# Patient Record
Sex: Female | Born: 1975
Health system: Southern US, Community
[De-identification: ages and names within clinical notes are randomized; demographics above are authoritative.]

## PROBLEM LIST (undated history)

## (undated) DIAGNOSIS — E7800 Pure hypercholesterolemia, unspecified: Secondary | ICD-10-CM

## (undated) DIAGNOSIS — E119 Type 2 diabetes mellitus without complications: Secondary | ICD-10-CM

## (undated) DIAGNOSIS — I10 Essential (primary) hypertension: Secondary | ICD-10-CM

---

## 2011-05-20 ENCOUNTER — Emergency Department (HOSPITAL_COMMUNITY)
Admission: EM | Admit: 2011-05-20 | Discharge: 2011-05-20 | Disposition: A | Discharge status: Home / Self Care | Payer: Medicaid Other | Attending: Emergency Medicine | Admitting: Emergency Medicine

## 2011-05-20 DIAGNOSIS — L03211 Cellulitis of face: Secondary | ICD-10-CM | POA: Insufficient documentation

## 2011-05-20 DIAGNOSIS — E119 Type 2 diabetes mellitus without complications: Secondary | ICD-10-CM | POA: Insufficient documentation

## 2011-05-20 DIAGNOSIS — H60399 Other infective otitis externa, unspecified ear: Secondary | ICD-10-CM | POA: Insufficient documentation

## 2011-05-20 DIAGNOSIS — L0201 Cutaneous abscess of face: Secondary | ICD-10-CM | POA: Insufficient documentation

## 2011-05-20 LAB — URINALYSIS, ROUTINE W REFLEX MICROSCOPIC
Bilirubin Urine: NEGATIVE
Hgb urine dipstick: NEGATIVE
Specific Gravity, Urine: 1.043 — ABNORMAL HIGH (ref 1.005–1.030)
pH: 6 (ref 5.0–8.0)

## 2011-05-20 LAB — URINE MICROSCOPIC-ADD ON

## 2011-05-20 LAB — CBC
HCT: 44.9 % (ref 36.0–46.0)
MCHC: 34.5 g/dL (ref 30.0–36.0)
Platelets: 276 10*3/uL (ref 150–400)
RDW: 12.1 % (ref 11.5–15.5)
WBC: 8.3 10*3/uL (ref 4.0–10.5)

## 2011-05-20 LAB — DIFFERENTIAL
Basophils Absolute: 0 10*3/uL (ref 0.0–0.1)
Basophils Relative: 0 % (ref 0–1)
Eosinophils Absolute: 0.2 10*3/uL (ref 0.0–0.7)
Eosinophils Relative: 2 % (ref 0–5)
Lymphocytes Relative: 27 % (ref 12–46)
Monocytes Absolute: 0.7 10*3/uL (ref 0.1–1.0)

## 2011-05-20 LAB — BASIC METABOLIC PANEL
BUN: 8 mg/dL (ref 6–23)
Calcium: 10.2 mg/dL (ref 8.4–10.5)
GFR calc Af Amer: >90 mL/min (ref >90)
GFR calc non Af Amer: >90 mL/min (ref >90)
Glucose, Bld: 348 mg/dL — ABNORMAL HIGH (ref 70–99)
Potassium: 4 mEq/L (ref 3.5–5.1)
Sodium: 133 mEq/L — ABNORMAL LOW (ref 135–145)

## 2011-05-20 LAB — GLUCOSE, CAPILLARY
Glucose-Capillary: 387 mg/dL — ABNORMAL HIGH (ref 70–99)
Glucose-Capillary: 374 mg/dL — ABNORMAL HIGH (ref 70–99)

## 2011-05-20 LAB — POCT PREGNANCY, URINE: Preg Test, Ur: NEGATIVE

## 2013-01-27 DIAGNOSIS — E1159 Type 2 diabetes mellitus with other circulatory complications: Secondary | ICD-10-CM | POA: Insufficient documentation

## 2013-01-27 DIAGNOSIS — F17200 Nicotine dependence, unspecified, uncomplicated: Secondary | ICD-10-CM | POA: Insufficient documentation

## 2013-01-27 DIAGNOSIS — K047 Periapical abscess without sinus: Secondary | ICD-10-CM | POA: Insufficient documentation

## 2013-01-27 DIAGNOSIS — K120 Recurrent oral aphthae: Secondary | ICD-10-CM | POA: Insufficient documentation

## 2013-01-27 NOTE — ED Notes (Signed)
The pt has had tooth and mouth pain for 3 days and she has been unable to eat until she arrived here now eating pretzels

## 2013-01-28 ENCOUNTER — Emergency Department (HOSPITAL_COMMUNITY)
Admission: EM | Admit: 2013-01-28 | Discharge: 2013-01-28 | Disposition: A | Discharge status: Home / Self Care | Payer: Medicaid Other | Attending: Emergency Medicine | Admitting: Emergency Medicine

## 2013-01-28 ENCOUNTER — Encounter (HOSPITAL_COMMUNITY): Payer: Self-pay | Admitting: *Deleted

## 2013-01-28 DIAGNOSIS — K047 Periapical abscess without sinus: Secondary | ICD-10-CM

## 2013-01-28 HISTORY — DX: Type 2 diabetes mellitus without complications: E11.9

## 2013-01-28 MED ORDER — HYDROCODONE-ACETAMINOPHEN 5-325 MG PO TABS: 1.0000 | ORAL_TABLET | Freq: Four times a day (QID) | ORAL | Status: DC | PRN

## 2013-01-28 MED ORDER — PENICILLIN V POTASSIUM 500 MG PO TABS: 500.0000 mg | ORAL_TABLET | Freq: Four times a day (QID) | ORAL | Status: DC

## 2013-01-28 MED ORDER — HYDROCODONE-ACETAMINOPHEN 5-325 MG PO TABS
1.0000 | ORAL_TABLET | Freq: Once | ORAL | Status: AC
  Administered 2013-01-28: 1 via ORAL
  Filled 2013-01-28: qty 1

## 2013-01-28 MED ORDER — PENICILLIN V POTASSIUM 250 MG PO TABS
500.0000 mg | ORAL_TABLET | Freq: Once | ORAL | Status: AC
  Administered 2013-01-28: 500 mg via ORAL
  Filled 2013-01-28: qty 2

## 2013-01-28 NOTE — ED Provider Notes (Signed)
Medical screening examination/treatment/procedure(s) were performed by non-physician practitioner and as supervising physician I was immediately available for consultation/collaboration.  Lyanne Co, MD 01/28/13 [phone]

## 2013-01-28 NOTE — ED Provider Notes (Signed)
   History    CSN: [id number] Arrival date & time 01/27/13  2341  First MD Initiated Contact with Patient 01/28/13 0005     Chief Complaint  Patient presents with  . Dental Problem   (Consider location/radiation/quality/duration/timing/severity/associated sxs/prior Treatment) HPI Comments: Patient with swollen gum and painful for the past 3 days   The history is provided by the patient.   Past Medical History  Diagnosis Date  . Diabetes mellitus without complication    History reviewed. No pertinent past surgical history. No family history on file. History  Substance Use Topics  . Smoking status: Current Every Day Smoker  . Smokeless tobacco: Not on file  . Alcohol Use: Yes   OB History   Grav Para Term Preterm Abortions TAB SAB Ect Mult Living                 Review of Systems  HENT: Positive for mouth sores and dental problem. Negative for neck pain.   All other systems reviewed and are negative.    Allergies  Review of patient's allergies indicates not on file.  Home Medications   Current Outpatient Rx  Name  Route  Sig  Dispense  Refill  . HYDROcodone-acetaminophen (NORCO/VICODIN) 5-325 MG per tablet   Oral   Take 1 tablet by mouth every 6 (six) hours as needed for pain.   17 tablet   0   . penicillin v potassium (VEETID) 500 MG tablet   Oral   Take 1 tablet (500 mg total) by mouth 4 (four) times daily.   39 tablet   0    BP 99/56  Pulse 100  Temp(Src) 99.4 F (37.4 C)  Resp 20  SpO2 99% Physical Exam  Nursing note and vitals reviewed. Constitutional: She is oriented to person, place, and time. She appears well-developed and well-nourished.  HENT:  Head: Normocephalic.  Mouth/Throat: Oropharynx is clear and moist.    Eyes: Pupils are equal, round, and reactive to light.  Neck: Normal range of motion.  Cardiovascular: Normal rate.   Pulmonary/Chest: Effort normal.  Abdominal: Soft.  Musculoskeletal: Normal range of motion.    Neurological: She is alert and oriented to person, place, and time.  Skin: Skin is warm and dry.    ED Course  Procedures (including critical care time) Labs Reviewed - No data to display No results found. 1. Dental abscess     MDM  Dental abscess   Arman Filter, NP 01/28/13 0100

## 2014-11-10 ENCOUNTER — Encounter (HOSPITAL_COMMUNITY): Payer: Self-pay

## 2014-11-10 ENCOUNTER — Emergency Department (HOSPITAL_COMMUNITY)
Admission: EM | Admit: 2014-11-10 | Discharge: 2014-11-10 | Disposition: A | Discharge status: Home / Self Care | Payer: Medicaid Other | Attending: Emergency Medicine | Admitting: Emergency Medicine

## 2014-11-10 DIAGNOSIS — K047 Periapical abscess without sinus: Secondary | ICD-10-CM | POA: Insufficient documentation

## 2014-11-10 DIAGNOSIS — Z720 Tobacco use: Secondary | ICD-10-CM | POA: Insufficient documentation

## 2014-11-10 DIAGNOSIS — E119 Type 2 diabetes mellitus without complications: Secondary | ICD-10-CM | POA: Insufficient documentation

## 2014-11-10 MED ORDER — IBUPROFEN 800 MG PO TABS
800.0000 mg | ORAL_TABLET | Freq: Three times a day (TID) | ORAL | Status: DC | PRN
Start: 2014-11-10 — End: 2015-04-10

## 2014-11-10 MED ORDER — IBUPROFEN 400 MG PO TABS
600.0000 mg | ORAL_TABLET | Freq: Once | ORAL | Status: AC
  Administered 2014-11-10: 600 mg via ORAL
  Filled 2014-11-10: qty 2

## 2014-11-10 MED ORDER — PENICILLIN V POTASSIUM 500 MG PO TABS: 500.0000 mg | ORAL_TABLET | Freq: Four times a day (QID) | ORAL | Status: AC

## 2014-11-10 MED ORDER — HYDROCODONE-ACETAMINOPHEN 5-325 MG PO TABS: 1.0000 | ORAL_TABLET | ORAL | Status: DC | PRN

## 2014-11-10 NOTE — Discharge Instructions (Signed)
Read the information below.  Use the prescribed medication as directed.  Please discuss all new medications with your pharmacist.  Do not take additional tylenol while taking the prescribed pain medication to avoid overdose.  You may return to the Emergency Department at any time for worsening condition or any new symptoms that concern you.    If there is any possibility that you might be pregnant, please let your health care provider know and discuss this with the pharmacist to ensure medication safety.  Please call the dentist listed above within 48 hours to schedule a close follow up appointment.  If you develop fevers, swelling in your face, difficulty swallowing or breathing, return to the ER immediately for a recheck.   ° ° °Dental Abscess °A dental abscess is a collection of infected fluid (pus) from a bacterial infection in the inner part of the tooth (pulp). It usually occurs at the end of the tooth's root.  °CAUSES  °· Severe tooth decay. °· Trauma to the tooth that allows bacteria to enter into the pulp, such as a broken or chipped tooth. °SYMPTOMS  °· Severe pain in and around the infected tooth. °· Swelling and redness around the abscessed tooth or in the mouth or face. °· Tenderness. °· Pus drainage. °· Bad breath. °· Bitter taste in the mouth. °· Difficulty swallowing. °· Difficulty opening the mouth. °· Nausea. °· Vomiting. °· Chills. °· Swollen neck glands. °DIAGNOSIS  °· A medical and dental history will be taken. °· An examination will be performed by tapping on the abscessed tooth. °· X-rays may be taken of the tooth to identify the abscess. °TREATMENT °The goal of treatment is to eliminate the infection. You may be prescribed antibiotic medicine to stop the infection from spreading. A root canal may be performed to save the tooth. If the tooth cannot be saved, it may be pulled (extracted) and the abscess may be drained.  °HOME CARE INSTRUCTIONS °· Only take over-the-counter or prescription  medicines for pain, fever, or discomfort as directed by your caregiver. °· Rinse your mouth (gargle) often with salt water (¼ tsp salt in 8 oz [250 ml] of warm water) to relieve pain or swelling. °· Do not drive after taking pain medicine (narcotics). °· Do not apply heat to the outside of your face. °· Return to your dentist for further treatment as directed. °SEEK MEDICAL CARE IF: °· Your pain is not helped by medicine. °· Your pain is getting worse instead of better. °SEEK IMMEDIATE MEDICAL CARE IF: °· You have a fever or persistent symptoms for more than 2-3 days. °· You have a fever and your symptoms suddenly get worse. °· You have chills or a very bad headache. °· You have problems breathing or swallowing. °· You have trouble opening your mouth. °· You have swelling in the neck or around the eye. °Document Released: 07/18/2005 Document Revised: 04/11/2012 Document Reviewed: 10/26/2010 °ExitCare® Patient Information ©2015 [employer]. This information is not intended to replace advice given to you by your health care provider. Make sure you discuss any questions you have with your health care provider. ° ° ° °Emergency Department Resource Guide °1) Find a Doctor and Pay Out of Pocket °Although you won't have to find out who is covered by your insurance plan, it is a good idea to ask around and get recommendations. You will then need to call the office and see if the doctor you have chosen will accept you as a new patient and   what types of options they offer for patients who are self-pay. Some doctors offer discounts or will set up payment plans for their patients who do not have insurance, but you will need to ask so you aren't surprised when you get to your appointment. ° °2) Contact Your Local Health Department °Not all health departments have doctors that can see patients for sick visits, but many do, so it is worth a call to see if yours does. If you don't know where your local health department is, you  can check in your phone book. The CDC also has a tool to help you locate your state's health department, and many state websites also have listings of all of their local health departments. ° °3) Find a Walk-in Clinic °If your illness is not likely to be very severe or complicated, you may want to try a walk in clinic. These are popping up all over the country in pharmacies, drugstores, and shopping centers. They're usually staffed by nurse practitioners or physician assistants that have been trained to treat common illnesses and complaints. They're usually fairly quick and inexpensive. However, if you have serious medical issues or chronic medical problems, these are probably not your best option. ° °No Primary Care Doctor: °- Call [facility] at  832-8000 - they can help you locate a primary care doctor that  accepts your insurance, provides certain services, etc. °- Physician Referral Service- 1-800-533-3463 ° °Chronic Pain Problems: °Organization         Address  Phone   Notes  °[facility]  (336) 297-2271 Patients need to be referred by their primary care doctor.  ° °Medication Assistance: °Organization         Address  Phone   Notes  °[place] [facility] [address] °[place], [place] [address] (336) 641-8030 --Must be a resident of [place] °-- Must have NO insurance coverage whatsoever (no Medicaid/ Medicare, etc.) °-- The pt. MUST have a primary care doctor that directs their care regularly and follows them in the community °  °[facility]  (866) 331-1348   °[employer]  (888) 892-1162   ° °Agencies that provide inexpensive medical care: °Organization         Address  Phone   Notes  °[facility]  (336) 832-8035   °[facility]    (336) 832-7272   °[facility] [address] °[place],  [address] (336) 832-4777   °[facility] [place] [address], °[place] (336)  271-4999   °[facility]    (336) 373-0678   °[facility]    (336) 272-1050   °[facility] ° [address], [place] Phone:  (336) 832-4444, Fax:  (336) 832-4440 Hours of Operation:  9 am - 6 pm, M-F.  Also accepts Medicaid/Medicare and self-pay.  °[facility] ° [address],  Phone: (336) 832-3150, Fax: (336) 832-3151. Hours of Operation:  8:30 am - 5:30 pm, M-F.  Also accepts Medicaid and self-pay.  °[facility] [place] [address], [place] Phone: (336) 878-6027   °[facility] [address], [place],  (336)723-1848, Ext. 123 Mondays & Thursdays: 7-9 AM.  First 15 patients are seen on a first come, first serve basis. °  ° °Medicaid-accepting [place] Providers: ° °Organization         Address  Phone     Notes  °[facility] [address], [place] (336) 641-2100 Also accepts self-pay patients.  °[facility] [address], [place] ° (336) 856-9996   °[facility] [address], [place] (336) 288-8857   °[facility] [address] [place] [address], [place] (336) 299-7000   °Veita Bland [address], [place]  ° (336) 373-1557 Only accepts [place] Access Medicaid patients after they have their name applied to their card.  ° °Self-Pay (no insurance) in [place]: ° °Organization         Address  Phone   Notes  °Sickle Cell Patients, [facility] [address], [place] (336) 832-1970   °[facility] [address], [place] (336) 832-4400   °[facility] ° [address], [place] (336) 992-4800   °[facility]/Dr. Osei-Bonsu ° [address] [place] [address], [place] or [address], [place] (336) 841-8500 Phone number for both [place] and [place] locations  is the same.  °[facility] [address], [place] (336) 299-0000   °[facility] [address] [place] [address], [place] or [address] (336) 852-7530 °(336) 878-2260   °[facility] [address], [place] (336) 350-1642, phone; (336) 294-5005, fax Sees patients 1st and 3rd Saturday of every month.  Must not qualify for public or private insurance (i.e. Medicaid, Medicare, [place] Health Choice, Veterans' Benefits) • Household income should be no more than 200% of the poverty level •The clinic cannot treat you if you are pregnant or think you are pregnant • Sexually transmitted diseases are not treated at the clinic.  ° ° °Dental Care: °Organization         Address  Phone  Notes  °[facility] [address], [place] (336) 641-6152 Accepts children up to age 21 who are enrolled in Medicaid or [place] Health Choice; pregnant women with a Medicaid card; and children who have applied for Medicaid or [place] Health Choice, but were declined, whose parents can pay a reduced fee at time of service.  °[facility] [place]  [address], [place] (336) 641-7733 Accepts children up to age 21 who are enrolled in Medicaid or [place] Health Choice; pregnant women with a Medicaid card; and children who have applied for Medicaid or [place] Health Choice, but were declined, whose parents can pay a reduced fee at time of service.  °[facility] ° [address], [place] (336) 641-4533 Patients are seen by appointment only. Walk-ins are not accepted. [facility] will see patients 18 years of age and older. °Monday - Tuesday (8am-5pm) °Most Wednesdays (8:30-5pm) °$30 per visit, cash only  °[facility] ° [address], [place] (336) 641-4533 Patients are seen by appointment only. Walk-ins are not accepted. [facility] will see  patients 18 years of age and older. °One Wednesday Evening (Monthly: Volunteer Based).  $30 per visit, cash only  °[facility]  (919) 537-3737 for adults; Children under age 4, call Graduate Pediatric Dentistry at (919) [phone]. Children aged 4-14, please call (919) 537-3737 to request a pediatric application. ° Dental services are provided in all areas of dental care including fillings, crowns and bridges, complete   and partial dentures, implants, gum treatment, root canals, and extractions. Preventive care is also provided. Treatment is provided to both adults and children. °Patients are selected via a lottery and there is often a waiting list. °  °[facility] [address], °[place] ° (336) 763-8833 [email or web address] °  °[facility] [address], [place], [place] (336)723-1848, Ext. 123 Second and Fourth Thursday of each month, opens at 6:30 AM; Clinic ends at 9 AM.  Patients are seen on a first-come first-served basis, and a limited number are seen during each clinic.  ° °[facility] ° [address], [place], [place] (336) 723-7904   Eligibility Requirements °You must have lived in [place], [place], or [place] counties for at least the last three months. °  You cannot be eligible for state or federal sponsored healthcare insurance, including Veterans Administration, Medicaid, or Medicare. °  You generally cannot be eligible for healthcare insurance through your employer.  °  How to apply: °Eligibility screenings are held every Tuesday and Wednesday afternoon from 1:00 pm until 4:00 pm. You do not need an appointment for the interview!  °[facility] [address], [place], [place] 336-631-2330   °[facility]  336-342-8273   °[facility]  336-703-3100   °[facility]  336-570-6415   ° °Behavioral Health Resources in the Community: °Intensive Outpatient  Programs °Organization         Address  Phone  Notes  °[facility] [address], [place], [place] 336-878-6098   °[facility] [address], [place], [place] 336-832-9800   °[facility] [address], [place], [place] ° 336-882-2125   °[facility] [address],  °[place], [place] 1-800-853-5163 or 336-641-4981   °Substance Abuse Resources °Organization         Address  Phone  Notes  °[facility]  336-882-2125   °[facility]  336-784-9470   °[facility]  336-285-9073   °[facility]  336-845-3988   °Residential & Outpatient Substance Abuse Program  1-800-659-3381   °Psychological Services °Organization         Address  Phone  Notes  °[facility]  336- 832-9600   °[facility]  336- 378-7881   °[facility] [address], [place] 1-800-853-5163 or 336-641-4981   ° °Mobile Crisis Teams °Organization         Address  Phone  Notes  °[facility], Mobile Crisis Care Unit  1-877-626-1772   °Assertive °[facility] ° [address] [place], [place] 336-834-9664   °Sharon DeEsch [address] °[place] 336-554-5454   ° °Self-Help/Support Groups °Organization         Address  Phone             Notes  °[facility] - variety of support groups  336- 373-1402 Call for more information  °Narcotics Anonymous (NA), [facility] [address], °[place]  2 meetings at this location  ° °Residential Treatment Programs °Organization         Address  Phone  Notes  °[facility] [address],    ° [place]  1-866-801-8205   °[facility] ° [address], [place], [place] 704-293-8524   °[facility] [address], [place] 336-845-3988 Admissions: 8am-3pm M-F  °  [facility] [address]    °[place], [place]  336-841-1104   °[facility] [address], [place], [place] 336-379-7146   °[facility] [address]  °[place], [place] 336-285-9073   °[facility] - Intensive Outpatient [address], [place], [place] 336-852-3033   °[facility] [address]  °[place], [place] 1-877-615-2722 or 336-784-9470   °[facility] [address] [place], [place] 336-227-7417 Accepts Medicaid  °[facility] [address]  °[place] 1-800-659-3381 Substance Abuse/Addiction Treatment  ° °[place] Behavioral Health Resources °Organization         Address  Phone  Notes  °[facility]  (888) 581-9988   °Julie Brannon, PhD [address] [place], [place]   (336) 349-5553 or (336) 951-0000   ° Behavioral   [address] °[place], [place] (336) 349-4454   °[facility] [address], [place], [place] (336) 342-8316 Insurance/Medicaid/sponsorship through [facility]  °[facility] [address]                                    [place], [place] (336) 342-8316 Therapy/tele-psych/case  °[facility] [address]  ° [place],  (336) 349-2233    °Dr. Arfeen  (336) 349-4544   °[facility] [place]  [employer] [place] [facility] 1) [address], [place] °2) [address], [place] °3)  [address], [place] (336) 349-3220 °(336) 342-7768 ° °(336) 342-8140   °[place] Child Abuse [facility] (336) 342-1394 or (336) 342-3537 (After Hours)    ° ° ° °

## 2014-11-10 NOTE — ED Provider Notes (Signed)
CSN: [id number]     Arrival date & time 11/10/14  1608 History   First MD Initiated Contact with Patient 11/10/14 1624     Chief Complaint  Patient presents with  . Dental Pain     (Consider location/radiation/quality/duration/timing/severity/associated sxs/prior Treatment) HPI   Pt with hx DM p/w left lower dental pain that began yesterday.  States she has severe pain involving her wisdom tooth and associated facial swelling.  Describes the pain as being unable to eat on that side.  Denies fevers,sore throat, difficulty swallowing or breathing.   Otherwise is feeling well.  Has taken nothing for her symptoms.  Has had an infection of this tooth before and is working on getting a Education officer, community, does not have Secretary/administrator.   Past Medical History  Diagnosis Date  . Diabetes mellitus without complication    History reviewed. No pertinent past surgical history. No family history on file. History  Substance Use Topics  . Smoking status: Current Every Day Smoker  . Smokeless tobacco: Not on file  . Alcohol Use: Yes     Comment: occasionally   OB History    No data available     Review of Systems  Constitutional: Negative for fever and chills.  HENT: Negative for sore throat and trouble swallowing.   Respiratory: Negative for shortness of breath.   Gastrointestinal: Negative for nausea, vomiting and abdominal pain.  Musculoskeletal: Negative for neck pain and neck stiffness.  Allergic/Immunologic: Negative for immunocompromised state.  Hematological: Does not bruise/bleed easily.  Psychiatric/Behavioral: Negative for self-injury.      Allergies  Review of patient's allergies indicates no known allergies.  Home Medications   Prior to Admission medications   Medication Sig Start Date End Date Taking? Authorizing Provider  HYDROcodone-acetaminophen (NORCO/VICODIN) 5-325 MG per tablet Take 1 tablet by mouth every 4 (four) hours as needed for severe pain. 11/10/14   Trixie Dredge,  PA-C  ibuprofen (ADVIL,MOTRIN) 800 MG tablet Take 1 tablet (800 mg total) by mouth every 8 (eight) hours as needed for mild pain or moderate pain. 11/10/14   Trixie Dredge, PA-C  penicillin v potassium (VEETID) 500 MG tablet Take 1 tablet (500 mg total) by mouth 4 (four) times daily. 11/10/14 11/17/14  Trixie Dredge, PA-C   BP 125/80 mmHg  Pulse 102  Temp(Src) 98.4 F (36.9 C) (Oral)  Resp 15  SpO2 100%  LMP 11/07/2014 Physical Exam  Constitutional: She appears well-developed and well-nourished. No distress.  HENT:  Head: Normocephalic and atraumatic.  Mouth/Throat: Uvula is midline and oropharynx is clear and moist. Mucous membranes are not dry. No uvula swelling. No oropharyngeal exudate, posterior oropharyngeal edema, posterior oropharyngeal erythema or tonsillar abscesses.    Neck: Trachea normal, normal range of motion and phonation normal. Neck supple. No tracheal tenderness present. No rigidity. No tracheal deviation, no edema, no erythema and normal range of motion present.  Cardiovascular: Normal rate.   Pulmonary/Chest: Effort normal and breath sounds normal. No stridor.  Lymphadenopathy:    She has no cervical adenopathy.  Neurological: She is alert.  Skin: She is not diaphoretic.  Nursing note and vitals reviewed.   ED Course  Procedures (including critical care time) Labs Review Labs Reviewed - No data to display  Imaging Review No results found.   EKG Interpretation None      MDM   Final diagnoses:  Dental abscess    Afebrile, nontoxic patient with new dental pain with obvious abscess. No airway concerns. Doubt Ludwig's angina.  D/C  home with antibiotic, pain medication and dental follow up.  Multiple resources given.  Discussed findings, treatment, and follow up  with patient.  Pt given return precautions.  Pt verbalizes understanding and agrees with plan.         Trixie Dredgemily Chiron Campione, PA-C 11/10/14 1714  Mancel BaleElliott [place], MD 11/11/14 [phone]

## 2014-11-10 NOTE — ED Notes (Signed)
Pt c/o left lower molar pain starting today; has had intermittent pain in same tooth in the past. Tooth intact

## 2014-11-10 NOTE — ED Notes (Signed)
Pt. Reports left lower dental pain starting last night.

## 2015-04-10 ENCOUNTER — Ambulatory Visit (INDEPENDENT_AMBULATORY_CARE_PROVIDER_SITE_OTHER): Payer: Worker's Compensation | Admitting: Physician Assistant

## 2015-04-10 VITALS — BP 100/68 | HR 95 | Temp 98.4°F | Resp 16 | Ht 63.0 in | Wt 141.0 lb

## 2015-04-10 DIAGNOSIS — S50812A Abrasion of left forearm, initial encounter: Secondary | ICD-10-CM | POA: Diagnosis not present

## 2015-04-10 DIAGNOSIS — Z23 Encounter for immunization: Secondary | ICD-10-CM

## 2015-04-10 NOTE — Patient Instructions (Signed)
Keep wound clean and dry. Return if you develop fever, chills, increased redness, increased pain or drainage.

## 2015-04-10 NOTE — Progress Notes (Signed)
[facility] [address] [place] [address] [id number]  Date:  04/10/2015   Name:  Anne Wright   DOB:  1975-08-17   MRN:  [id number]  PCP:  No PCP Per Patient    Chief Complaint: Abrasion   History of Present Illness:  This is a 39 y.o. female who is presenting with a scratch on her left forearm that occurred today at work. 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 The hand that scratched her was non-bloody. She has thoroughly washed the wound. No pain at the site. She denies fever or chills. She is not up to date on tetanus.  Review of Systems:  Review of Systems See HPI  There are no active problems to display for this patient.   Prior to Admission medications   Not on File    No Known Allergies  Medication list has been reviewed and updated   Physical Examination:  Physical Exam  Constitutional: She is oriented to person, place, and time. She appears well-developed and well-nourished. No distress.  HENT:  Head: Normocephalic and atraumatic.  Right Ear: Hearing normal.  Left Ear: Hearing normal.  Nose: Nose normal.  Eyes: Conjunctivae and lids are normal. Right eye exhibits no discharge. Left eye exhibits no discharge. No scleral icterus.  Pulmonary/Chest: Effort normal. No respiratory distress.  Musculoskeletal: Normal range of motion.  Neurological: She is alert and oriented to person, place, and time.  Skin: Skin is warm and dry.  Left forearm with thin scratch about 5 cm length. Already with scab formation. Local erythema present. No warmth or drainage.  Psychiatric: She has a normal mood and affect. Her speech is normal and behavior is normal. Thought content normal.   BP 100/68 mmHg  Pulse 95  Temp(Src) 98.4 F (36.9 C) (Oral)  Resp 16  Ht  (1.6 m)  Wt 141 lb (63.957 kg)  BMI 24.98 kg/m2  SpO2 99%  LMP 04/06/2015  Assessment and Plan:  1. Scratch of forearm, left, initial encounter 2.  Need for Tdap vaccination Simple scratch on forearm. Discussed disease transmission -- no risk with a scratch from a non-bloody hand. Tdap given. Instructed to keep wound clean and dry. Discussed return precautions. - Tdap vaccine greater than or equal to 7yo IM   [place] Miners. Dyke Brackett, MHS [facility]  04/10/2015

## 2017-04-21 ENCOUNTER — Encounter (HOSPITAL_COMMUNITY): Payer: Self-pay | Admitting: Obstetrics and Gynecology

## 2018-09-06 ENCOUNTER — Encounter: Payer: Self-pay | Admitting: Internal Medicine

## 2018-10-08 ENCOUNTER — Ambulatory Visit: Payer: Self-pay | Admitting: Internal Medicine

## 2018-10-08 ENCOUNTER — Encounter: Payer: Self-pay | Admitting: Internal Medicine

## 2018-10-08 ENCOUNTER — Ambulatory Visit (INDEPENDENT_AMBULATORY_CARE_PROVIDER_SITE_OTHER): Payer: PRIVATE HEALTH INSURANCE | Admitting: Internal Medicine

## 2018-10-08 ENCOUNTER — Other Ambulatory Visit: Payer: Self-pay

## 2018-10-08 VITALS — BP 124/84 | HR 101 | Ht 62.0 in | Wt 134.2 lb

## 2018-10-08 DIAGNOSIS — E1165 Type 2 diabetes mellitus with hyperglycemia: Secondary | ICD-10-CM | POA: Insufficient documentation

## 2018-10-08 DIAGNOSIS — E0865 Diabetes mellitus due to underlying condition with hyperglycemia: Secondary | ICD-10-CM | POA: Diagnosis not present

## 2018-10-08 LAB — GLUCOSE, POCT (MANUAL RESULT ENTRY): POC Glucose: 311 mg/dl — AB (ref 70–99)

## 2018-10-08 MED ORDER — INSULIN GLARGINE 100 UNIT/ML SOLOSTAR PEN: 10.0000 [IU] | PEN_INJECTOR | Freq: Every day | SUBCUTANEOUS | 3 refills | Status: DC

## 2018-10-08 MED ORDER — DULAGLUTIDE 1.5 MG/0.5ML ~~LOC~~ SOAJ: 1.5000 mg | SUBCUTANEOUS | 6 refills | Status: DC

## 2018-10-08 MED ORDER — INSULIN PEN NEEDLE 32G X 4 MM MISC: 3 refills | Status: DC

## 2018-10-08 NOTE — Progress Notes (Signed)
Name: Anne Wright  MRN/ DOB: [id number], Oct 24, 1975   Age/ Sex: 43 y.o., female    PCP: Levonne Lapping, NP   Reason for Endocrinology Evaluation: Type 2 Diabetes Mellitus     Date of Initial Endocrinology Visit: 10/08/2018     PATIENT IDENTIFIER: Ms. Anne Wright is a 43 y.o. female with a past medical history of T2DM. The patient presented for initial endocrinology clinic visit on 10/08/2018 for consultative assistance with Anne Wright diabetes management.    HPI: Anne Wright was    Diagnosed with T2DM for  yrs Prior Medications tried/Intolerance: Has been on Metformin since diagnosis, was on insulin at some times. Has been on trulicity for ~  Yrs.  Currently checking blood sugars 0 x / day. Hypoglycemia episodes : no            Hemoglobin A1c has ranged from 7.8 %in  2018, peaking at 10.8% in 08/2018. Patient required assistance for hypoglycemia: no Patient has required hospitalization within the last 1 year from hyper or hypoglycemia: no  In terms of diet, the patient drinks diet sodas and juice, eats 2 meals, snacks 3x a day.   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 Diabetes mellitus without complication [facility])     Past Surgical History: No past surgical history on file.   Social History:  reports that she quit smoking about 3 months ago. She has never used smokeless tobacco. She reports current alcohol use. She reports that she does not use drugs. Family History:  Family History  Problem Relation Age of Onset  .  Diabetes Mother   . Hypertension Mother   . Asthma Son      HOME MEDICATIONS: Allergies as of 10/08/2018   No Known Allergies     Medication List       Accurate as of October 08, 2018 11:40 AM. Always use your most recent med list.        aspirin EC 81 MG tablet Take 81 mg by mouth daily.   atorvastatin 10 MG tablet Commonly known as:  LIPITOR TK 1 T PO QD   metFORMIN 500 MG tablet Commonly known as:  GLUCOPHAGE TK 1 T PO BID WAC   OneTouch Delica Plus Lancet33G Misc 2 (two) times daily. for testing   OneTouch Verio test strip Generic drug:  glucose blood USE TO TEST BLOOD SUGAR LEVELS 2 TIMES DAILY   Trulicity 0.75 MG/0.5ML Sopn Generic drug:  Dulaglutide Trulicity 0.75 mg/0.5 mL subcutaneous pen injector        ALLERGIES: No Known Allergies   REVIEW OF SYSTEMS: A comprehensive ROS was conducted with the patient and is negative except as per HPI and below:  Review of Systems  Constitutional: Negative for fever.  HENT: Negative for congestion and sore throat.   Eyes: Positive for blurred vision. Negative for pain.  Respiratory: Negative for cough and shortness of breath.   Cardiovascular: Negative for chest pain and palpitations.  Gastrointestinal: Negative for diarrhea and nausea.  Genitourinary: Positive for frequency.  Skin: Negative.   Neurological: Negative for tingling and tremors.  Endo/Heme/Allergies: Positive for polydipsia.  Psychiatric/Behavioral: Negative for depression. The patient is nervous/anxious.       OBJECTIVE:   VITAL SIGNS: BP 124/84 (BP Location: Left Arm, Patient Position: Sitting, Cuff Size: Normal)   Pulse (!) 101   Ht 5\' 2"  (1.575 m)   Wt 134 lb 3.2 oz (60.9 kg)   SpO2 97%   BMI 24.55 kg/m    PHYSICAL EXAM:  General: Pt appears well and is in NAD  Hydration: Well-hydrated with moist mucous membranes and good skin turgor  HEENT: Head: Unremarkable with good dentition. Oropharynx clear without exudate.  Eyes: External  eye exam normal without stare, lid lag or exophthalmos.  EOM intact.  PERRL.  Neck: General: Supple without adenopathy or carotid bruits. Thyroid: Thyroid size normal.  No goiter or nodules appreciated. No thyroid bruit.  Lungs: Clear with good BS bilat with no rales, rhonchi, or wheezes  Heart: RRR with normal S1 and S2 and no gallops; no murmurs; no rub  Abdomen: Normoactive bowel sounds, soft, nontender, without masses or organomegaly palpable  Extremities:  Lower extremities - No pretibial edema. No lesions.  Skin: Normal texture and temperature to palpation. No rash noted. No Acanthosis nigricans/skin tags. No lipohypertrophy.  Neuro: MS is good with appropriate affect, pt is alert and Ox3    DM foot exam: 10/08/2018 The skin of the feet is without sores or ulcerations, patient with plantar callous formation bilaterally.  The pedal pulses are 2+ on right and 2+ on left. The sensation is intact to a screening 5.07, 10 gram monofilament bilaterally   DATA REVIEWED: 08/27/2018  A1c 10.8 %      In-Office 311 mg/dl    ASSESSMENT / PLAN / RECOMMENDATIONS:   1) Type 2 Diabetes Mellitus, Poorly controlled, Without complications - Most recent A1c of 10.8 %. Goal A1c < 7.0 %.    Plan: GENERAL: I have discussed with the patient the pathophysiology of diabetes. We went over the natural progression of the disease. We talked about both insulin resistance and insulin deficiency. We stressed the importance of lifestyle changes including diet and exercise. I explained the complications associated with diabetes including retinopathy, nephropathy, neuropathy as well as increased risk of cardiovascular disease. We went over the benefit seen with glycemic control.    I explained to the patient that diabetic patients are at higher than normal risk for amputations. The patient was informed that diabetes is the number one cause of non-traumatic amputations in [place].   We discussed  The effects of  constant snacking and eating on glucose control, we discussed low-carb options for snack.  We discussed the importance of glucose data availability  I have advised Anne Wright that insulin would be the preferred method at this point to lower Anne Wright glucose, due to severe hypoglycemia.  We have discussed that in the future I would like to change Anne Wright metformin from the regular release to the extended release for better tolerability.  Will check GAD- 65 as she does not exhibit the typical T2DM features.    MEDICATIONS:  Start lantus at 10 units QHS  Increase Trulicity to 1.5 mg weekly   Continue Metformin 500 mg with Supper   EDUCATION / INSTRUCTIONS:  BG monitoring instructions: Patient is instructed to check Anne Wright blood sugars 2 times a day, fasting and bedtime.  Call [place] [facility] if: BG persistently < 70 or > 300. . I reviewed the Rule of 15  for the treatment of hypoglycemia in detail with the patient. Literature supplied.   2) Diabetic complications:   Eye: Does not have known diabetic retinopathy.   Neuro/ Feet: Does not have known diabetic peripheral neuropathy.  Renal: Patient does not have known baseline CKD. She is not on an ACEI/ARB at present.   3) Lipids: Patient is on a statin.    Follow-up in 6 weeks      Signed electronically by: Lyndle Herrlich, MD  [facility] Endocrinology  [facility] [address] [place], [place] [address] Phone: [phone] FAX: [phone]   CC: Levonne Lapping, NP [address] [place] [address] Phone: [phone]  Fax: [phone]    Return to Endocrinology clinic as below: No future appointments.

## 2018-10-08 NOTE — Progress Notes (Deleted)
Name: Anne Wright  MRN/ DOB: [id number], 1976-04-06   Age/ Sex: 43 y.o., female    PCP: Patient, No Pcp Per   Reason for Endocrinology Evaluation: Type 2 Diabetes Mellitus     Date of Initial Endocrinology Visit: 10/08/2018     PATIENT IDENTIFIER: Anne Wright is a 43 y.o. female with a past medical history of T2DM. The patient presented for initial endocrinology clinic visit on 10/08/2018 for consultative assistance with her diabetes management.    HPI: Anne Wright was    Diagnosed with DM *** Prior Medications tried/Intolerance: *** Currently checking blood sugars *** x / day,  before breakfast and ***.  Hypoglycemia episodes : ***               Symptoms: ***                 Frequency: ***/  Hemoglobin A1c has ranged from *** in ***, peaking at *** in***. Patient required assistance for hypoglycemia:  Patient has required hospitalization within the last 1 year from hyper or hypoglycemia:   In terms of diet, the patient ***   HOME DIABETES REGIMEN: Metformin  Trulicity    Statin: {Yes/No:11203} ACE-I/ARB: {YES/NO:17245} Prior Diabetic Education: {Yes/No:11203}   METER DOWNLOAD SUMMARY: Date range evaluated: *** Fingerstick Blood Glucose Tests = *** Average Number Tests/Day = *** Overall Mean FS Glucose = *** Standard Deviation = ***  BG Ranges: Low = *** High = ***   Hypoglycemic Events/30 Days: BG < 50 = *** Episodes of symptomatic severe hypoglycemia = ***   DIABETIC COMPLICATIONS: Microvascular complications:   ***  Denies: ***  Last eye exam: Completed   Macrovascular complications:   ***  Denies: CAD, PVD, CVA   PAST HISTORY: Past Medical History:  Past Medical History:  Diagnosis Date  . Diabetes mellitus without complication [facility])      Past Surgical History: No past surgical history on file.   Social History:  reports that she has been smoking. She does not have any smokeless tobacco history on file. She reports  current alcohol use. She reports that she does not use drugs. Family History:  Family History  Problem Relation Age of Onset  . Diabetes Mother   . Hypertension Mother   . Asthma Son       HOME MEDICATIONS: Allergies as of 10/08/2018   No Known Allergies     Medication List    as of October 08, 2018  7:45 AM   You have not been prescribed any medications.      ALLERGIES: No Known Allergies   REVIEW OF SYSTEMS: A comprehensive ROS was conducted with the patient and is negative except as per HPI and below:  ROS    OBJECTIVE:   VITAL SIGNS: There were no vitals taken for this visit.   PHYSICAL EXAM:  General: Pt appears well and is in NAD  Hydration: Well-hydrated with moist mucous membranes and good skin turgor  HEENT: Head: Unremarkable with good dentition. Oropharynx clear without exudate.  Eyes: External eye exam normal without stare, lid lag or exophthalmos.  EOM intact.  PERRL.  Neck: General: Supple without adenopathy or carotid bruits. Thyroid: Thyroid size normal.  No goiter or nodules appreciated. No thyroid bruit.  Lungs: Clear with good BS bilat with no rales, rhonchi, or wheezes  Heart: RRR with normal S1 and S2 and no gallops; no murmurs; no rub  Abdomen: Normoactive bowel sounds, soft, nontender, without masses or organomegaly palpable  Extremities:  Lower extremities - No pretibial edema. No lesions.  Skin: Normal texture and temperature to palpation. No rash noted. No Acanthosis nigricans/skin tags. No lipohypertrophy.  Neuro: MS is good with appropriate affect, pt is alert and Ox3    DM foot exam:    DATA REVIEWED:  No results found for: HGBA1C Lab Results  Component Value Date   CREATININE 0.54 05/20/2011   No results found for: MICRALBCREAT  No results found for: CHOL, HDL, LDLCALC, LDLDIRECT, TRIG, CHOLHDL      ASSESSMENT / PLAN / RECOMMENDATIONS:   1) Type *** Diabetes Mellitus, ***controlled, With*** complications - Most recent A1c  of *** %. Goal A1c < *** %.  ***  Plan: GENERAL:  ***  MEDICATIONS:  ***  EDUCATION / INSTRUCTIONS:  BG monitoring instructions: Patient is instructed to check her blood sugars *** times a day, ***.  Call [place] [facility] clinic if: BG persistently < 70 or > 300. . I reviewed the Rule of 15 for the treatment of hypoglycemia in detail with the patient. Literature supplied.   2) Diabetic complications:   Eye: Does *** have known diabetic retinopathy.   Neuro/ Feet: Does *** have known diabetic peripheral neuropathy.  Renal: Patient does *** have known baseline CKD. She is *** on an ACEI/ARB at present.   3) Lipids: Patient is *** on a statin.    4) Hypertension: ***  at goal of < 140/90 mmHg.       Signed electronically by: Lyndle Herrlich, MD  [facility] Endocrinology  [facility] [address] [place], [place] [address] Phone: [phone] FAX: [phone]   CC: Patient, No Pcp Per No address on file Phone: None  Fax: None    Return to Endocrinology clinic as below: Future Appointments  Date Time Provider Department Center  10/08/2018  9:50 AM Beuna Bolding, Konrad Dolores, MD [id number] None

## 2018-10-08 NOTE — Patient Instructions (Addendum)
-   Start Lantus at 10 units daily  - Increase Trulicity to 1.5 mg weekly  - Continue Metformin 500 mg with supper  - Check sugar fasting and bedtime - Bring meter on next visit     - Choose healthy, lower carb lower calorie snacks: toss salad, cooked vegetables, cottage cheese, peanut butter, low fat cheese / string cheese, lower sodium deli meat, tuna salad or chicken salad     HOW TO TREAT LOW BLOOD SUGARS (Blood sugar LESS THAN 70 MG/DL)  Please follow the RULE OF 15 for the treatment of hypoglycemia treatment (when your (blood sugars are less than 70 mg/dL)    STEP 1: Take 15 grams of carbohydrates when your blood sugar is low, which includes:   3-4 GLUCOSE TABS  OR  3-4 OZ OF JUICE OR REGULAR SODA OR  ONE TUBE OF GLUCOSE GEL     STEP 2: RECHECK blood sugar in 15 MINUTES STEP 3: If your blood sugar is still low at the 15 minute recheck --> then, go back to STEP 1 and treat AGAIN with another 15 grams of carbohydrates.

## 2018-10-11 LAB — GLUTAMIC ACID DECARBOXYLASE AUTO ABS: Glutamic Acid Decarb Ab: <5 IU/mL (ref <5)

## 2018-11-19 ENCOUNTER — Ambulatory Visit: Payer: PRIVATE HEALTH INSURANCE | Admitting: Dietician

## 2018-11-19 ENCOUNTER — Ambulatory Visit: Payer: PRIVATE HEALTH INSURANCE | Admitting: Internal Medicine

## 2018-12-19 ENCOUNTER — Ambulatory Visit: Payer: PRIVATE HEALTH INSURANCE | Admitting: Internal Medicine

## 2019-10-24 DIAGNOSIS — Z0001 Encounter for general adult medical examination with abnormal findings: Secondary | ICD-10-CM | POA: Diagnosis not present

## 2019-10-24 DIAGNOSIS — E119 Type 2 diabetes mellitus without complications: Secondary | ICD-10-CM | POA: Diagnosis not present

## 2019-10-24 DIAGNOSIS — Z7984 Long term (current) use of oral hypoglycemic drugs: Secondary | ICD-10-CM | POA: Diagnosis not present

## 2019-10-24 DIAGNOSIS — E782 Mixed hyperlipidemia: Secondary | ICD-10-CM | POA: Diagnosis not present

## 2019-10-24 DIAGNOSIS — F332 Major depressive disorder, recurrent severe without psychotic features: Secondary | ICD-10-CM | POA: Diagnosis not present

## 2019-10-24 DIAGNOSIS — F411 Generalized anxiety disorder: Secondary | ICD-10-CM | POA: Diagnosis not present

## 2019-10-24 LAB — LIPID PANEL
Cholesterol: 243 — AB (ref 0–200)
HDL: 58 (ref 35–70)
LDL Cholesterol: 151
Triglycerides: 191 — AB (ref 40–160)

## 2019-10-24 LAB — BASIC METABOLIC PANEL
BUN: 18 (ref 4–21)
Creatinine: 0.6 (ref 0.5–1.1)

## 2019-10-24 LAB — HEMOGLOBIN A1C: Hemoglobin A1C: 11.4

## 2019-11-14 DIAGNOSIS — Z1231 Encounter for screening mammogram for malignant neoplasm of breast: Secondary | ICD-10-CM | POA: Diagnosis not present

## 2019-11-14 DIAGNOSIS — E1165 Type 2 diabetes mellitus with hyperglycemia: Secondary | ICD-10-CM | POA: Diagnosis not present

## 2019-11-14 DIAGNOSIS — Z124 Encounter for screening for malignant neoplasm of cervix: Secondary | ICD-10-CM | POA: Diagnosis not present

## 2019-11-14 DIAGNOSIS — F419 Anxiety disorder, unspecified: Secondary | ICD-10-CM | POA: Insufficient documentation

## 2019-11-14 DIAGNOSIS — F32A Depression, unspecified: Secondary | ICD-10-CM | POA: Insufficient documentation

## 2019-11-14 DIAGNOSIS — Z6824 Body mass index (BMI) 24.0-24.9, adult: Secondary | ICD-10-CM | POA: Diagnosis not present

## 2019-11-14 DIAGNOSIS — N898 Other specified noninflammatory disorders of vagina: Secondary | ICD-10-CM | POA: Diagnosis not present

## 2019-11-14 DIAGNOSIS — Z01419 Encounter for gynecological examination (general) (routine) without abnormal findings: Secondary | ICD-10-CM | POA: Diagnosis not present

## 2019-11-20 ENCOUNTER — Other Ambulatory Visit: Payer: Self-pay | Admitting: Obstetrics and Gynecology

## 2019-11-20 DIAGNOSIS — N6489 Other specified disorders of breast: Secondary | ICD-10-CM

## 2019-11-26 DIAGNOSIS — Z794 Long term (current) use of insulin: Secondary | ICD-10-CM | POA: Diagnosis not present

## 2019-11-26 DIAGNOSIS — E119 Type 2 diabetes mellitus without complications: Secondary | ICD-10-CM | POA: Diagnosis not present

## 2019-11-26 DIAGNOSIS — F332 Major depressive disorder, recurrent severe without psychotic features: Secondary | ICD-10-CM | POA: Diagnosis not present

## 2019-11-26 DIAGNOSIS — E782 Mixed hyperlipidemia: Secondary | ICD-10-CM | POA: Diagnosis not present

## 2019-11-26 DIAGNOSIS — I10 Essential (primary) hypertension: Secondary | ICD-10-CM | POA: Diagnosis not present

## 2019-12-05 ENCOUNTER — Ambulatory Visit
Admission: RE | Admit: 2019-12-05 | Discharge: 2019-12-05 | Disposition: A | Discharge status: Home / Self Care | Payer: PRIVATE HEALTH INSURANCE | Source: Ambulatory Visit | Attending: Obstetrics and Gynecology | Admitting: Obstetrics and Gynecology

## 2019-12-05 ENCOUNTER — Ambulatory Visit: Payer: Self-pay | Admitting: "Endocrinology

## 2019-12-05 ENCOUNTER — Ambulatory Visit: Payer: PRIVATE HEALTH INSURANCE

## 2019-12-05 ENCOUNTER — Other Ambulatory Visit: Payer: Self-pay

## 2019-12-05 DIAGNOSIS — N6489 Other specified disorders of breast: Secondary | ICD-10-CM

## 2019-12-11 DIAGNOSIS — Z794 Long term (current) use of insulin: Secondary | ICD-10-CM | POA: Diagnosis not present

## 2019-12-11 DIAGNOSIS — E119 Type 2 diabetes mellitus without complications: Secondary | ICD-10-CM | POA: Diagnosis not present

## 2019-12-11 DIAGNOSIS — F332 Major depressive disorder, recurrent severe without psychotic features: Secondary | ICD-10-CM | POA: Diagnosis not present

## 2019-12-11 DIAGNOSIS — I10 Essential (primary) hypertension: Secondary | ICD-10-CM | POA: Diagnosis not present

## 2019-12-23 MED FILL — metFORMIN HCL 500 MG TABS: 500 | 30 days supply | Qty: 60 | Fill #0

## 2019-12-23 MED FILL — ATORVASTATIN 20 MG TABLET: 20 | 90 days supply | Qty: 90 | Fill #0

## 2019-12-23 MED FILL — TRULICITY 0.75 MG/0.5 ML PE: 0.75 | 28 days supply | Qty: 2 | Fill #0

## 2020-01-06 ENCOUNTER — Ambulatory Visit (INDEPENDENT_AMBULATORY_CARE_PROVIDER_SITE_OTHER): Payer: 59 | Admitting: "Endocrinology

## 2020-01-06 ENCOUNTER — Other Ambulatory Visit: Payer: Self-pay

## 2020-01-06 ENCOUNTER — Encounter: Payer: Self-pay | Admitting: "Endocrinology

## 2020-01-06 VITALS — BP 117/79 | HR 105 | Ht 62.0 in | Wt 132.6 lb

## 2020-01-06 DIAGNOSIS — E782 Mixed hyperlipidemia: Secondary | ICD-10-CM | POA: Insufficient documentation

## 2020-01-06 DIAGNOSIS — E1165 Type 2 diabetes mellitus with hyperglycemia: Secondary | ICD-10-CM | POA: Diagnosis not present

## 2020-01-06 MED ORDER — ACCU-CHEK GUIDE W/DEVICE KIT: 1.0000 | PACK | 0 refills | Status: DC

## 2020-01-06 MED ORDER — TRULICITY 0.75 MG/0.5ML ~~LOC~~ SOAJ: 0.7500 mg | SUBCUTANEOUS | 2 refills | Status: DC

## 2020-01-06 MED ORDER — ACCU-CHEK GUIDE VI STRP: ORAL_STRIP | 2 refills | Status: DC

## 2020-01-06 NOTE — Patient Instructions (Signed)

## 2020-01-06 NOTE — Progress Notes (Signed)
Endocrinology Consult Note       01/06/2020, 12:34 PM   Subjective:    Patient ID: Anne Wright, female    DOB: 06-Jul-1976.  Anne Wright is being seen in consultation for management of currently uncontrolled symptomatic diabetes requested by  Dianna Rossetti, NP (Inactive).   Past Medical History:  Diagnosis Date  . Diabetes mellitus without complication ([place])     History reviewed. No pertinent surgical history.  Social History   Socioeconomic History  . Marital status: Single    Spouse name: Not on file  . Number of children: Not on file  . Years of education: Not on file  . Highest education level: Not on file  Occupational History  . Not on file  Tobacco Use  . Smoking status: Former Smoker    Quit date: 06/29/2018    Years since quitting: 1.5  . Smokeless tobacco: Never Used  Substance and Sexual Activity  . Alcohol use: Yes    Comment: occasionally  . Drug use: No  . Sexual activity: Not on file  Other Topics Concern  . Not on file  Social History Narrative  . Not on file   Social Determinants of Health   Financial Resource Strain:   . Difficulty of Paying Living Expenses:   Food Insecurity:   . Worried About Charity fundraiser in the Last Year:   . Arboriculturist in the Last Year:   Transportation Needs:   . Film/video editor (Medical):   Marland Kitchen Lack of Transportation (Non-Medical):   Physical Activity:   . Days of Exercise per Week:   . Minutes of Exercise per Session:   Stress:   . Feeling of Stress :   Social Connections:   . Frequency of Communication with Friends and Family:   . Frequency of Social Gatherings with Friends and Family:   . Attends Religious Services:   . Active Member of Clubs or Organizations:   . Attends Archivist Meetings:   Marland Kitchen Marital Status:     Family History  Problem Relation Age of Onset  . Diabetes Mother   .  Hypertension Mother   . Asthma Son   . Breast cancer Maternal Grandmother     Outpatient Encounter Medications as of 01/06/2020  Medication Sig  . aspirin EC 81 MG tablet Take 81 mg by mouth daily.  Marland Kitchen atorvastatin (LIPITOR) 10 MG tablet TK 1 T PO QD  . Blood Glucose Monitoring Suppl (ACCU-CHEK GUIDE) w/Device KIT 1 Piece by Does not apply route as directed.  . Dulaglutide (TRULICITY) 0.14 DC/3.0DT SOPN Inject 0.5 mLs (0.75 mg total) into the skin once a week.  Marland Kitchen glucose blood (ACCU-CHEK GUIDE) test strip Use as instructed  . Insulin Pen Needle (BD PEN NEEDLE NANO U/F) 32G X 4 MM MISC Once daily  . Lancets (ONETOUCH DELICA PLUS [id number]) MISC 2 (two) times daily. for testing  . ONETOUCH VERIO test strip USE TO TEST BLOOD SUGAR LEVELS 2 TIMES DAILY  . [DISCONTINUED] Dulaglutide (TRULICITY) 1.5 VJ/2.8AS SOPN Inject 1.5 mg into the skin once a week.  . [DISCONTINUED] Insulin Glargine (LANTUS  SOLOSTAR) 100 UNIT/ML Solostar Pen Inject 10 Units into the skin daily.  . [DISCONTINUED] metFORMIN (GLUCOPHAGE) 500 MG tablet Take 500 mg by mouth daily with supper.   No facility-administered encounter medications on file as of 01/06/2020.    ALLERGIES: Allergies  Allergen Reactions  . Shellfish Allergy     VACCINATION STATUS: Immunization History  Administered Date(s) Administered  . Tdap 04/10/2015    Diabetes She presents for her initial diabetic visit. She has type 2 diabetes mellitus. Onset time: She was diagnosed at approximate age of 34 years.  She did have a gestational diabetes during her first pregnancy 19 years ago. Her disease Wright has been worsening. There are no hypoglycemic associated symptoms. Pertinent negatives for hypoglycemia include no confusion, headaches, pallor or seizures. Associated symptoms include blurred vision, fatigue, polydipsia and polyuria. Pertinent negatives for diabetes include no chest pain and no polyphagia. There are no hypoglycemic complications. Symptoms  are worsening. There are no diabetic complications. Risk factors for coronary artery disease include diabetes mellitus and dyslipidemia. Current diabetic treatments: She is not tolerating her Metformin.  Currently taking Trulicity 6.16 mg subcutaneously weekly. Her weight is fluctuating minimally. She is following a generally unhealthy diet. When asked about meal planning, she reported none. She has not had a previous visit with a dietitian. She rarely participates in exercise. (She does not have a functioning meter.  Her recent A1c was 11.5% on October 24, 2019.) An ACE inhibitor/angiotensin II receptor blocker is not being taken. Eye exam is not current.  Hyperlipidemia This is a chronic problem. The current episode started more than 1 year ago. The problem is uncontrolled. Exacerbating diseases include diabetes. Pertinent negatives include no chest pain, myalgias or shortness of breath. Current antihyperlipidemic treatment includes statins. Risk factors for coronary artery disease include diabetes mellitus, dyslipidemia and family history.     Review of Systems  Constitutional: Positive for fatigue. Negative for chills, fever and unexpected weight change.  HENT: Negative for trouble swallowing and voice change.   Eyes: Positive for blurred vision. Negative for visual disturbance.  Respiratory: Negative for cough, shortness of breath and wheezing.   Cardiovascular: Negative for chest pain, palpitations and leg swelling.  Gastrointestinal: Negative for diarrhea, nausea and vomiting.  Endocrine: Positive for polydipsia and polyuria. Negative for cold intolerance, heat intolerance and polyphagia.  Musculoskeletal: Negative for arthralgias and myalgias.  Skin: Negative for color change, pallor, rash and wound.  Neurological: Negative for seizures and headaches.  Psychiatric/Behavioral: Negative for confusion and suicidal ideas.    Objective:    Vitals with BMI 01/06/2020 10/08/2018 04/10/2015  Height 5'  2" '5\' 2"'$  '5\' 3"'$   Weight 132 lbs 10 oz 134 lbs 3 oz 141 lbs  BMI 07.37 10.62 25  Systolic 694 854 627  Diastolic 79 84 68  Pulse 035 101 95    BP 117/79   Pulse (!) 105   Ht '5\' 2"'$  (1.575 m)   Wt 132 lb 9.6 oz (60.1 kg)   BMI 24.25 kg/m   Wt Readings from Last 3 Encounters:  01/06/20 132 lb 9.6 oz (60.1 kg)  10/08/18 134 lb 3.2 oz (60.9 kg)  04/10/15 141 lb (64 kg)     Physical Exam Constitutional:      Appearance: She is well-developed.  HENT:     Head: Normocephalic and atraumatic.  Neck:     Thyroid: No thyromegaly.     Trachea: No tracheal deviation.  Cardiovascular:     Rate and Rhythm: Normal rate and  regular rhythm.  Pulmonary:     Effort: Pulmonary effort is normal.  Abdominal:     Tenderness: There is no abdominal tenderness. There is no guarding.  Musculoskeletal:        General: Normal range of motion.     Cervical back: Normal range of motion and neck supple.     Comments: Healing pressure ulcer on lateral aspect of R Foot  Skin:    General: Skin is warm and dry.     Coloration: Skin is not pale.     Findings: No erythema or rash.  Neurological:     Mental Status: She is alert and oriented to person, place, and time.     Cranial Nerves: No cranial nerve deficit.     Coordination: Coordination normal.     Deep Tendon Reflexes: Reflexes are normal and symmetric.  Psychiatric:        Judgment: Judgment normal.       CMP ( most recent) CMP     Component Value Date/Time   NA 133 (L) 05/20/2011 1613   K 4.0 05/20/2011 1613   CL 97 05/20/2011 1613   CO2 27 05/20/2011 1613   GLUCOSE 348 (H) 05/20/2011 1613   BUN 18 10/24/2019 0000   CREATININE 0.6 10/24/2019 0000   CREATININE 0.54 05/20/2011 1613   CALCIUM 10.2 05/20/2011 1613   GFRNONAA >90 05/20/2011 1613   GFRAA >90 05/20/2011 1613     Diabetic Labs (most recent): Lab Results  Component Value Date   HGBA1C 11.4 10/24/2019   Recent Results (from the past 2160 hour(s))  Hemoglobin A1c      Status: None   Collection Time: 10/24/19 12:00 AM  Result Value Ref Range   Hemoglobin A1C 50.3   Basic metabolic panel     Status: None   Collection Time: 10/24/19 12:00 AM  Result Value Ref Range   BUN 18 4 - 21   Creatinine 0.6 0.5 - 1.1  Lipid panel     Status: Abnormal   Collection Time: 10/24/19 12:00 AM  Result Value Ref Range   Triglycerides 191 (A) 40 - 160   Cholesterol 243 (A) 0 - 200   HDL 58 35 - 70   LDL Cholesterol 151       Assessment & Plan:   1. Uncontrolled type 2 diabetes mellitus with hyperglycemia ([place])  - Anne Wright has currently uncontrolled symptomatic type 2 DM since 44 years of age,  with most recent A1c of 11.4 %. Recent labs reviewed. - I had a long discussion with her about the progressive nature of diabetes and the pathology behind its complications. -She did not report gross complications from her diabetes, however, she remains at a high risk for more acute and chronic complications which include CAD, CVA, CKD, retinopathy, and neuropathy. These are all discussed in detail with her.  - I have counseled her on diet  and weight management  by adopting a carbohydrate restricted/protein rich diet. Patient is encouraged to switch to  unprocessed or minimally processed     complex starch and increased protein intake (animal or plant source), fruits, and vegetables. -  she is advised to stick to a routine mealtimes to eat 3 meals  a day and avoid unnecessary snacks ( to snack only to correct hypoglycemia).   - she admits that there is a room for improvement in her food and drink choices. - Suggestion is made for her to avoid simple carbohydrates  from her diet including  Cakes, Sweet Desserts, Ice Cream, Soda (diet and regular), Sweet Tea, Candies, Chips, Cookies, Store Bought Juices, Alcohol in Excess of  1-2 drinks a day, Artificial Sweeteners,  Coffee Creamer, and "Sugar-free" Products. This will help patient to have more stable blood glucose profile  and potentially avoid unintended weight gain.  - she will be scheduled with Jearld Fenton, RDN, CDE for diabetes education.  - I have approached her with the following individualized plan to manage  her diabetes and patient agrees:   - she will likely need insulin treatment in order for her to achieve control of diabetes to target. -In preparation, she is approached to start strict monitoring of blood glucose 4 times a day-before meals and at bedtime and return in 10 days for reevaluation. - a new Meter and testing supplies were prescribed for her. - she is encouraged to call clinic for blood glucose levels less than 70 or above 300 mg /dl. - she is advised to continue Trulicity 8.33 mg subcutaneously weekly, therapeutically suitable for patient . - her Metformin will be discontinued due to intolerance.  - she will be considered for incretin therapy as appropriate next visit.  - Specific targets for  A1c;  LDL, HDL,  and Triglycerides were discussed with the patient.  2) Blood Pressure /Hypertension:  her blood pressure is marginally controlled to target.   she is not on antihypertensive medications for now.   3) Lipids/Hyperlipidemia:   Review of her recent lipid panel showed not controlled  LDL at 151 .  she  is advised to continue    atorvastatin 10 mg daily at bedtime.  Side effects and precautions discussed with her.  This medication will be increased during her subsequent visit.  4)  Weight/Diet:  Body mass index is 24.25 kg/m.   she is not a candidate for weight loss. Exercise, and detailed carbohydrates information provided  -  detailed on discharge instructions.  5) Chronic Care/Health Maintenance:  -she  is on Statin medications and  is encouraged to initiate and continue to follow up with Ophthalmology, Dentist,  Podiatrist at least yearly or according to recommendations, and advised to   stay away from smoking. I have recommended yearly flu vaccine and pneumonia vaccine at least  every 5 years; moderate intensity exercise for up to 150 minutes weekly; and  sleep for at least 7 hours a day.  - she is  advised to maintain close follow up with Dianna Rossetti, NP (Inactive) for primary care needs, as well as her other providers for optimal and coordinated care.   - Time spent in this patient care: 60 min, of which > 50% was spent in  counseling  her about her currently uncontrolled diabetes, hyperlipidemia and the rest reviewing her blood glucose logs , discussing her hypoglycemia and hyperglycemia episodes, reviewing her current and  previous labs / studies  ( including abstraction from other facilities) and medications  doses and developing a  long term treatment plan based on the latest standards of care/ guidelines; and documenting her care.    Please refer to Patient Instructions for Blood Glucose Monitoring and Insulin/Medications Dosing Guide"  in media tab for additional information. Please  also refer to " Patient Self Inventory" in the Media  tab for reviewed elements of pertinent patient history.  Anne Wright participated in the discussions, expressed understanding, and voiced agreement with the above plans.  All questions were answered to her satisfaction. she is encouraged to contact clinic should she have any  questions or concerns prior to her return visit.   Follow up plan: - Return in about 10 days (around 01/16/2020) for Bring Meter and Logs- A1c in Office.  Glade Lloyd, MD [facility] [address] [place], [place] [address] Phone: [phone]  Fax: [phone]    01/06/2020, 12:34 PM  This note was partially dictated with voice recognition software. Similar sounding words can be transcribed inadequately or may not  be corrected upon review.

## 2020-01-08 ENCOUNTER — Telehealth: Payer: Self-pay | Admitting: "Endocrinology

## 2020-01-08 ENCOUNTER — Other Ambulatory Visit: Payer: Self-pay

## 2020-01-08 DIAGNOSIS — E1165 Type 2 diabetes mellitus with hyperglycemia: Secondary | ICD-10-CM

## 2020-01-08 MED ORDER — TRULICITY 0.75 MG/0.5ML ~~LOC~~ SOAJ: 0.7500 mg | SUBCUTANEOUS | 2 refills | Status: DC

## 2020-01-08 MED ORDER — ACCU-CHEK GUIDE W/DEVICE KIT: 1.0000 | PACK | 0 refills | Status: DC

## 2020-01-08 MED ORDER — ACCU-CHEK GUIDE VI STRP: ORAL_STRIP | 2 refills | Status: DC

## 2020-01-08 NOTE — Telephone Encounter (Signed)
Sent in

## 2020-01-08 NOTE — Telephone Encounter (Signed)
Patient is calling and states her 3 prescriptions that were called in by Dr. Fransico Him was sent to the wrong pharmacy. Please send them below.   [facility] - [place], [place] - [address] [phone] (Phone) [phone] (Fax)

## 2020-01-13 ENCOUNTER — Other Ambulatory Visit: Payer: Self-pay

## 2020-01-13 MED ORDER — FREESTYLE LANCETS MISC
12 refills | Status: DC
Start: 2020-01-13 — End: 2022-05-10

## 2020-01-13 MED ORDER — FREESTYLE LITE DEVI: 0 refills | Status: DC

## 2020-01-13 MED ORDER — FREESTYLE LITE TEST VI STRP
ORAL_STRIP | 12 refills | Status: DC
Start: 2020-01-13 — End: 2020-06-05

## 2020-01-13 MED FILL — FREESTYLE LITE TEST STRIP: 25 days supply | Qty: 100 | Fill #0

## 2020-01-13 MED FILL — FREESTYLE LITE METER: 25 days supply | Qty: 1 | Fill #0

## 2020-01-13 MED FILL — FREESTYLE LANCETS: 25 days supply | Qty: 100 | Fill #0

## 2020-01-20 ENCOUNTER — Encounter: Payer: 59 | Attending: Plastic Surgery | Admitting: Nutrition

## 2020-01-20 ENCOUNTER — Encounter: Payer: Self-pay | Admitting: Nutrition

## 2020-01-20 ENCOUNTER — Encounter: Payer: Self-pay | Admitting: "Endocrinology

## 2020-01-20 ENCOUNTER — Ambulatory Visit (INDEPENDENT_AMBULATORY_CARE_PROVIDER_SITE_OTHER): Payer: 59 | Admitting: "Endocrinology

## 2020-01-20 ENCOUNTER — Other Ambulatory Visit: Payer: Self-pay

## 2020-01-20 VITALS — Ht 62.0 in | Wt 137.6 lb

## 2020-01-20 VITALS — BP 166/106 | HR 92 | Ht 62.0 in | Wt 137.8 lb

## 2020-01-20 DIAGNOSIS — I10 Essential (primary) hypertension: Secondary | ICD-10-CM | POA: Insufficient documentation

## 2020-01-20 DIAGNOSIS — E1165 Type 2 diabetes mellitus with hyperglycemia: Secondary | ICD-10-CM

## 2020-01-20 DIAGNOSIS — E782 Mixed hyperlipidemia: Secondary | ICD-10-CM

## 2020-01-20 LAB — POCT GLYCOSYLATED HEMOGLOBIN (HGB A1C): Hemoglobin A1C: 9.7 % — AB (ref 4.0–5.6)

## 2020-01-20 MED ORDER — LISINOPRIL 10 MG PO TABS: 10.0000 mg | ORAL_TABLET | Freq: Every day | ORAL | 1 refills | Status: DC

## 2020-01-20 MED ORDER — BD PEN NEEDLE NANO U/F 32G X 4 MM MISC
1.0000 | Freq: Four times a day (QID) | 2 refills | Status: DC
Start: 2020-01-20 — End: 2021-04-27

## 2020-01-20 MED ORDER — TRESIBA FLEXTOUCH 100 UNIT/ML ~~LOC~~ SOPN: 20.0000 [IU] | PEN_INJECTOR | Freq: Every day | SUBCUTANEOUS | 2 refills | Status: DC

## 2020-01-20 MED FILL — BASAGLAR 100 UNIT/ML KWIKPE: 100 | 75 days supply | Qty: 15 | Fill #0

## 2020-01-20 MED FILL — UNIFINE PENTIPS 32GX5/32: 32G X 4 MM | 25 days supply | Qty: 100 | Fill #0

## 2020-01-20 MED FILL — LISINOPRIL 10 MG TABS: 10 | 90 days supply | Qty: 90 | Fill #0

## 2020-01-20 NOTE — Progress Notes (Signed)
Endocrinology Consult Note       01/20/2020, 10:51 AM   Subjective:    Patient ID: Anne Wright, female    DOB: 01-14-1976.  Anne Gikas is being seen in consultation for management of currently uncontrolled symptomatic diabetes requested by  Levonne Lapping, NP (Inactive).   Past Medical History:  Diagnosis Date  . Diabetes mellitus without complication (HCC)     History reviewed. No pertinent surgical history.  Social History   Socioeconomic History  . Marital status: Single    Spouse name: Not on file  . Number of children: Not on file  . Years of education: Not on file  . Highest education level: Not on file  Occupational History  . Not on file  Tobacco Use  . Smoking status: Former Smoker    Quit date: 06/29/2018    Years since quitting: 1.5  . Smokeless tobacco: Never Used  Substance and Sexual Activity  . Alcohol use: Yes    Comment: occasionally  . Drug use: No  . Sexual activity: Not on file  Other Topics Concern  . Not on file  Social History Narrative  . Not on file   Social Determinants of Health   Financial Resource Strain:   . Difficulty of Paying Living Expenses:   Food Insecurity:   . Worried About Programme researcher, broadcasting/film/video in the Last Year:   . Barista in the Last Year:   Transportation Needs:   . Freight forwarder (Medical):   Marland Kitchen Lack of Transportation (Non-Medical):   Physical Activity:   . Days of Exercise per Week:   . Minutes of Exercise per Session:   Stress:   . Feeling of Stress :   Social Connections:   . Frequency of Communication with Friends and Family:   . Frequency of Social Gatherings with Friends and Family:   . Attends Religious Services:   . Active Member of Clubs or Organizations:   . Attends Banker Meetings:   Marland Kitchen Marital Status:     Family History  Problem Relation Age of Onset  . Diabetes Mother   .  Hypertension Mother   . Asthma Son   . Breast cancer Maternal Grandmother     Outpatient Encounter Medications as of 01/20/2020  Medication Sig  . aspirin EC 81 MG tablet Take 81 mg by mouth daily.  Marland Kitchen atorvastatin (LIPITOR) 10 MG tablet TK 1 T PO QD  . Blood Glucose Monitoring Suppl (FREESTYLE LITE) DEVI Freestyle lite meter to test blood glucose four times daily  . busPIRone (BUSPAR) 5 MG tablet buspirone 5 mg tablet  . Dulaglutide (TRULICITY) 0.75 MG/0.5ML SOPN Inject 0.5 mLs (0.75 mg total) into the skin once a week.  Marland Kitchen glucose blood (FREESTYLE LITE) test strip Test blood glucose four times daily  . Insulin Pen Needle (BD PEN NEEDLE NANO U/F) 32G X 4 MM MISC Once daily  . Lancets (FREESTYLE) lancets Test blod glucose four times daily. Use as instructed  . insulin degludec (TRESIBA FLEXTOUCH) 100 UNIT/ML FlexTouch Pen Inject 0.2 mLs (20 Units total) into the skin daily.  . Insulin Pen  Needle (BD PEN NEEDLE NANO U/F) 32G X 4 MM MISC 1 each by Does not apply route 4 (four) times daily.  Marland Kitchen lisinopril (ZESTRIL) 10 MG tablet Take 1 tablet (10 mg total) by mouth daily.   No facility-administered encounter medications on file as of 01/20/2020.    ALLERGIES: Allergies  Allergen Reactions  . Shellfish Allergy     VACCINATION STATUS: Immunization History  Administered Date(s) Administered  . Tdap 04/10/2015    Diabetes She presents for her follow-up diabetic visit. She has type 2 diabetes mellitus. Onset time: She was diagnosed at approximate age of 32 years.  She did have a gestational diabetes during her first pregnancy 19 years ago. Her disease course has been worsening. There are no hypoglycemic associated symptoms. Pertinent negatives for hypoglycemia include no confusion, headaches, pallor or seizures. Associated symptoms include blurred vision, fatigue, polydipsia and polyuria. Pertinent negatives for diabetes include no chest pain and no polyphagia. There are no hypoglycemic  complications. There are no diabetic complications. Risk factors for coronary artery disease include diabetes mellitus and dyslipidemia. Current diabetic treatments: She is not tolerating her Metformin.  Currently taking Trulicity 0.75 mg subcutaneously weekly. Her weight is stable. She is following a generally unhealthy diet. When asked about meal planning, she reported none. She has not had a previous visit with a dietitian. She rarely participates in exercise. Her home blood glucose trend is increasing steadily. Her breakfast blood glucose range is generally >200 mg/dl. Her lunch blood glucose range is generally >200 mg/dl. Her dinner blood glucose range is generally >200 mg/dl. Her bedtime blood glucose range is generally >200 mg/dl. Her overall blood glucose range is >200 mg/dl. (She presents with significantly above target glycemic profile, A1c point-of-care A1c 9.7% slightly better than the  last A1c of 11.5%.  She did not document or report hypoglycemia.) An ACE inhibitor/angiotensin II receptor blocker is not being taken. Eye exam is not current.  Hyperlipidemia This is a chronic problem. The current episode started more than 1 year ago. The problem is uncontrolled. Exacerbating diseases include diabetes. Pertinent negatives include no chest pain, myalgias or shortness of breath. Current antihyperlipidemic treatment includes statins. Risk factors for coronary artery disease include diabetes mellitus, dyslipidemia and family history.    Review of Systems  Constitutional: Positive for fatigue. Negative for chills, fever and unexpected weight change.  HENT: Negative for trouble swallowing and voice change.   Eyes: Positive for blurred vision. Negative for visual disturbance.  Respiratory: Negative for cough, shortness of breath and wheezing.   Cardiovascular: Negative for chest pain, palpitations and leg swelling.  Gastrointestinal: Negative for diarrhea, nausea and vomiting.  Endocrine: Positive  for polydipsia and polyuria. Negative for cold intolerance, heat intolerance and polyphagia.  Musculoskeletal: Negative for arthralgias and myalgias.  Skin: Negative for color change, pallor, rash and wound.  Neurological: Negative for seizures and headaches.  Psychiatric/Behavioral: Negative for confusion and suicidal ideas.    Objective:    Vitals with BMI 01/20/2020 01/20/2020 01/06/2020  Height 5\' 2"  5\' 2"  5\' 2"   Weight 137 lbs 10 oz 137 lbs 13 oz 132 lbs 10 oz  BMI 25.16 25.2 24.25  Systolic - 166 117  Diastolic - 106 79  Pulse - 92    BP (!) 166/106 (BP Location: Right Arm, Patient Position: Sitting)   Pulse 92   Ht 5\' 2"  (1.575 m)   Wt 137 lb 12.8 oz (62.5 kg)   BMI 25.20 kg/m   Wt Readings from Last 3  Encounters:  01/20/20 137 lb 9.6 oz (62.4 kg)  01/20/20 137 lb 12.8 oz (62.5 kg)  01/06/20 132 lb 9.6 oz (60.1 kg)     Physical Exam Constitutional:      Appearance: She is well-developed.  HENT:     Head: Normocephalic and atraumatic.  Neck:     Thyroid: No thyromegaly.     Trachea: No tracheal deviation.  Cardiovascular:     Rate and Rhythm: Normal rate and regular rhythm.  Pulmonary:     Effort: Pulmonary effort is normal.  Abdominal:     Tenderness: There is no abdominal tenderness. There is no guarding.  Musculoskeletal:        General: Normal range of motion.     Cervical back: Normal range of motion and neck supple.     Comments: Healing pressure ulcer on lateral aspect of R Foot  Skin:    General: Skin is warm and dry.     Coloration: Skin is not pale.     Findings: No erythema or rash.  Neurological:     Mental Status: She is alert and oriented to person, place, and time.     Cranial Nerves: No cranial nerve deficit.     Coordination: Coordination normal.     Deep Tendon Reflexes: Reflexes are normal and symmetric.  Psychiatric:        Judgment: Judgment normal.     CMP ( most recent) CMP     Component Value Date/Time   NA 133 (L)  05/20/2011 1613   K 4.0 05/20/2011 1613   CL 97 05/20/2011 1613   CO2 27 05/20/2011 1613   GLUCOSE 348 (H) 05/20/2011 1613   BUN 18 10/24/2019 0000   CREATININE 0.6 10/24/2019 0000   CREATININE 0.54 05/20/2011 1613   CALCIUM 10.2 05/20/2011 1613   GFRNONAA >90 05/20/2011 1613   GFRAA >90 05/20/2011 1613     Diabetic Labs (most recent): Lab Results  Component Value Date   HGBA1C 9.7 (A) 01/20/2020   HGBA1C 11.4 10/24/2019   Recent Results (from the past 2160 hour(s))  Hemoglobin A1c     Status: None   Collection Time: 10/24/19 12:00 AM  Result Value Ref Range   Hemoglobin A1C 11.4   Basic metabolic panel     Status: None   Collection Time: 10/24/19 12:00 AM  Result Value Ref Range   BUN 18 4 - 21   Creatinine 0.6 0.5 - 1.1  Lipid panel     Status: Abnormal   Collection Time: 10/24/19 12:00 AM  Result Value Ref Range   Triglycerides 191 (A) 40 - 160   Cholesterol 243 (A) 0 - 200   HDL 58 35 - 70   LDL Cholesterol 151   HgB T6A     Status: Abnormal   Collection Time: 01/20/20  8:47 AM  Result Value Ref Range   Hemoglobin A1C 9.7 (A) 4.0 - 5.6 %   HbA1c POC (<> result, manual entry)     HbA1c, POC (prediabetic range)     HbA1c, POC (controlled diabetic range)        Assessment & Plan:   1. Uncontrolled type 2 diabetes mellitus with hyperglycemia (HCC)  - Anne Wright has currently uncontrolled symptomatic type 2 DM since 44 years of age.  She presents with significantly above target glycemic profile, A1c point-of-care A1c 9.7% slightly better than the  last A1c of 11.5%.  She did not document or report hypoglycemia. - Recent labs reviewed. - I  had a long discussion with her about the progressive nature of diabetes and the pathology behind its complications. -She did not report gross complications from her diabetes, however, she remains at a high risk for more acute and chronic complications which include CAD, CVA, CKD, retinopathy, and neuropathy. These are all  discussed in detail with her.  - I have counseled her on diet  and weight management  by adopting a carbohydrate restricted/protein rich diet. Patient is encouraged to switch to  unprocessed or minimally processed     complex starch and increased protein intake (animal or plant source), fruits, and vegetables. -  she is advised to stick to a routine mealtimes to eat 3 meals  a day and avoid unnecessary snacks ( to snack only to correct hypoglycemia).   - she  admits there is a room for improvement in her diet and drink choices. -  Suggestion is made for her to avoid simple carbohydrates  from her diet including Cakes, Sweet Desserts / Pastries, Ice Cream, Soda (diet and regular), Sweet Tea, Candies, Chips, Cookies, Sweet Pastries,  Store Bought Juices, Alcohol in Excess of  1-2 drinks a day, Artificial Sweeteners, Coffee Creamer, and "Sugar-free" Products. This will help patient to have stable blood glucose profile and potentially avoid unintended weight gain.   - she will be scheduled with Norm SaltPenny Crumpton, RDN, CDE for diabetes education.  - I have approached her with the following individualized plan to manage  her diabetes and patient agrees:   -Based on her presentation with significantly above target glycemic profile both fasting and postprandial, she will need insulin treatment in order for her to achieve control of diabetes to target.    -I discussed and initiated Tresiba 20 units nightly, associated with monitoring of blood glucose at least twice a day-daily before breakfast and at bedtime. -Insulin use technique was demonstrated for her and a sample pen was given to her from clinic.  - she is encouraged to call clinic for blood glucose levels less than 70 or above 300 mg /dl. - she is advised to continue Trulicity 0.75 mg subcutaneously weekly, therapeutically suitable for patient . - her Metformin will be discontinued due to intolerance.   - Specific targets for  A1c;  LDL, HDL,  and  Triglycerides were discussed with the patient.  2) Blood Pressure /Hypertension: Her blood pressure is significantly above target this morning on 2 separate measurements.  She states that she was told she has high blood pressure in the past, currently not on treatment.  I discussed and added lisinopril 10 mg p.o. daily at breakfast.  3) Lipids/Hyperlipidemia:   Review of her recent lipid panel showed not controlled  LDL at 151 .  she  is advised to continue atorvastatin 10 mg p.o. daily at bedtime.  Side effects and precautions discussed with her.  This medication will be increased during her subsequent visit.  4)  Weight/Diet:  Body mass index is 25.2 kg/m.   she is not a candidate for weight loss. Exercise, and detailed carbohydrates information provided  -  detailed on discharge instructions.  5) Chronic Care/Health Maintenance:  -she  is on Statin medications and  is encouraged to initiate and continue to follow up with Ophthalmology, Dentist,  Podiatrist at least yearly or according to recommendations, and advised to   stay away from smoking. I have recommended yearly flu vaccine and pneumonia vaccine at least every 5 years; moderate intensity exercise for up to 150 minutes weekly; and  sleep for at least 7 hours a day.  - she is  advised to maintain close follow up with Dianna Rossetti, NP (Inactive) for primary care needs, as well as her other providers for optimal and coordinated care.  - Time spent on this patient care encounter:  35 min, of which > 50% was spent in  counseling and the rest reviewing her blood glucose logs , discussing her hypoglycemia and hyperglycemia episodes, reviewing her current and  previous labs / studies  ( including abstraction from other facilities) and medications  doses and developing a  long term treatment plan and documenting her care.   Please refer to Patient Instructions for Blood Glucose Monitoring and Insulin/Medications Dosing Guide"  in media tab for  additional information. Please  also refer to " Patient Self Inventory" in the Media  tab for reviewed elements of pertinent patient history.  Anne Estabrook participated in the discussions, expressed understanding, and voiced agreement with the above plans.  All questions were answered to her satisfaction. she is encouraged to contact clinic should she have any questions or concerns prior to her return visit.   Follow up plan: - Return in about 3 months (around 04/21/2020) for F/U with Pre-visit Labs, Meter, Logs, A1c here.Glade Lloyd, MD [facility] [address] [place],  [address] Phone: [phone]  Fax: [phone]    01/20/2020, 10:51 AM  This note was partially dictated with voice recognition software. Similar sounding words can be transcribed inadequately or may not  be corrected upon review.

## 2020-01-20 NOTE — Progress Notes (Signed)
  Medical Nutrition Therapy:  Appt start time: 0900 end time:  0930. First Visit [employer] Employee [phone]  Assessment:  Primary concerns today: Dm 2 x 12 yrs.  LIves with her 2 kids  [employer] employee [phone]. Walk in From Dr. Fransico Him, Endocrinology. Starting Tresibas 20 units today per Dr. Fransico Him. Taking  Trulicity 1 week. BP was elevated today. To start Lisinopril today. Appetite better.  DIdn't tolerate metformin due to poor appetite in the past. Works 3 shift 7 p to 7 a, at [employer].. Has had symptoms of hyper/hypoglycemia on and off. Has been trying to work 3rd shift but eat on fist shift and that has her BS elevated and dropping at times. Drinks water. Testing blood sugars. Hasn't seen a CDE before but saw an RD at [facility] 12 yrs ago.. Motivated to make changes to improve her DM. Will address safety questions at next visit. Current diet is inconsistent to meet her needs.   Lab Results  Component Value Date   HGBA1C 9.7 (A) 01/20/2020   CMP Latest Ref Rng & Units 10/24/2019 05/20/2011  Glucose 70 - 99 mg/dL - 505(L)  BUN 4 - 21 18 8   Creatinine 0.5 - 1.1 0.6 0.54  Sodium 135 - 145 mEq/L - 133(L)  Potassium 3.5 - 5.1 mEq/L - 4.0  Chloride 96 - 112 mEq/L - 97  CO2 19 - 32 mEq/L - 27  Calcium 8.4 - 10.5 mg/dL -     Preferred Learning Style:  Auditory  Visual  Hands on  No preference indicated   Learning Readiness:   Ready  Change in progress   MEDICATIONS: See list.   DIETARY INTAKE:  24-hr recall:  B) bacon, eggs water Lunch) 2 tacos forn tortilla,water Dinner: Beef, hot/sour soup, noodles, vegetables, water Snack 2 small bags of potato chips,  MISC: chips or pb crackers.  Usual physical activity: Walks a lot on her job.  Estimated energy needs: 1500-1600  calories 170 g carbohydrates 112 g protein 42 g fat  Progress Towards Goal(s):  In progress.   Nutritional Diagnosis:  NB-1.1 Food and nutrition-related knowledge deficit As related to Diabetes  Type 2.  As evidenced by A1C .    Intervention:  Nutrition and Diabetes education provided on My Plate, CHO counting, meal planning, portion sizes, timing of meals, avoiding snacks between meals unless having a low blood sugar, target ranges for A1C and blood sugars, signs/symptoms and treatment of hyper/hypoglycemia, monitoring blood sugars, taking medications as prescribed, benefits of exercising 30 minutes per day and prevention of complications of DM. 97.6  Goals Follow My Plate Eat meals on time as discussed. Eat 2-3 carb choices per meal Increase fresh fruits and water Cut out snacks Get A1C down to 7%  Teaching Method Utilized:  Visual Auditory Hands on  Handouts given during visit include:  The Plate Method   Meal Plan Card  Diabetes Instructions.    Barriers to learning/adherence to lifestyle change: none  Demonstrated degree of understanding via:  Teach Back   Monitoring/Evaluation:  Dietary intake, exercise, , and body weight in 1 month(s).

## 2020-01-20 NOTE — Patient Instructions (Signed)
Goals Follow My Plate Eat meals on time as discussed. Eat 2-3 carb choices per meal Increase fresh fruits and water Cut out snacks Get A1C down to 7%

## 2020-01-20 NOTE — Patient Instructions (Signed)

## 2020-01-24 MED FILL — TRULICITY 0.75 MG/0.5 ML PE: 0.75 | 28 days supply | Qty: 2 | Fill #1

## 2020-01-27 ENCOUNTER — Other Ambulatory Visit: Payer: Self-pay

## 2020-02-10 MED FILL — FREESTYLE LITE TEST STRIP: 25 days supply | Qty: 100 | Fill #1

## 2020-02-17 MED FILL — TRULICITY 0.75 MG/0.5 ML PE: 0.75 | 28 days supply | Qty: 2 | Fill #0

## 2020-04-22 ENCOUNTER — Ambulatory Visit: Payer: PRIVATE HEALTH INSURANCE | Admitting: Nutrition

## 2020-04-22 ENCOUNTER — Ambulatory Visit: Payer: 59 | Admitting: "Endocrinology

## 2020-05-20 ENCOUNTER — Other Ambulatory Visit: Payer: Self-pay | Admitting: "Endocrinology

## 2020-05-20 LAB — COMPLETE METABOLIC PANEL WITH GFR
AG Ratio: 1.7 (calc) (ref 1.0–2.5)
ALT: 40 U/L — ABNORMAL HIGH (ref 6–29)
AST: 29 U/L (ref 10–30)
Albumin: 3.8 g/dL (ref 3.6–5.1)
Alkaline phosphatase (APISO): 65 U/L (ref 31–125)
BUN: 13 mg/dL (ref 7–25)
CO2: 28 mmol/L (ref 20–32)
Calcium: 9.1 mg/dL (ref 8.6–10.2)
Chloride: 107 mmol/L (ref 98–110)
Creat: 0.66 mg/dL (ref 0.50–1.10)
GFR, Est African American: 126 mL/min/{1.73_m2} (ref >=60)
GFR, Est Non African American: 109 mL/min/{1.73_m2} (ref >=60)
Globulin: 2.3 g/dL (calc) (ref 1.9–3.7)
Glucose, Bld: 126 mg/dL — ABNORMAL HIGH (ref 65–99)
Potassium: 4.1 mmol/L (ref 3.5–5.3)
Sodium: 140 mmol/L (ref 135–146)
Total Bilirubin: 0.3 mg/dL (ref 0.2–1.2)
Total Protein: 6.1 g/dL (ref 6.1–8.1)

## 2020-05-20 LAB — LIPID PANEL
Cholesterol: 154 mg/dL (ref <200)
HDL: 73 mg/dL (ref >=50)
LDL Cholesterol (Calc): 60 mg/dL (calc)
Non-HDL Cholesterol (Calc): 81 mg/dL (calc) (ref <130)
Total CHOL/HDL Ratio: 2.1 (calc) (ref <5.0)
Triglycerides: 128 mg/dL (ref <150)

## 2020-05-20 LAB — TSH: TSH: 3.08 mIU/L

## 2020-05-20 LAB — VITAMIN D 25 HYDROXY (VIT D DEFICIENCY, FRACTURES): Vit D, 25-Hydroxy: 9 ng/mL — ABNORMAL LOW (ref 30–100)

## 2020-05-20 LAB — T4, FREE: Free T4: 1.1 ng/dL (ref 0.8–1.8)

## 2020-05-20 MED ORDER — VITAMIN D (ERGOCALCIFEROL) 1.25 MG (50000 UNIT) PO CAPS: 50000.0000 [IU] | ORAL_CAPSULE | ORAL | 0 refills | Status: DC

## 2020-05-20 MED FILL — VIT D2 1.25 MG (50,000 UNIT: 1.25 MG | 84 days supply | Qty: 12 | Fill #0

## 2020-06-03 ENCOUNTER — Telehealth: Payer: Self-pay | Admitting: "Endocrinology

## 2020-06-03 ENCOUNTER — Other Ambulatory Visit: Payer: Self-pay | Admitting: "Endocrinology

## 2020-06-03 NOTE — Telephone Encounter (Signed)
PT IS [facility] FOR 11/5 BUT CALLED IN TODAY REQUESTING LAB RESULTS

## 2020-06-03 NOTE — Telephone Encounter (Signed)
Please advise 

## 2020-06-03 NOTE — Telephone Encounter (Signed)
Very low vit D, will do a1c here. Continue on same plan for diabetes, will discuss details at visit.

## 2020-06-04 NOTE — Telephone Encounter (Signed)
Patient returned call and advised, will come in for appt as scheduled to discuss.

## 2020-06-04 NOTE — Telephone Encounter (Signed)
Returned call to patient, no answer, left message to return call to advise.

## 2020-06-05 ENCOUNTER — Encounter: Payer: Self-pay | Admitting: "Endocrinology

## 2020-06-05 ENCOUNTER — Other Ambulatory Visit: Payer: Self-pay

## 2020-06-05 ENCOUNTER — Ambulatory Visit (INDEPENDENT_AMBULATORY_CARE_PROVIDER_SITE_OTHER): Payer: Commercial Managed Care - PPO | Admitting: "Endocrinology

## 2020-06-05 VITALS — BP 144/88 | HR 100 | Ht 62.0 in | Wt 147.0 lb

## 2020-06-05 DIAGNOSIS — I10 Essential (primary) hypertension: Secondary | ICD-10-CM | POA: Diagnosis not present

## 2020-06-05 DIAGNOSIS — E559 Vitamin D deficiency, unspecified: Secondary | ICD-10-CM | POA: Diagnosis not present

## 2020-06-05 DIAGNOSIS — E782 Mixed hyperlipidemia: Secondary | ICD-10-CM

## 2020-06-05 DIAGNOSIS — E1165 Type 2 diabetes mellitus with hyperglycemia: Secondary | ICD-10-CM | POA: Diagnosis not present

## 2020-06-05 LAB — POCT GLYCOSYLATED HEMOGLOBIN (HGB A1C): Hemoglobin A1C: 6.5 % — AB (ref 4.0–5.6)

## 2020-06-05 MED ORDER — LISINOPRIL 10 MG PO TABS
10.0000 mg | ORAL_TABLET | Freq: Every day | ORAL | 1 refills | Status: DC
Start: 2020-06-05 — End: 2020-10-20

## 2020-06-05 MED ORDER — TRULICITY 0.75 MG/0.5ML ~~LOC~~ SOAJ: 0.7500 mg | SUBCUTANEOUS | 1 refills | Status: DC

## 2020-06-05 MED ORDER — FREESTYLE LITE TEST VI STRP: ORAL_STRIP | 2 refills | Status: DC

## 2020-06-05 MED ORDER — TRESIBA FLEXTOUCH 100 UNIT/ML ~~LOC~~ SOPN: 20.0000 [IU] | PEN_INJECTOR | Freq: Every day | SUBCUTANEOUS | 1 refills | Status: DC

## 2020-06-05 MED ORDER — VITAMIN D (ERGOCALCIFEROL) 1.25 MG (50000 UNIT) PO CAPS: 50000.0000 [IU] | ORAL_CAPSULE | ORAL | 0 refills | Status: DC

## 2020-06-05 NOTE — Progress Notes (Signed)
06/05/2020, 9:28 AM   Endocrinology follow-up note  Subjective:    Patient ID: Anne Wright, female    DOB: [birth year].  Anne Wright is being seen in follow-up after she was seen in consultation for management of currently uncontrolled symptomatic diabetes requested by  Levonne Lapping, NP (Inactive).   Past Medical History:  Diagnosis Date  . Diabetes mellitus without complication (HCC)     History reviewed. No pertinent surgical history.  Social History   Socioeconomic History  . Marital status: Single    Spouse name: Not on file  . Number of children: Not on file  . Years of education: Not on file  . Highest education level: Not on file  Occupational History  . Not on file  Tobacco Use  . Smoking status: Former Smoker    Quit date: 06/29/2018    Years since quitting: 1.9  . Smokeless tobacco: Never Used  Substance and Sexual Activity  . Alcohol use: Yes    Comment: occasionally  . Drug use: No  . Sexual activity: Not on file  Other Topics Concern  . Not on file  Social History Narrative  . Not on file   Social Determinants of Health   Financial Resource Strain:   . Difficulty of Paying Living Expenses: Not on file  Food Insecurity:   . Worried About Programme researcher, broadcasting/film/video in the Last Year: Not on file  . Ran Out of Food in the Last Year: Not on file  Transportation Needs:   . Lack of Transportation (Medical): Not on file  . Lack of Transportation (Non-Medical): Not on file  Physical Activity:   . Days of Exercise per Week: Not on file  . Minutes of Exercise per Session: Not on file  Stress:   . Feeling of Stress : Not on file  Social Connections:   . Frequency of Communication with Friends and Family: Not on file  . Frequency of Social Gatherings with Friends and Family: Not on file  . Attends Religious Services: Not on file  . Active Member of Clubs or  Organizations: Not on file  . Attends Banker Meetings: Not on file  . Marital Status: Not on file    Family History  Problem Relation Age of Onset  . Diabetes Mother   . Hypertension Mother   . Asthma Son   . Breast cancer Maternal Grandmother     Outpatient Encounter Medications as of 06/05/2020  Medication Sig  . aspirin EC 81 MG tablet Take 81 mg by mouth daily.  Marland Kitchen atorvastatin (LIPITOR) 10 MG tablet TK 1 T PO QD  . Blood Glucose Monitoring Suppl (FREESTYLE LITE) DEVI Freestyle lite meter to test blood glucose four times daily  . busPIRone (BUSPAR) 5 MG tablet buspirone 5 mg tablet  . Dulaglutide (TRULICITY) 0.75 MG/0.5ML SOPN Inject 0.75 mg into the skin once a week.  Marland Kitchen glucose blood (FREESTYLE LITE) test strip Test blood glucose 2  times daily  . insulin degludec (TRESIBA FLEXTOUCH) 100 UNIT/ML FlexTouch Pen Inject 20 Units into the skin at bedtime.  . Insulin Pen  Needle (BD PEN NEEDLE NANO U/F) 32G X 4 MM MISC Once daily  . Insulin Pen Needle (BD PEN NEEDLE NANO U/F) 32G X 4 MM MISC 1 each by Does not apply route 4 (four) times daily.  . Lancets (FREESTYLE) lancets Test blod glucose four times daily. Use as instructed  . lisinopril (ZESTRIL) 10 MG tablet Take 1 tablet (10 mg total) by mouth daily.  . Vitamin D, Ergocalciferol, (DRISDOL) 1.25 MG (50000 UNIT) CAPS capsule Take 1 capsule (50,000 Units total) by mouth every 7 (seven) days.  . [DISCONTINUED] Dulaglutide (TRULICITY) 0.75 MG/0.5ML SOPN Inject 0.5 mLs (0.75 mg total) into the skin once a week.  . [DISCONTINUED] glucose blood (FREESTYLE LITE) test strip Test blood glucose four times daily  . [DISCONTINUED] insulin degludec (TRESIBA FLEXTOUCH) 100 UNIT/ML FlexTouch Pen Inject 0.2 mLs (20 Units total) into the skin daily.  . [DISCONTINUED] Vitamin D, Ergocalciferol, (DRISDOL) 1.25 MG (50000 UNIT) CAPS capsule Take 1 capsule (50,000 Units total) by mouth every 7 (seven) days.   No facility-administered  encounter medications on file as of 06/05/2020.    ALLERGIES: Allergies  Allergen Reactions  . Shellfish Allergy     VACCINATION STATUS: Immunization History  Administered Date(s) Administered  . Tdap 04/10/2015    Diabetes She presents for her follow-up diabetic visit. She has type 2 diabetes mellitus. Onset time: She was diagnosed at approximate age of 44 years.  She did have a gestational diabetes during her first pregnancy 19 years ago. Her disease course has been improving. There are no hypoglycemic associated symptoms. Pertinent negatives for hypoglycemia include no confusion, headaches, pallor or seizures. Pertinent negatives for diabetes include no blurred vision, no chest pain, no fatigue, no polydipsia, no polyphagia and no polyuria. There are no hypoglycemic complications. Symptoms are improving. There are no diabetic complications. Risk factors for coronary artery disease include diabetes mellitus and dyslipidemia. Current diabetic treatments: She is not tolerating her Metformin.  Currently taking Trulicity 0.75 mg subcutaneously weekly. Her weight is stable. She is following a generally unhealthy diet. When asked about meal planning, she reported none. She has not had a previous visit with a dietitian. She rarely participates in exercise. Her home blood glucose trend is increasing steadily. Her breakfast blood glucose range is generally >200 mg/dl. Her lunch blood glucose range is generally >200 mg/dl. Her dinner blood glucose range is generally >200 mg/dl. Her bedtime blood glucose range is generally >200 mg/dl. Her overall blood glucose range is >200 mg/dl. (She presents with significantly improved glycemic profile, point-of-care A1c of 6.5% improving from 11.5%.  She does not report any hypoglycemia.  ) An ACE inhibitor/angiotensin II receptor blocker is not being taken. Eye exam is not current.  Hyperlipidemia This is a chronic problem. The current episode started more than 1 year  ago. The problem is uncontrolled. Exacerbating diseases include diabetes. Pertinent negatives include no chest pain, myalgias or shortness of breath. Current antihyperlipidemic treatment includes statins. Risk factors for coronary artery disease include diabetes mellitus, dyslipidemia and family history.    Review of Systems  Constitutional: Negative for chills, fatigue, fever and unexpected weight change.  HENT: Negative for trouble swallowing and voice change.   Eyes: Negative for blurred vision and visual disturbance.  Respiratory: Negative for cough, shortness of breath and wheezing.   Cardiovascular: Negative for chest pain, palpitations and leg swelling.  Gastrointestinal: Negative for diarrhea, nausea and vomiting.  Endocrine: Negative for cold intolerance, heat intolerance, polydipsia, polyphagia and polyuria.  Musculoskeletal: Negative for arthralgias  and myalgias.  Skin: Negative for color change, pallor, rash and wound.  Neurological: Negative for seizures and headaches.  Psychiatric/Behavioral: Negative for confusion and suicidal ideas.    Objective:    Vitals with BMI 06/05/2020 01/20/2020 01/20/2020  Height     Weight 147 lbs 137 lbs 10 oz 137 lbs 13 oz  BMI 26.88 25.16 25.2  Systolic 144 - 166  Diastolic 88 - 106  Pulse 100 - 92    BP (!) 144/88   Pulse 100   Ht  (1.575 m)   Wt 147 lb (66.7 kg)   BMI 26.89 kg/m   Wt Readings from Last 3 Encounters:  06/05/20 147 lb (66.7 kg)  01/20/20 137 lb 9.6 oz (62.4 kg)  01/20/20 137 lb 12.8 oz (62.5 kg)     Physical Exam Constitutional:      Appearance: She is well-developed.  HENT:     Head: Normocephalic and atraumatic.  Neck:     Thyroid: No thyromegaly.     Trachea: No tracheal deviation.  Cardiovascular:     Rate and Rhythm: Normal rate and regular rhythm.  Pulmonary:     Effort: Pulmonary effort is normal.  Abdominal:     Tenderness: There is no abdominal tenderness. There is no  guarding.  Musculoskeletal:        General: Normal range of motion.     Cervical back: Normal range of motion and neck supple.  Skin:    General: Skin is warm and dry.     Coloration: Skin is not pale.     Findings: No erythema or rash.  Neurological:     Mental Status: She is alert and oriented to person, place, and time.     Cranial Nerves: No cranial nerve deficit.     Coordination: Coordination normal.     Deep Tendon Reflexes: Reflexes are normal and symmetric.  Psychiatric:        Judgment: Judgment normal.      CMP ( most recent) CMP     Component Value Date/Time   NA 140 05/19/2020 0750   K 4.1 05/19/2020 0750   CL 107 05/19/2020 0750   CO2 28 05/19/2020 0750   GLUCOSE 126 (H) 05/19/2020 0750   BUN 13 05/19/2020 0750   BUN 18 10/24/2019 0000   CREATININE 0.66 05/19/2020 0750   CALCIUM 9.1 05/19/2020 0750   PROT 6.1 05/19/2020 0750   AST 29 05/19/2020 0750   ALT 40 (H) 05/19/2020 0750   BILITOT 0.3 05/19/2020 0750   GFRNONAA 109 05/19/2020 0750   GFRAA 126 05/19/2020 0750     Diabetic Labs (most recent): Lab Results  Component Value Date   HGBA1C 6.5 (A) 06/05/2020   HGBA1C 9.7 (A) 01/20/2020   HGBA1C 11.4 10/24/2019   Recent Results (from the past 2160 hour(s))  COMPLETE METABOLIC PANEL WITH GFR     Status: Abnormal   Collection Time: 05/19/20  7:50 AM  Result Value Ref Range   Glucose, Bld 126 (H) 65 - 99 mg/dL    Comment: .            Fasting reference interval . For someone without known diabetes, a glucose value >125 mg/dL indicates that they may have diabetes and this should be confirmed with a follow-up test. .    BUN 13 7 - 25 mg/dL   Creat 1.61 0.96 - 0.45 mg/dL   GFR, Est Non African American 109 > OR = 60 mL/min/1.25m2  GFR, Est African American 126 > OR = 60 mL/min/1.33m2   BUN/Creatinine Ratio NOT APPLICABLE 6 - 22 (calc)   Sodium 140 135 - 146 mmol/L   Potassium 4.1 3.5 - 5.3 mmol/L   Chloride 107 98 - 110 mmol/L   CO2 28 20 -  32 mmol/L   Calcium 9.1 8.6 - 10.2 mg/dL   Total Protein 6.1 6.1 - 8.1 g/dL   Albumin 3.8 3.6 - 5.1 g/dL   Globulin 2.3 1.9 - 3.7 g/dL (calc)   AG Ratio 1.7 1.0 - 2.5 (calc)   Total Bilirubin 0.3 0.2 - 1.2 mg/dL   Alkaline phosphatase (APISO) 65 31 - 125 U/L   AST 29 10 - 30 U/L   ALT 40 (H) 6 - 29 U/L  Lipid panel     Status: None   Collection Time: 05/19/20  7:50 AM  Result Value Ref Range   Cholesterol 154 <200 mg/dL   HDL 73 > OR = 50 mg/dL   Triglycerides 161 <096 mg/dL   LDL Cholesterol (Calc) 60 mg/dL (calc)    Comment: Reference range: <100 . Desirable range <100 mg/dL for primary prevention;   <70 mg/dL for patients with CHD or diabetic patients  with > or = 2 CHD risk factors. Marland Kitchen LDL-C is now calculated using the Martin-Hopkins  calculation, which is a validated novel method providing  better accuracy than the Friedewald equation in the  estimation of LDL-C.  Horald Pollen et al. Lenox Ahr. 0454;098(11): 2061-2068  (http://education.[facility].com/faq/FAQ164)    Total CHOL/HDL Ratio 2.1 <5.0 (calc)   Non-HDL Cholesterol (Calc) 81 <914 mg/dL (calc)    Comment: For patients with diabetes plus 1 major ASCVD risk  factor, treating to a non-HDL-C goal of <100 mg/dL  (LDL-C of <78 mg/dL) is considered a therapeutic  option.   TSH     Status: None   Collection Time: 05/19/20  7:50 AM  Result Value Ref Range   TSH 3.08 mIU/L    Comment:           Reference Range .           > or = 20 Years  0.40-4.50 .                Pregnancy Ranges           First trimester    0.26-2.66           Second trimester   0.55-2.73           Third trimester    0.43-2.91   T4, free     Status: None   Collection Time: 05/19/20  7:50 AM  Result Value Ref Range   Free T4 1.1 0.8 - 1.8 ng/dL  VITAMIN D 25 Hydroxy (Vit-D Deficiency, Fractures)     Status: Abnormal   Collection Time: 05/19/20  7:50 AM  Result Value Ref Range   Vit D, 25-Hydroxy 9 (L) 30 - 100 ng/mL    Comment: Vitamin D  Status         25-OH Vitamin D: . Deficiency:                    <20 ng/mL Insufficiency:             20 - 29 ng/mL Optimal:                 > or = 30 ng/mL . For 25-OH Vitamin D testing on patients on  D2-supplementation and patients  for whom quantitation  of D2 and D3 fractions is required, the [facility]ureD(TM) 25-OH VIT D, (D2,D3), LC/MS/MS is recommended: order  code [id number] (patients >5936yrs). See Note 1 . Note 1 . For additional information, please refer to  http://education.[facility].com/faq/FAQ199  (This link is being provided for informational/ educational purposes only.)   HgB A1c     Status: Abnormal   Collection Time: 06/05/20  8:54 AM  Result Value Ref Range   Hemoglobin A1C 6.5 (A) 4.0 - 5.6 %   HbA1c POC (<> result, manual entry)     HbA1c, POC (prediabetic range)     HbA1c, POC (controlled diabetic range)        Assessment & Plan:   1. Uncontrolled type 2 diabetes mellitus with hyperglycemia (HCC)  - Anne Wright has currently uncontrolled symptomatic type 2 DM since 44 years of age.  She presents with significantly improved glycemic profile, point-of-care A1c of 6.5% improving from 11.5%.  She does not report any hypoglycemia.   - Recent labs reviewed. - I had a long discussion with her about the progressive nature of diabetes and the pathology behind its complications. -She did not report gross complications from her diabetes, however, she remains at a high risk for more acute and chronic complications which include CAD, CVA, CKD, retinopathy, and neuropathy. These are all discussed in detail with her.  - I have counseled her on diet  and weight management  by adopting a carbohydrate restricted/protein rich diet. Patient is encouraged to switch to  unprocessed or minimally processed     complex starch and increased protein intake (animal or plant source), fruits, and vegetables. -  she is advised to stick to a routine mealtimes to eat 3 meals  a day  and avoid unnecessary snacks ( to snack only to correct hypoglycemia).   - she  admits there is a room for improvement in her diet and drink choices. -  Suggestion is made for her to avoid simple carbohydrates  from her diet including Cakes, Sweet Desserts / Pastries, Ice Cream, Soda (diet and regular), Sweet Tea, Candies, Chips, Cookies, Sweet Pastries,  Store Bought Juices, Alcohol in Excess of  1-2 drinks a day, Artificial Sweeteners, Coffee Creamer, and "Sugar-free" Products. This will help patient to have stable blood glucose profile and potentially avoid unintended weight gain.   - she will be scheduled with Norm SaltPenny Crumpton, RDN, CDE for diabetes education.  - I have approached her with the following individualized plan to manage  her diabetes and patient agrees:   -Based on her presentation with significantly improved glycemic profile, she will not need prandial insulin for now.    -She will however continue to benefit from basal insulin.  She is advised to continue Tresiba 20 units nightly, associated with monitoring of blood glucose at least twice a day-daily before breakfast and at bedtime.  - she is encouraged to call clinic for blood glucose levels less than 70 or above 300 mg /dl. - she is advised to continue Trulicity 0.75 mg subcutaneously weekly,  therapeutically suitable for patient . -She does not tolerate Metformin.  She will be considered for low-dose glipizide if cost of Trulicity becomes an issue.     - Specific targets for  A1c;  LDL, HDL,  and Triglycerides were discussed with the patient.  2) Blood Pressure /Hypertension: Her blood pressure is still not controlled to target, however significantly better than last visit.  She is advised to continue lisinopril 10 mg p.o. daily at  breakfast.     3) Lipids/Hyperlipidemia:   Review of her recent lipid panel showed not controlled  LDL at 151 .  she  is advised to continue atorvastatin 10 mg p.o. daily at bedtime.   Side  effects and precautions discussed with her.  This medication will be increased during her subsequent visit.  4)  Weight/Diet:  Body mass index is 26.89 kg/m.   she is not a candidate for weight loss. Exercise, and detailed carbohydrates information provided  -  detailed on discharge instructions.  5) Chronic Care/Health Maintenance:  -she  is on Statin medications and  is encouraged to initiate and continue to follow up with Ophthalmology, Dentist,  Podiatrist at least yearly or according to recommendations, and advised to   stay away from smoking. I have recommended yearly flu vaccine and pneumonia vaccine at least every 5 years; moderate intensity exercise for up to 150 minutes weekly; and  sleep for at least 7 hours a day.  - she is  advised to maintain close follow up with Levonne Lapping, NP (Inactive) for primary care needs, as well as her other providers for optimal and coordinated care.  - Time spent on this patient care encounter:  35 min, of which > 50% was spent in  counseling and the rest reviewing her blood glucose logs , discussing her hypoglycemia and hyperglycemia episodes, reviewing her current and  previous labs / studies  ( including abstraction from other facilities) and medications  doses and developing a  long term treatment plan and documenting her care.   Please refer to Patient Instructions for Blood Glucose Monitoring and Insulin/Medications Dosing Guide"  in media tab for additional information. Please  also refer to " Patient Self Inventory" in the Media  tab for reviewed elements of pertinent patient history.  Nasrin Sandell participated in the discussions, expressed understanding, and voiced agreement with the above plans.  All questions were answered to her satisfaction. she is encouraged to contact clinic should she have any questions or concerns prior to her return visit.   Follow up plan: - Return in about 4 months (around 10/03/2020) for ABI in Office NV, Urine MA -  NV, Bring Meter and Logs- A1c in Office.  Marquis Lunch, MD [facility] [address] [place], [place] [address] Phone: [phone]  Fax: [phone]    06/05/2020, 9:28 AM  This note was partially dictated with voice recognition software. Similar sounding words can be transcribed inadequately or may not  be corrected upon review.

## 2020-06-05 NOTE — Patient Instructions (Signed)

## 2020-08-27 ENCOUNTER — Telehealth: Payer: Self-pay

## 2020-08-27 ENCOUNTER — Other Ambulatory Visit: Payer: Self-pay

## 2020-08-27 DIAGNOSIS — E1165 Type 2 diabetes mellitus with hyperglycemia: Secondary | ICD-10-CM

## 2020-08-27 MED ORDER — TRULICITY 0.75 MG/0.5ML ~~LOC~~ SOAJ: 0.7500 mg | SUBCUTANEOUS | 1 refills | Status: DC

## 2020-08-27 MED ORDER — TRESIBA FLEXTOUCH 100 UNIT/ML ~~LOC~~ SOPN: 20.0000 [IU] | PEN_INJECTOR | Freq: Every day | SUBCUTANEOUS | 1 refills | Status: DC

## 2020-08-27 NOTE — Telephone Encounter (Signed)
Sent in

## 2020-08-27 NOTE — Telephone Encounter (Signed)
Pt requesting refills on her trulicity and her tresbia. [facility] [address]

## 2020-08-31 ENCOUNTER — Other Ambulatory Visit: Payer: Self-pay

## 2020-08-31 DIAGNOSIS — E1165 Type 2 diabetes mellitus with hyperglycemia: Secondary | ICD-10-CM

## 2020-08-31 MED ORDER — TRULICITY 0.75 MG/0.5ML ~~LOC~~ SOAJ: 0.7500 mg | SUBCUTANEOUS | 1 refills | Status: DC

## 2020-08-31 MED ORDER — TRESIBA FLEXTOUCH 100 UNIT/ML ~~LOC~~ SOPN: 20.0000 [IU] | PEN_INJECTOR | Freq: Every day | SUBCUTANEOUS | 1 refills | Status: DC

## 2020-08-31 NOTE — Telephone Encounter (Signed)
Could you re send? She said [facility] is stating they did not receive these.

## 2020-08-31 NOTE — Telephone Encounter (Signed)
resent

## 2020-09-07 NOTE — Telephone Encounter (Signed)
Called [facility] on [address] and spoke with Fleet Contras which stated they do have pt's Rx for trulicity and tresiba but they need updated insurance information. Called pt and made her aware.

## 2020-09-07 NOTE — Telephone Encounter (Signed)
Pt is calling and states that she has went to this pharmacy to try and pick up the medications and the pharmacy is telling her they are at [facility] pharmacy and she said she can not get her medicine filled there. Pt requesting the nurse call the pharmacy to see what is going on, she has been without medicine 2 weeks now.

## 2020-09-09 ENCOUNTER — Other Ambulatory Visit: Payer: Self-pay

## 2020-09-09 MED ORDER — INSULIN GLARGINE 100 UNIT/ML SOLOSTAR PEN
20.0000 [IU] | PEN_INJECTOR | Freq: Every day | SUBCUTANEOUS | 1 refills | Status: DC
Start: 2020-09-09 — End: 2020-10-20

## 2020-09-16 ENCOUNTER — Emergency Department (HOSPITAL_COMMUNITY)
Admission: EM | Admit: 2020-09-16 | Discharge: 2020-09-17 | Disposition: A | Discharge status: Home / Self Care | Payer: Commercial Managed Care - PPO | Attending: Emergency Medicine | Admitting: Emergency Medicine

## 2020-09-16 ENCOUNTER — Emergency Department (HOSPITAL_COMMUNITY): Payer: Commercial Managed Care - PPO

## 2020-09-16 ENCOUNTER — Encounter (HOSPITAL_COMMUNITY): Payer: Self-pay

## 2020-09-16 ENCOUNTER — Other Ambulatory Visit: Payer: Self-pay

## 2020-09-16 DIAGNOSIS — Z87891 Personal history of nicotine dependence: Secondary | ICD-10-CM | POA: Insufficient documentation

## 2020-09-16 DIAGNOSIS — R059 Cough, unspecified: Secondary | ICD-10-CM | POA: Diagnosis not present

## 2020-09-16 DIAGNOSIS — Z79899 Other long term (current) drug therapy: Secondary | ICD-10-CM | POA: Insufficient documentation

## 2020-09-16 DIAGNOSIS — R509 Fever, unspecified: Secondary | ICD-10-CM | POA: Diagnosis not present

## 2020-09-16 DIAGNOSIS — Z20822 Contact with and (suspected) exposure to covid-19: Secondary | ICD-10-CM | POA: Insufficient documentation

## 2020-09-16 DIAGNOSIS — Z7982 Long term (current) use of aspirin: Secondary | ICD-10-CM | POA: Insufficient documentation

## 2020-09-16 DIAGNOSIS — E1165 Type 2 diabetes mellitus with hyperglycemia: Secondary | ICD-10-CM | POA: Insufficient documentation

## 2020-09-16 DIAGNOSIS — Z794 Long term (current) use of insulin: Secondary | ICD-10-CM | POA: Diagnosis not present

## 2020-09-16 DIAGNOSIS — I10 Essential (primary) hypertension: Secondary | ICD-10-CM | POA: Diagnosis not present

## 2020-09-16 DIAGNOSIS — R072 Precordial pain: Secondary | ICD-10-CM | POA: Diagnosis present

## 2020-09-16 DIAGNOSIS — R0789 Other chest pain: Secondary | ICD-10-CM

## 2020-09-16 DIAGNOSIS — R079 Chest pain, unspecified: Secondary | ICD-10-CM

## 2020-09-16 HISTORY — DX: Essential (primary) hypertension: I10

## 2020-09-16 HISTORY — DX: Pure hypercholesterolemia, unspecified: E78.00

## 2020-09-16 LAB — BASIC METABOLIC PANEL
Anion gap: 7 (ref 5–15)
BUN: 17 mg/dL (ref 6–20)
CO2: 27 mmol/L (ref 22–32)
Calcium: 8.8 mg/dL — ABNORMAL LOW (ref 8.9–10.3)
Chloride: 102 mmol/L (ref 98–111)
Creatinine, Ser: 0.86 mg/dL (ref 0.44–1.00)
GFR, Estimated: >60 mL/min (ref >60)
Glucose, Bld: 152 mg/dL — ABNORMAL HIGH (ref 70–99)
Potassium: 4.4 mmol/L (ref 3.5–5.1)
Sodium: 136 mmol/L (ref 135–145)

## 2020-09-16 LAB — CBC
HCT: 36.1 % (ref 36.0–46.0)
Hemoglobin: 12 g/dL (ref 12.0–15.0)
MCH: 31.6 pg (ref 26.0–34.0)
MCHC: 33.2 g/dL (ref 30.0–36.0)
MCV: 95 fL (ref 80.0–100.0)
Platelets: 286 10*3/uL (ref 150–400)
RBC: 3.8 MIL/uL — ABNORMAL LOW (ref 3.87–5.11)
RDW: 12.5 % (ref 11.5–15.5)
WBC: 6 10*3/uL (ref 4.0–10.5)
nRBC: 0 % (ref 0.0–0.2)

## 2020-09-16 LAB — POC URINE PREG, ED: Preg Test, Ur: NEGATIVE

## 2020-09-16 LAB — TROPONIN I (HIGH SENSITIVITY)
Troponin I (High Sensitivity): 8 ng/L (ref <18)
Troponin I (High Sensitivity): 7 ng/L (ref <18)

## 2020-09-16 MED ORDER — SODIUM CHLORIDE 0.9 % IV BOLUS
1000.0000 mL | Freq: Once | INTRAVENOUS | Status: AC
  Administered 2020-09-16: 1000 mL via INTRAVENOUS

## 2020-09-16 MED ORDER — ACETAMINOPHEN 500 MG PO TABS
1000.0000 mg | ORAL_TABLET | Freq: Once | ORAL | Status: AC
  Administered 2020-09-16: 1000 mg via ORAL
  Filled 2020-09-16: qty 2

## 2020-09-16 MED ORDER — ALUM & MAG HYDROXIDE-SIMETH 200-200-20 MG/5ML PO SUSP
30.0000 mL | Freq: Once | ORAL | Status: AC
  Administered 2020-09-16: 30 mL via ORAL
  Filled 2020-09-16: qty 30

## 2020-09-16 MED ORDER — FAMOTIDINE 20 MG PO TABS
20.0000 mg | ORAL_TABLET | Freq: Once | ORAL | Status: AC
  Administered 2020-09-16: 20 mg via ORAL
  Filled 2020-09-16: qty 1

## 2020-09-16 NOTE — ED Triage Notes (Signed)
Pt to er, pt states that she is here for some L side pain and chest pain.  Pt states that her pain started off and on yesterday, states that her pain has been more steady today.  States that nothing seems to make her pain better or worse.  Pt states that she also felt like she had fever and chills today.  Pt reports vomiting.  Reports that she is vaccinated for covid.  Denies exposure

## 2020-09-16 NOTE — ED Provider Notes (Signed)
[facility] EMERGENCY DEPARTMENT Provider Note   CSN: [id number] Arrival date & time: 09/16/20  1915     History Chief Complaint  Patient presents with  . Chest Pain    Anne Wright is a 45 y.o. female.  Patient c/o midline, lower sternal pain for past couple days. Symptoms acute onset, at rest, moderate, constant, dull, non radiating, without specific exacerbating or alleviating factors. No pleuritic pain. No associated sob, nv or diaphoresis. Denies heartburn. No chest wall injury or strain. No change whether upright or supine. Denies abd, neck or back pain. +occasional non prod cough. +fevers. No sore throat or runny nose. No known covid exposure. Denies dysuria or gu c/o. No abd or pelvic pain. No vaginal discharge or bleeding.  Pt denies skin changes or rash. No leg pain or swelling. No fam hx premature cad.   The history is provided by the patient.  Chest Pain Associated symptoms: cough and fever   Associated symptoms: no abdominal pain, no back pain, no headache, no shortness of breath and no vomiting        Past Medical History:  Diagnosis Date  . Diabetes mellitus without complication (HCC)   . High cholesterol   . Hypertension     Patient Active Problem List   Diagnosis Date Noted  . Vitamin D deficiency 06/05/2020  . Essential hypertension, benign 01/20/2020  . Mixed hyperlipidemia 01/06/2020  . Uncontrolled type 2 diabetes mellitus with hyperglycemia (HCC) 10/08/2018    History reviewed. No pertinent surgical history.   OB History   No obstetric history on file.     Family History  Problem Relation Age of Onset  . Diabetes Mother   . Hypertension Mother   . Asthma Son   . Breast cancer Maternal Grandmother     Social History   Tobacco Use  . Smoking status: Former Smoker    Quit date: 06/29/2018    Years since quitting: 2.2  . Smokeless tobacco: Never Used  Substance Use Topics  . Alcohol use: Yes  . Drug use: No    Home  Medications Prior to Admission medications   Medication Sig Start Date End Date Taking? Authorizing Provider  aspirin EC 81 MG tablet Take 81 mg by mouth daily.    [provider]  atorvastatin (LIPITOR) 10 MG tablet TK 1 T PO QD 09/28/18   [provider]  Blood Glucose Monitoring Suppl (FREESTYLE LITE) DEVI Freestyle lite meter to test blood glucose four times daily 01/13/20   Roma Kayser, MD  busPIRone (BUSPAR) 5 MG tablet buspirone 5 mg tablet    [provider]  Dulaglutide (TRULICITY) 0.75 MG/0.5ML SOPN Inject 0.75 mg into the skin once a week. 08/31/20   Roma Kayser, MD  glucose blood (FREESTYLE LITE) test strip Test blood glucose 2  times daily 06/05/20   Roma Kayser, MD  insulin glargine (LANTUS) 100 UNIT/ML Solostar Pen Inject 20 Units into the skin at bedtime. 09/09/20   Roma Kayser, MD  Insulin Pen Needle (BD PEN NEEDLE NANO U/F) 32G X 4 MM MISC Once daily 10/08/18   Shamleffer, Konrad Dolores, MD  Insulin Pen Needle (BD PEN NEEDLE NANO U/F) 32G X 4 MM MISC 1 each by Does not apply route 4 (four) times daily. 01/20/20   Roma Kayser, MD  Lancets (FREESTYLE) lancets Test blod glucose four times daily. Use as instructed 01/13/20   Roma Kayser, MD  lisinopril (ZESTRIL) 10 MG tablet Take 1 tablet (  10 mg total) by mouth daily. 06/05/20   Roma Kayser, MD  Vitamin D, Ergocalciferol, (DRISDOL) 1.25 MG (50000 UNIT) CAPS capsule Take 1 capsule (50,000 Units total) by mouth every 7 (seven) days. 06/05/20   Roma Kayser, MD    Allergies    Shellfish allergy  Review of Systems   Review of Systems  Constitutional: Positive for fever.  HENT: Negative for sore throat.   Eyes: Negative for redness.  Respiratory: Positive for cough. Negative for shortness of breath.   Cardiovascular: Positive for chest pain. Negative for leg swelling.  Gastrointestinal: Negative for abdominal pain, diarrhea and  vomiting.  Genitourinary: Negative for dysuria and flank pain.  Musculoskeletal: Negative for back pain and neck pain.  Skin: Negative for rash.  Neurological: Negative for headaches.  Hematological: Does not bruise/bleed easily.  Psychiatric/Behavioral: Negative for confusion.    Physical Exam Updated Vital Signs BP (!) 152/110   Pulse (!) 110   Temp (!) 101 F (38.3 C) (Oral)   Resp 15   Ht 1.575 m (5\' 2" )   Wt 66.7 kg   SpO2 100%   BMI 26.89 kg/m   Physical Exam Vitals and nursing note reviewed.  Constitutional:      Appearance: Normal appearance. She is well-developed.  HENT:     Head: Atraumatic.     Nose: Nose normal.     Mouth/Throat:     Mouth: Mucous membranes are moist.     Pharynx: Oropharynx is clear. No oropharyngeal exudate or posterior oropharyngeal erythema.  Eyes:     General: No scleral icterus.    Conjunctiva/sclera: Conjunctivae normal.  Neck:     Trachea: No tracheal deviation.     Comments: No stiffness or rigidity.  Cardiovascular:     Rate and Rhythm: Normal rate and regular rhythm.     Pulses: Normal pulses.     Heart sounds: Normal heart sounds. No murmur heard. No friction rub. No gallop.   Pulmonary:     Effort: Pulmonary effort is normal. No respiratory distress.     Breath sounds: Normal breath sounds.  Chest:     Chest wall: Tenderness present.  Abdominal:     General: Bowel sounds are normal. There is no distension.     Palpations: Abdomen is soft. There is no mass.     Tenderness: There is no abdominal tenderness. There is no guarding or rebound.     Hernia: No hernia is present.  Genitourinary:    Comments: No cva tenderness.  Musculoskeletal:        General: No swelling or tenderness.     Cervical back: Normal range of motion and neck supple. No rigidity. No muscular tenderness.     Right lower leg: No edema.     Left lower leg: No edema.  Skin:    General: Skin is warm and dry.     Findings: No rash.     Comments: No  skin lesions, rash, or petechia.   Neurological:     Mental Status: She is alert.     Comments: Alert, speech normal.   Psychiatric:        Mood and Affect: Mood normal.     ED Results / Procedures / Treatments   Labs (all labs ordered are listed, but only abnormal results are displayed) Results for orders placed or performed during the hospital encounter of 09/16/20  Basic metabolic panel  Result Value Ref Range   Sodium 136 135 - 145 mmol/L  Potassium 4.4 3.5 - 5.1 mmol/L   Chloride 102 98 - 111 mmol/L   CO2 27 22 - 32 mmol/L   Glucose, Bld 152 (H) 70 - 99 mg/dL   BUN 17 6 - 20 mg/dL   Creatinine, Ser 6.33 0.44 - 1.00 mg/dL   Calcium 8.8 (L) 8.9 - 10.3 mg/dL   GFR, Estimated >35 >45 mL/min   Anion gap 7 5 - 15  CBC  Result Value Ref Range   WBC 6.0 4.0 - 10.5 K/uL   RBC 3.80 (L) 3.87 - 5.11 MIL/uL   Hemoglobin 12.0 12.0 - 15.0 g/dL   HCT 62.5 63.8 - 93.7 %   MCV 95.0 80.0 - 100.0 fL   MCH 31.6 26.0 - 34.0 pg   MCHC 33.2 30.0 - 36.0 g/dL   RDW 34.2 87.6 - 81.1 %   Platelets 286 150 - 400 K/uL   nRBC 0.0 0.0 - 0.2 %  POC urine preg, ED  Result Value Ref Range   Preg Test, Ur NEGATIVE NEGATIVE  Troponin I (High Sensitivity)  Result Value Ref Range   Troponin I (High Sensitivity) 8 <18 ng/L   DG Chest Port 1 View  Result Date: 09/16/2020 CLINICAL DATA:  Shortness of breath EXAM: PORTABLE CHEST 1 VIEW COMPARISON:  None. FINDINGS: The heart size and mediastinal contours are within normal limits. Both lungs are clear. The visualized skeletal structures are unremarkable. IMPRESSION: No active disease. Electronically Signed   By: Deatra Robinson M.D.   On: 09/16/2020 20:38    EKG EKG Interpretation  Date/Time:  Wednesday September 16 2020 19:57:49 EST Ventricular Rate:  111 PR Interval:  120 QRS Duration: 68 QT Interval:  332 QTC Calculation: 451 R Axis:   61 Text Interpretation: Sinus tachycardia No previous tracing Confirmed by Cathren Laine ([id number]) on 09/16/2020  8:11:49 PM   Radiology DG Chest Port 1 View  Result Date: 09/16/2020 CLINICAL DATA:  Shortness of breath EXAM: PORTABLE CHEST 1 VIEW COMPARISON:  None. FINDINGS: The heart size and mediastinal contours are within normal limits. Both lungs are clear. The visualized skeletal structures are unremarkable. IMPRESSION: No active disease. Electronically Signed   By: Deatra Robinson M.D.   On: 09/16/2020 20:38    Procedures Procedures   Medications Ordered in ED Medications  acetaminophen (TYLENOL) tablet 1,000 mg (1,000 mg Oral Given 09/16/20 2300)  sodium chloride 0.9 % bolus 1,000 mL (1,000 mLs Intravenous New Bag/Given 09/16/20 2301)  alum & mag hydroxide-simeth (MAALOX/MYLANTA) 200-200-20 MG/5ML suspension 30 mL (30 mLs Oral Given 09/16/20 2300)  famotidine (PEPCID) tablet 20 mg (20 mg Oral Given 09/16/20 2300)    ED Course  I have reviewed the triage vital signs and the nursing notes.  Pertinent labs & imaging results that were available during my care of the patient were reviewed by me and considered in my medical decision making (see chart for details).    MDM Rules/Calculators/A&P                         Iv ns. Continuous pulse ox and cardiac monitoring. Stat labs and imaging.   Reviewed nursing notes and prior charts for additional history.   Iv ns bolus. Acetaminophen po.   Covid swab sent.   Po fluids.   Initial labs reviewed/interpreted by me - first trop normal. Wbc normal.   CXR reviewed/interpreted by me - no pna.   2330, UA pending, delta trop pending, covid pending - signed out to  Dr Judd Lienelo to check labs when resulted, recheck pt, and dispo appropriately.    Final Clinical Impression(s) / ED Diagnoses Final diagnoses:  Chest pain    Rx / DC Orders ED Discharge Orders    None       Cathren LaineSteinl, Zidane Renner, MD 09/16/20 2332

## 2020-09-17 LAB — URINALYSIS, ROUTINE W REFLEX MICROSCOPIC
Bacteria, UA: NONE SEEN
Bilirubin Urine: NEGATIVE
Glucose, UA: NEGATIVE mg/dL
Ketones, ur: NEGATIVE mg/dL
Leukocytes,Ua: NEGATIVE
Nitrite: NEGATIVE
Protein, ur: 100 mg/dL — AB
Specific Gravity, Urine: 1.024 (ref 1.005–1.030)
pH: 5 (ref 5.0–8.0)

## 2020-09-17 LAB — RESP PANEL BY RT-PCR (FLU A&B, COVID) ARPGX2
Influenza A by PCR: NEGATIVE
Influenza B by PCR: NEGATIVE
SARS Coronavirus 2 by RT PCR: NEGATIVE

## 2020-09-17 MED ORDER — ONDANSETRON 8 MG PO TBDP: ORAL_TABLET | ORAL | 0 refills | Status: DC

## 2020-09-17 NOTE — ED Provider Notes (Signed)
Care assumed from Dr. Denton Lank at shift change.  Patient presents with complaints of chest pain, fever, and occasional cough.  This has worsened over the past 2 days.  She also reports feeling nauseated, decreased appetite, and generalized malaise.  Care signed out to me awaiting results of a Covid test, urinalysis, both of which have returned as unremarkable.  Patient has been reevaluated, reexamined, and reassessed and seems to be feeling better.  As all of her tests have returned unremarkable, I suspect some sort of viral etiology.  Patient to be discharged with fluids, Zofran, and return as needed if her symptoms worsen or change.   Geoffery Lyons, MD 09/17/20 [phone]

## 2020-09-17 NOTE — Discharge Instructions (Addendum)
Begin taking Zofran as prescribed as needed for nausea.  Take Tylenol 1000 mg rotated with ibuprofen 600 mg every 4 hours as needed for fever.  Return to the emergency department if you develop severe chest pain, difficulty breathing, or other new and concerning symptoms.

## 2020-09-23 NOTE — Telephone Encounter (Signed)
Called [facility] on [address] and spoke with Pentress. She stated they do have the Rxs for Trulicity and Lantus which was sent in to replace Tresiba on pt's profile. Advised her pt needs Rxs to be filled, understanding voiced. Tried to call pt but did not receive an answer, was unable to leave a message.

## 2020-09-23 NOTE — Telephone Encounter (Signed)
Pt just called in and said she has been without medicine for 3 weeks because she called [facility] again and they are telling her that they do not have this it is at [facility] still .please advise.

## 2020-09-24 NOTE — Telephone Encounter (Signed)
Pt made aware that I had spoken with Morrie Sheldon at [facility] on [address]  they had her Rxs for Trulicity and lantus on file, I made them aware that pt needs Rxs filled and understanding was voiced. Pt voiced understanding and stated she would call [facility] again.

## 2020-10-09 ENCOUNTER — Ambulatory Visit: Payer: Commercial Managed Care - PPO | Admitting: "Endocrinology

## 2020-10-20 ENCOUNTER — Other Ambulatory Visit: Payer: Self-pay

## 2020-10-20 ENCOUNTER — Ambulatory Visit (INDEPENDENT_AMBULATORY_CARE_PROVIDER_SITE_OTHER): Payer: Commercial Managed Care - PPO | Admitting: "Endocrinology

## 2020-10-20 ENCOUNTER — Encounter: Payer: Self-pay | Admitting: "Endocrinology

## 2020-10-20 VITALS — BP 150/97 | HR 97 | Ht 62.0 in | Wt 159.8 lb

## 2020-10-20 DIAGNOSIS — I10 Essential (primary) hypertension: Secondary | ICD-10-CM | POA: Diagnosis not present

## 2020-10-20 DIAGNOSIS — E782 Mixed hyperlipidemia: Secondary | ICD-10-CM

## 2020-10-20 DIAGNOSIS — E1165 Type 2 diabetes mellitus with hyperglycemia: Secondary | ICD-10-CM | POA: Diagnosis not present

## 2020-10-20 LAB — POCT GLYCOSYLATED HEMOGLOBIN (HGB A1C): HbA1c, POC (controlled diabetic range): 8.2 % — AB (ref 0.0–7.0)

## 2020-10-20 LAB — POCT UA - MICROALBUMIN
Creatinine, POC: 300 mg/dL
Microalbumin Ur, POC: 150 mg/L

## 2020-10-20 MED ORDER — ACCU-CHEK GUIDE VI STRP: ORAL_STRIP | 2 refills | Status: DC

## 2020-10-20 MED ORDER — INSULIN GLARGINE 100 UNIT/ML SOLOSTAR PEN: 30.0000 [IU] | PEN_INJECTOR | Freq: Every day | SUBCUTANEOUS | 1 refills | Status: DC

## 2020-10-20 MED ORDER — ATORVASTATIN CALCIUM 20 MG PO TABS: 20.0000 mg | ORAL_TABLET | Freq: Every day | ORAL | 1 refills | Status: DC

## 2020-10-20 MED ORDER — LISINOPRIL 20 MG PO TABS: 20.0000 mg | ORAL_TABLET | Freq: Every day | ORAL | 1 refills | Status: DC

## 2020-10-20 MED ORDER — GLIPIZIDE ER 5 MG PO TB24: 5.0000 mg | ORAL_TABLET | Freq: Every day | ORAL | 3 refills | Status: DC

## 2020-10-20 MED ORDER — ACCU-CHEK GUIDE ME W/DEVICE KIT: 1.0000 | PACK | 0 refills | Status: DC

## 2020-10-20 NOTE — Patient Instructions (Signed)
                                     Advice for Weight Management  -For most of us the best way to lose weight is by diet management. Generally speaking, diet management means consuming less calories intentionally which over time brings about progressive weight loss.  This can be achieved more effectively by restricting carbohydrate consumption to the minimum possible.  So, it is critically important to know your numbers: how much calorie you are consuming and how much calorie you need. More importantly, our carbohydrates sources should be unprocessed or minimally processed complex starch food items.   Sometimes, it is important to balance nutrition by increasing protein intake (animal or plant source), fruits, and vegetables.  -Sticking to a routine mealtime to eat 3 meals a day and avoiding unnecessary snacks is shown to have a big role in weight control. Under normal circumstances, the only time we lose real weight is when we are hungry, so allow hunger to take place- hunger means no food between meal times, only water.  It is not advisable to starve.   -It is better to avoid simple carbohydrates including: Cakes, Sweet Desserts, Ice Cream, Soda (diet and regular), Sweet Tea, Candies, Chips, Cookies, Store Bought Juices, Alcohol in Excess of  1-2 drinks a day, Lemonade,  Artificial Sweeteners, Doughnuts, Coffee Creamers, "Sugar-free" Products, etc, etc.  This is not a complete list.....    -Consulting with certified diabetes educators is proven to provide you with the most accurate and current information on diet.  Also, you may be  interested in discussing diet options/exchanges , we can schedule a visit with Anne Wright, RDN, CDE for individualized nutrition education.  -Exercise: If you are able: 30 -60 minutes a day ,4 days a week, or 150 minutes a week.  The longer the better.  Combine stretch, strength, and aerobic activities.  If you were told in the  past that you have high risk for cardiovascular diseases, you may seek evaluation by your heart doctor prior to initiating moderate to intense exercise programs.                                  Additional Care Considerations for Diabetes   -Diabetes  is a chronic disease.  The most important care consideration is regular follow-up with your diabetes care provider with the goal being avoiding or delaying its complications and to take advantage of advances in medications and technology.    -Type 2 diabetes is known to coexist with other important comorbidities such as high blood pressure and high cholesterol.  It is critical to control not only the diabetes but also the high blood pressure and high cholesterol to minimize and delay the risk of complications including coronary artery disease, stroke, amputations, blindness, etc.    - Studies showed that people with diabetes will benefit from a class of medications known as ACE inhibitors and statins.  Unless there are specific reasons not to be on these medications, the standard of care is to consider getting one from these groups of medications at an optimal doses.  These medications are generally considered safe and proven to help protect the heart and the kidneys.    - People with diabetes are encouraged to initiate and maintain regular follow-up with eye doctors, foot   doctors, dentists , and if necessary heart and kidney doctors.     - It is highly recommended that people with diabetes quit smoking or stay away from smoking, and get yearly  flu vaccine and pneumonia vaccine at least every 5 years.  One other important lifestyle recommendation is to ensure adequate sleep - at least 6-7 hours of uninterrupted sleep at night.  -Exercise: If you are able: 30 -60 minutes a day, 4 days a week, or 150 minutes a week.  The longer the better.  Combine stretch, strength, and aerobic activities.  If you were told in the past that you have high risk for  cardiovascular diseases, you may seek evaluation by your heart doctor prior to initiating moderate to intense exercise programs.          

## 2020-10-20 NOTE — Progress Notes (Signed)
10/20/2020, 9:26 AM  Endocrinology follow-up note  Subjective:    Patient ID: Anne Wright, female    DOB: 28-Sep-1975.  Anne Wright is being seen in follow-up after she was seen in consultation for management of currently uncontrolled symptomatic diabetes requested by  Dianna Rossetti, NP (Inactive).   Past Medical History:  Diagnosis Date  . Diabetes mellitus without complication ([place])   . High cholesterol   . Hypertension     History reviewed. No pertinent surgical history.  Social History   Socioeconomic History  . Marital status: Single    Spouse name: Not on file  . Number of children: Not on file  . Years of education: Not on file  . Highest education level: Not on file  Occupational History  . Not on file  Tobacco Use  . Smoking status: Former Smoker    Quit date: 06/29/2018    Years since quitting: 2.3  . Smokeless tobacco: Never Used  Vaping Use  . Vaping Use: Never used  Substance and Sexual Activity  . Alcohol use: Yes  . Drug use: No  . Sexual activity: Not on file  Other Topics Concern  . Not on file  Social History Narrative  . Not on file   Social Determinants of Health   Financial Resource Strain: Not on file  Food Insecurity: Not on file  Transportation Needs: Not on file  Physical Activity: Not on file  Stress: Not on file  Social Connections: Not on file    Family History  Problem Relation Age of Onset  . Diabetes Mother   . Hypertension Mother   . Asthma Son   . Breast cancer Maternal Grandmother     Outpatient Encounter Medications as of 10/20/2020  Medication Sig  . Blood Glucose Monitoring Suppl (ACCU-CHEK GUIDE ME) w/Device KIT 1 Piece by Does not apply route as directed.  Marland Kitchen glucose blood (ACCU-CHEK GUIDE) test strip Use as instructed  . aspirin EC 81 MG tablet Take 81 mg by mouth daily.  Marland Kitchen atorvastatin (LIPITOR) 20 MG tablet Take 1  tablet (20 mg total) by mouth at bedtime.  . busPIRone (BUSPAR) 5 MG tablet buspirone 5 mg tablet  . insulin glargine (LANTUS) 100 UNIT/ML Solostar Pen Inject 30 Units into the skin at bedtime.  . Insulin Pen Needle (BD PEN NEEDLE NANO U/F) 32G X 4 MM MISC Once daily  . Insulin Pen Needle (BD PEN NEEDLE NANO U/F) 32G X 4 MM MISC 1 each by Does not apply route 4 (four) times daily.  . Lancets (FREESTYLE) lancets Test blod glucose four times daily. Use as instructed  . lisinopril (ZESTRIL) 20 MG tablet Take 1 tablet (20 mg total) by mouth daily.  . ondansetron (ZOFRAN ODT) 8 MG disintegrating tablet 55m ODT q4 hours prn nausea  . Vitamin D, Ergocalciferol, (DRISDOL) 1.25 MG (50000 UNIT) CAPS capsule Take 1 capsule (50,000 Units total) by mouth every 7 (seven) days.  . [DISCONTINUED] atorvastatin (LIPITOR) 10 MG tablet TK 1 T PO QD  . [DISCONTINUED] Blood Glucose Monitoring Suppl (FREESTYLE LITE) DEVI Freestyle lite meter to test blood  glucose four times daily  . [DISCONTINUED] Dulaglutide (TRULICITY) 0.93 OI/7.1IW SOPN Inject 0.75 mg into the skin once a week.  . [DISCONTINUED] glucose blood (FREESTYLE LITE) test strip Test blood glucose 2  times daily  . [DISCONTINUED] insulin glargine (LANTUS) 100 UNIT/ML Solostar Pen Inject 20 Units into the skin at bedtime.  . [DISCONTINUED] lisinopril (ZESTRIL) 10 MG tablet Take 1 tablet (10 mg total) by mouth daily.   No facility-administered encounter medications on file as of 10/20/2020.    ALLERGIES: Allergies  Allergen Reactions  . Shellfish Allergy     VACCINATION STATUS: Immunization History  Administered Date(s) Administered  . Tdap 04/10/2015    Diabetes She presents for her follow-up diabetic visit. She has type 2 diabetes mellitus. Onset time: She was diagnosed at approximate age of 45 years.  She did have a gestational diabetes during her first pregnancy 19 years ago. Her disease course has been worsening. There are no hypoglycemic  associated symptoms. Pertinent negatives for hypoglycemia include no confusion, headaches, pallor or seizures. Pertinent negatives for diabetes include no blurred vision, no chest pain, no fatigue, no polydipsia, no polyphagia and no polyuria. There are no hypoglycemic complications. Symptoms are worsening. There are no diabetic complications. Risk factors for coronary artery disease include diabetes mellitus and dyslipidemia. Current diabetic treatments: She is not tolerating her Metformin.  Currently taking Trulicity 5.80 mg subcutaneously weekly. Her weight is fluctuating dramatically. She is following a generally unhealthy diet. When asked about meal planning, she reported none. She has not had a previous visit with a dietitian. She rarely participates in exercise. Her home blood glucose trend is increasing steadily. (She presents without any logs nor meter.  Her A1c is 8.2% increasing from 6.5%.  She reports that she lost coverage for testing supplies as well as for her Trulicity.  She denies any hypoglycemia.  ) An ACE inhibitor/angiotensin II receptor blocker is not being taken. Eye exam is not current.  Hyperlipidemia This is a chronic problem. The current episode started more than 1 year ago. The problem is uncontrolled. Exacerbating diseases include diabetes. Pertinent negatives include no chest pain, myalgias or shortness of breath. Current antihyperlipidemic treatment includes statins. Risk factors for coronary artery disease include diabetes mellitus, dyslipidemia and family history.    Review of Systems  Constitutional: Negative for chills, fatigue, fever and unexpected weight change.  HENT: Negative for trouble swallowing and voice change.   Eyes: Negative for blurred vision and visual disturbance.  Respiratory: Negative for cough, shortness of breath and wheezing.   Cardiovascular: Negative for chest pain, palpitations and leg swelling.  Gastrointestinal: Negative for diarrhea, nausea  and vomiting.  Endocrine: Negative for cold intolerance, heat intolerance, polydipsia, polyphagia and polyuria.  Musculoskeletal: Negative for arthralgias and myalgias.  Skin: Negative for color change, pallor, rash and wound.  Neurological: Negative for seizures and headaches.  Psychiatric/Behavioral: Negative for confusion and suicidal ideas.    Objective:    Vitals with BMI 10/20/2020 09/17/2020 09/17/2020  Height 5' 2" - -  Weight 159 lbs 13 oz - -  BMI 99.83 - -  Systolic 382 505 397  Diastolic 97 87 88  Pulse 97 82 99    BP (!) 150/97   Pulse 97   Ht 5' 2" (1.575 m)   Wt 159 lb 12.8 oz (72.5 kg)   BMI 29.23 kg/m   Wt Readings from Last 3 Encounters:  10/20/20 159 lb 12.8 oz (72.5 kg)  09/16/20 147 lb (66.7 kg)  06/05/20 147  lb (66.7 kg)     Physical Exam Constitutional:      Appearance: She is well-developed.  HENT:     Head: Normocephalic and atraumatic.  Neck:     Thyroid: No thyromegaly.     Trachea: No tracheal deviation.  Cardiovascular:     Rate and Rhythm: Normal rate and regular rhythm.  Pulmonary:     Effort: Pulmonary effort is normal.  Abdominal:     Tenderness: There is no abdominal tenderness. There is no guarding.  Musculoskeletal:        General: Normal range of motion.     Cervical back: Normal range of motion and neck supple.  Skin:    General: Skin is warm and dry.     Coloration: Skin is not pale.     Findings: No erythema or rash.  Neurological:     Mental Status: She is alert and oriented to person, place, and time.     Cranial Nerves: No cranial nerve deficit.     Coordination: Coordination normal.     Deep Tendon Reflexes: Reflexes are normal and symmetric.  Psychiatric:        Judgment: Judgment normal.      CMP ( most recent) CMP     Component Value Date/Time   NA 136 09/16/2020 2043   K 4.4 09/16/2020 2043   CL 102 09/16/2020 2043   CO2 27 09/16/2020 2043   GLUCOSE 152 (H) 09/16/2020 2043   BUN 17 09/16/2020 2043    BUN 18 10/24/2019 0000   CREATININE 0.86 09/16/2020 2043   CREATININE 0.66 05/19/2020 0750   CALCIUM 8.8 (L) 09/16/2020 2043   PROT 6.1 05/19/2020 0750   AST 29 05/19/2020 0750   ALT 40 (H) 05/19/2020 0750   BILITOT 0.3 05/19/2020 0750   GFRNONAA >60 09/16/2020 2043   GFRNONAA 109 05/19/2020 0750   GFRAA 126 05/19/2020 0750     Diabetic Labs (most recent): Lab Results  Component Value Date   HGBA1C 6.5 (A) 06/05/2020   HGBA1C 9.7 (A) 01/20/2020   HGBA1C 11.4 10/24/2019   Recent Results (from the past 2160 hour(s))  Basic metabolic panel     Status: Abnormal   Collection Time: 09/16/20  8:43 PM  Result Value Ref Range   Sodium 136 135 - 145 mmol/L   Potassium 4.4 3.5 - 5.1 mmol/L   Chloride 102 98 - 111 mmol/L   CO2 27 22 - 32 mmol/L   Glucose, Bld 152 (H) 70 - 99 mg/dL    Comment: Glucose reference range applies only to samples taken after fasting for at least 8 hours.   BUN 17 6 - 20 mg/dL   Creatinine, Ser 0.86 0.44 - 1.00 mg/dL   Calcium 8.8 (L) 8.9 - 10.3 mg/dL   GFR, Estimated >60 >60 mL/min    Comment: (NOTE) Calculated using the CKD-EPI Creatinine Equation (2021)    Anion gap 7 5 - 15    Comment: Performed at [facility], [address] [place], [place] [address]  CBC     Status: Abnormal   Collection Time: 09/16/20  8:43 PM  Result Value Ref Range   WBC 6.0 4.0 - 10.5 K/uL   RBC 3.80 (L) 3.87 - 5.11 MIL/uL   Hemoglobin 12.0 12.0 - 15.0 g/dL   HCT 36.1 36.0 - 46.0 %   MCV 95.0 80.0 - 100.0 fL   MCH 31.6 26.0 - 34.0 pg   MCHC 33.2 30.0 - 36.0 g/dL   RDW 12.5  11.5 - 15.5 %   Platelets 286 150 - 400 K/uL   nRBC 0.0 0.0 - 0.2 %    Comment: Performed at [facility], [address] [place], [place] [address]  Troponin I (High Sensitivity)     Status: None   Collection Time: 09/16/20  8:43 PM  Result Value Ref Range   Troponin I (High Sensitivity) 8 <18 ng/L    Comment: (NOTE) Elevated high sensitivity troponin I (hsTnI) values and significant  changes  across serial measurements may suggest ACS but many other  chronic and acute conditions are known to elevate hsTnI results.  Refer to the "Links" section for chest pain algorithms and additional  guidance. Performed at [facility], [address] [place], [place] [address]   Troponin I (High Sensitivity)     Status: None   Collection Time: 09/16/20 10:42 PM  Result Value Ref Range   Troponin I (High Sensitivity) 7 <18 ng/L    Comment: (NOTE) Elevated high sensitivity troponin I (hsTnI) values and significant  changes across serial measurements may suggest ACS but many other  chronic and acute conditions are known to elevate hsTnI results.  Refer to the "Links" section for chest pain algorithms and additional  guidance. Performed at [facility], [address], [place], [place] [address]   POC urine preg, ED     Status: None   Collection Time: 09/16/20 11:06 PM  Result Value Ref Range   Preg Test, Ur NEGATIVE NEGATIVE    Comment:        THE SENSITIVITY OF THIS METHODOLOGY IS >24 mIU/mL   Resp Panel by RT-PCR (Flu A&B, Covid) Nasopharyngeal Swab     Status: None   Collection Time: 09/16/20 11:09 PM   Specimen: Nasopharyngeal Swab; Nasopharyngeal(NP) swabs in vial transport medium  Result Value Ref Range   SARS Coronavirus 2 by RT PCR NEGATIVE NEGATIVE    Comment: (NOTE) SARS-CoV-2 target nucleic acids are NOT DETECTED.  The SARS-CoV-2 RNA is generally detectable in upper respiratory specimens during the acute phase of infection. The lowest concentration of SARS-CoV-2 viral copies this assay can detect is 138 copies/mL. A negative result does not preclude SARS-Cov-2 infection and should not be used as the sole basis for treatment or other patient management decisions. A negative result may occur with  improper specimen collection/handling, submission of specimen other than nasopharyngeal swab, presence of viral mutation(s) within the areas targeted by this assay, and  inadequate number of viral copies(<138 copies/mL). A negative result must be combined with clinical observations, patient history, and epidemiological information. The expected result is Negative.  Fact Sheet for Patients:  EntrepreneurPulse.com.au  Fact Sheet for Healthcare Providers:  IncredibleEmployment.be  This test is no t yet approved or cleared by the [place] FDA and  has been authorized for detection and/or diagnosis of SARS-CoV-2 by FDA under an Emergency Use Authorization (EUA). This EUA will remain  in effect (meaning this test can be used) for the duration of the COVID-19 declaration under Section 564(b)(1) of the Act, 21 U.S.C.section 360bbb-3(b)(1), unless the authorization is terminated  or revoked sooner.       Influenza A by PCR NEGATIVE NEGATIVE   Influenza B by PCR NEGATIVE NEGATIVE    Comment: (NOTE) The Xpert Xpress SARS-CoV-2/FLU/RSV plus assay is intended as an aid in the diagnosis of influenza from Nasopharyngeal swab specimens and should not be used as a sole basis for treatment. Nasal washings and aspirates are unacceptable for Xpert Xpress SARS-CoV-2/FLU/RSV testing.  Fact Sheet for Patients: EntrepreneurPulse.com.au  Fact Sheet for Healthcare Providers: IncredibleEmployment.be  This test is not yet approved or cleared by the [place] FDA and has been authorized for detection and/or diagnosis of SARS-CoV-2 by FDA under an Emergency Use Authorization (EUA). This EUA will remain in effect (meaning this test can be used) for the duration of the COVID-19 declaration under Section 564(b)(1) of the Act, 21 U.S.C. section 360bbb-3(b)(1), unless the authorization is terminated or revoked.  Performed at [facility], [address] [place], [place] [address]   Urinalysis, Routine w reflex microscopic Urine, Clean Catch     Status: Abnormal   Collection Time: 09/16/20  11:33 PM  Result Value Ref Range   Color, Urine YELLOW YELLOW   APPearance HAZY (A) CLEAR   Specific Gravity, Urine 1.024 1.005 - 1.030   pH 5.0 5.0 - 8.0   Glucose, UA NEGATIVE NEGATIVE mg/dL   Hgb urine dipstick SMALL (A) NEGATIVE   Bilirubin Urine NEGATIVE NEGATIVE   Ketones, ur NEGATIVE NEGATIVE mg/dL   Protein, ur 100 (A) NEGATIVE mg/dL   Nitrite NEGATIVE NEGATIVE   Leukocytes,Ua NEGATIVE NEGATIVE   RBC / HPF 0-5 0 - 5 RBC/hpf   WBC, UA 0-5 0 - 5 WBC/hpf   Bacteria, UA NONE SEEN NONE SEEN   Squamous Epithelial / LPF 6-10 0 - 5   Mucus PRESENT    Hyaline Casts, UA PRESENT     Comment: Performed at [facility], [address] [place], [place] [address]      Assessment & Plan:   1. Uncontrolled type 2 diabetes mellitus with hyperglycemia ([place])  - Anne Wright has currently uncontrolled symptomatic type 2 DM since 45 years of age.  She presents without any logs nor meter.  Her A1c is 8.2% increasing from 6.5%.  She reports that she lost coverage for testing supplies as well as for her Trulicity.  She denies any hypoglycemia.  - Recent labs reviewed. - I had a long discussion with her about the progressive nature of diabetes and the pathology behind its complications. -She did not report gross complications from her diabetes, however, she remains at a high risk for more acute and chronic complications which include CAD, CVA, CKD, retinopathy, and neuropathy. These are all discussed in detail with her.  - I have counseled her on diet  and weight management  by adopting a carbohydrate restricted/protein rich diet. Patient is encouraged to switch to  unprocessed or minimally processed     complex starch and increased protein intake (animal or plant source), fruits, and vegetables. -  she is advised to stick to a routine mealtimes to eat 3 meals  a day and avoid unnecessary snacks ( to snack only to correct hypoglycemia).   - she acknowledges that there is a room for improvement  in her food and drink choices. - Suggestion is made for her to avoid simple carbohydrates  from her diet including Cakes, Sweet Desserts, Ice Cream, Soda (diet and regular), Sweet Tea, Candies, Chips, Cookies, Store Bought Juices, Alcohol in Excess of  1-2 drinks a day, Artificial Sweeteners,  Coffee Creamer, and "Sugar-free" Products, Lemonade. This will help patient to have more stable blood glucose profile and potentially avoid unintended weight gain.   - she has been scheduled with Jearld Fenton, RDN, CDE for diabetes education.  - I have approached her with the following individualized plan to manage  her diabetes and patient agrees:   -Based on her presentation with higher A1c  of 8.2%, she is approached for higher dose of basal insulin.  I discussed and increase her Lantus to 30 units nightly, associated with monitoring of blood glucose at least twice a day-daily before breakfast and at bedtime   -A new set of testing supplies was prescribed for her.   - she is encouraged to call clinic for blood glucose levels less than 70 or above 300 mg /dl. - she is not affording co-pay for Trulicity, will be discontinued.  I discussed and added glipizide 5 mg XL p.o. daily at breakfast.  -She does not tolerate Metformin.  - Specific targets for  A1c;  LDL, HDL,  and Triglycerides were discussed with the patient.  2) Blood Pressure /Hypertension: Her blood pressure is not controlled to target.  I discussed and increased her lisinopril to 20 mg p.o. daily at breakfast.  She also has urine microalbuminuria.     3) Lipids/Hyperlipidemia:   Review of her recent lipid panel showed improved LDL toasts 60 from 151.  She is advised to continue atorvastatin, increase to 20 mg nightly.  Side effects and precautions discussed with her.   4)  Weight/Diet:  Body mass index is 29.23 kg/m.   she is not a candidate for weight loss. Exercise, and detailed carbohydrates information provided  -  detailed on discharge  instructions.  5) Chronic Care/Health Maintenance:  -she  is on Statin medications and  is encouraged to initiate and continue to follow up with Ophthalmology, Dentist,  Podiatrist at least yearly or according to recommendations, and advised to   stay away from smoking. I have recommended yearly flu vaccine and pneumonia vaccine at least every 5 years; moderate intensity exercise for up to 150 minutes weekly; and  sleep for at least 7 hours a day.  - she is  advised to maintain close follow up with Dianna Rossetti, NP (Inactive) for primary care needs, as well as her other providers for optimal and coordinated care.  - Time spent on this patient care encounter:  40 min, of which > 50% was spent in  counseling and the rest reviewing her blood glucose logs , discussing her hypoglycemia and hyperglycemia episodes, reviewing her current and  previous labs / studies  ( including abstraction from other facilities) and medications  doses and developing a  long term treatment plan and documenting her care.   Please refer to Patient Instructions for Blood Glucose Monitoring and Insulin/Medications Dosing Guide"  in media tab for additional information. Please  also refer to " Patient Self Inventory" in the Media  tab for reviewed elements of pertinent patient history.  Anne Wright participated in the discussions, expressed understanding, and voiced agreement with the above plans.  All questions were answered to her satisfaction. she is encouraged to contact clinic should she have any questions or concerns prior to her return visit.    Follow up plan: - Return in about 3 months (around 01/20/2021) for Bring Meter and Logs- A1c in Office.  Glade Lloyd, MD [facility] [address] [place], [place] [address] Phone: [phone]  Fax: [phone]    10/20/2020, 9:26 AM  This note was partially dictated with voice recognition software. Similar  sounding words can be transcribed inadequately or may not  be corrected upon review.

## 2020-11-21 ENCOUNTER — Other Ambulatory Visit: Payer: Self-pay | Admitting: "Endocrinology

## 2021-01-20 ENCOUNTER — Encounter: Payer: Self-pay | Admitting: "Endocrinology

## 2021-01-20 ENCOUNTER — Other Ambulatory Visit: Payer: Self-pay

## 2021-01-20 ENCOUNTER — Ambulatory Visit (INDEPENDENT_AMBULATORY_CARE_PROVIDER_SITE_OTHER): Payer: Commercial Managed Care - PPO | Admitting: "Endocrinology

## 2021-01-20 VITALS — BP 110/76 | HR 92 | Ht 62.0 in | Wt 164.8 lb

## 2021-01-20 DIAGNOSIS — I10 Essential (primary) hypertension: Secondary | ICD-10-CM

## 2021-01-20 DIAGNOSIS — E1165 Type 2 diabetes mellitus with hyperglycemia: Secondary | ICD-10-CM | POA: Diagnosis not present

## 2021-01-20 DIAGNOSIS — E782 Mixed hyperlipidemia: Secondary | ICD-10-CM

## 2021-01-20 LAB — POCT GLYCOSYLATED HEMOGLOBIN (HGB A1C): HbA1c, POC (controlled diabetic range): 8.3 % — AB (ref 0.0–7.0)

## 2021-01-20 MED ORDER — LANTUS SOLOSTAR 100 UNIT/ML ~~LOC~~ SOPN: 24.0000 [IU] | PEN_INJECTOR | Freq: Every day | SUBCUTANEOUS | 0 refills | Status: DC

## 2021-01-20 NOTE — Progress Notes (Signed)
01/20/2021, 12:11 PM  Endocrinology follow-up note  Subjective:    Patient ID: Anne Wright, female    DOB: [birth year].  Anne Wright is being seen in follow-up after she was seen in consultation for management of currently uncontrolled symptomatic diabetes requested by  Dianna Rossetti, NP (Inactive).   Past Medical History:  Diagnosis Date   Diabetes mellitus without complication (HCC)    High cholesterol    Hypertension     History reviewed. No pertinent surgical history.  Social History   Socioeconomic History   Marital status: Single    Spouse name: Not on file   Number of children: Not on file   Years of education: Not on file   Highest education level: Not on file  Occupational History   Not on file  Tobacco Use   Smoking status: Former    Pack years: 0.00    Types: Cigarettes    Quit date: 06/29/2018    Years since quitting: 2.5   Smokeless tobacco: Never  Vaping Use   Vaping Use: Never used  Substance and Sexual Activity   Alcohol use: Yes   Drug use: No   Sexual activity: Not on file  Other Topics Concern   Not on file  Social History Narrative   Not on file   Social Determinants of Health   Financial Resource Strain: Not on file  Food Insecurity: Not on file  Transportation Needs: Not on file  Physical Activity: Not on file  Stress: Not on file  Social Connections: Not on file    Family History  Problem Relation Age of Onset   Diabetes Mother    Hypertension Mother    Asthma Son    Breast cancer Maternal Grandmother     Outpatient Encounter Medications as of 01/20/2021  Medication Sig   aspirin EC 81 MG tablet Take 81 mg by mouth daily.   atorvastatin (LIPITOR) 20 MG tablet Take 1 tablet (20 mg total) by mouth at bedtime.   Blood Glucose Monitoring Suppl (ACCU-CHEK GUIDE ME) w/Device KIT 1 Piece by Does not apply route as directed.   busPIRone  (BUSPAR) 5 MG tablet buspirone 5 mg tablet (Patient not taking: Reported on 01/20/2021)   glipiZIDE (GLUCOTROL XL) 5 MG 24 hr tablet Take 1 tablet (5 mg total) by mouth daily with breakfast.   glucose blood (ACCU-CHEK GUIDE) test strip Use as instructed   insulin glargine (LANTUS SOLOSTAR) 100 UNIT/ML Solostar Pen Inject 24 Units into the skin at bedtime.   Insulin Pen Needle (BD PEN NEEDLE NANO U/F) 32G X 4 MM MISC Once daily   Insulin Pen Needle (BD PEN NEEDLE NANO U/F) 32G X 4 MM MISC 1 each by Does not apply route 4 (four) times daily.   Lancets (FREESTYLE) lancets Test blod glucose four times daily. Use as instructed   lisinopril (ZESTRIL) 20 MG tablet Take 1 tablet (20 mg total) by mouth daily.   ondansetron (ZOFRAN ODT) 8 MG disintegrating tablet 8mg  ODT q4 hours prn nausea   Vitamin D, Ergocalciferol, (DRISDOL) 1.25 MG (50000 UNIT) CAPS capsule Take 1 capsule (50,000  Units total) by mouth every 7 (seven) days. (Patient not taking: Reported on 01/20/2021)   [DISCONTINUED] insulin glargine (LANTUS SOLOSTAR) 100 UNIT/ML Solostar Pen Inject 30 Units into the skin at bedtime. (Patient taking differently: Inject 20 Units into the skin at bedtime.)   No facility-administered encounter medications on file as of 01/20/2021.    ALLERGIES: Allergies  Allergen Reactions   Shellfish Allergy     VACCINATION STATUS: Immunization History  Administered Date(s) Administered   Tdap 04/10/2015    Diabetes She presents for her follow-up diabetic visit. She has type 2 diabetes mellitus. Onset time: She was diagnosed at approximate age of 60 years.  She did have a gestational diabetes during her first pregnancy 19 years ago. Her disease course has been worsening. There are no hypoglycemic associated symptoms. Pertinent negatives for hypoglycemia include no confusion, headaches, pallor or seizures. Pertinent negatives for diabetes include no blurred vision, no chest pain, no fatigue, no polydipsia, no  polyphagia and no polyuria. There are no hypoglycemic complications. Symptoms are worsening. There are no diabetic complications. Risk factors for coronary artery disease include diabetes mellitus and dyslipidemia. Current diabetic treatments: She is not tolerating her Metformin.  Currently taking Trulicity 1.82 mg subcutaneously weekly. Her weight is increasing steadily. She is following a generally unhealthy diet. When asked about meal planning, she reported none. She has not had a previous visit with a dietitian. She rarely participates in exercise. Her home blood glucose trend is decreasing steadily. Her breakfast blood glucose range is generally 90-110 mg/dl. Her overall blood glucose range is 90-110 mg/dl. (She presents with tight glucose control at fasting averaging 77 for the last 7 days, 102 for the last 90 days.  Her point-of-care A1c is 8.3% unchanged from her last visit A1c and overall increasing from 6.5%.  She did have hypoglycemia in the 60s randomly in the morning.    ) An ACE inhibitor/angiotensin II receptor blocker is not being taken. Eye exam is not current.  Hyperlipidemia This is a chronic problem. The current episode started more than 1 year ago. The problem is uncontrolled. Exacerbating diseases include diabetes. Pertinent negatives include no chest pain, myalgias or shortness of breath. Current antihyperlipidemic treatment includes statins. Risk factors for coronary artery disease include diabetes mellitus, dyslipidemia and family history.   Review of Systems  Constitutional:  Negative for chills, fatigue, fever and unexpected weight change.  HENT:  Negative for trouble swallowing and voice change.   Eyes:  Negative for blurred vision and visual disturbance.  Respiratory:  Negative for cough, shortness of breath and wheezing.   Cardiovascular:  Negative for chest pain, palpitations and leg swelling.  Gastrointestinal:  Negative for diarrhea, nausea and vomiting.  Endocrine:  Negative for cold intolerance, heat intolerance, polydipsia, polyphagia and polyuria.  Musculoskeletal:  Negative for arthralgias and myalgias.  Skin:  Negative for color change, pallor, rash and wound.  Neurological:  Negative for seizures and headaches.  Psychiatric/Behavioral:  Negative for confusion and suicidal ideas.    Objective:    Vitals with BMI 01/20/2021 10/20/2020 09/17/2020  Height $Remov'5\' 2"'ZvFpHT$  $Remove'5\' 2"'DizoJqO$  -  Weight 164 lbs 13 oz 159 lbs 13 oz -  BMI 99.37 16.96 -  Systolic 789 381 017  Diastolic 76 97 87  Pulse 92 97 82    BP 110/76   Pulse 92   Ht $R'5\' 2"'CI$  (1.575 m)   Wt 164 lb 12.8 oz (74.8 kg)   BMI 30.14 kg/m   Wt Readings from Last 3 Encounters:  01/20/21 164 lb 12.8 oz (74.8 kg)  10/20/20 159 lb 12.8 oz (72.5 kg)  09/16/20 147 lb (66.7 kg)     Physical Exam Constitutional:      Appearance: She is well-developed.  HENT:     Head: Normocephalic and atraumatic.  Neck:     Thyroid: No thyromegaly.     Trachea: No tracheal deviation.  Cardiovascular:     Rate and Rhythm: Normal rate and regular rhythm.  Pulmonary:     Effort: Pulmonary effort is normal.  Abdominal:     Tenderness: There is no abdominal tenderness. There is no guarding.  Musculoskeletal:        General: Normal range of motion.     Cervical back: Normal range of motion and neck supple.  Skin:    General: Skin is warm and dry.     Coloration: Skin is not pale.     Findings: No erythema or rash.  Neurological:     Mental Status: She is alert and oriented to person, place, and time.     Cranial Nerves: No cranial nerve deficit.     Coordination: Coordination normal.     Deep Tendon Reflexes: Reflexes are normal and symmetric.  Psychiatric:        Judgment: Judgment normal.     CMP ( most recent) CMP     Component Value Date/Time   NA 136 09/16/2020 2043   K 4.4 09/16/2020 2043   CL 102 09/16/2020 2043   CO2 27 09/16/2020 2043   GLUCOSE 152 (H) 09/16/2020 2043   BUN 17 09/16/2020 2043   BUN  18 10/24/2019 0000   CREATININE 0.86 09/16/2020 2043   CREATININE 0.66 05/19/2020 0750   CALCIUM 8.8 (L) 09/16/2020 2043   PROT 6.1 05/19/2020 0750   AST 29 05/19/2020 0750   ALT 40 (H) 05/19/2020 0750   BILITOT 0.3 05/19/2020 0750   GFRNONAA >60 09/16/2020 2043   GFRNONAA 109 05/19/2020 0750   GFRAA 126 05/19/2020 0750     Diabetic Labs (most recent): Lab Results  Component Value Date   HGBA1C 8.3 (A) 01/20/2021   HGBA1C 8.2 (A) 10/20/2020   HGBA1C 6.5 (A) 06/05/2020   Recent Results (from the past 2160 hour(s))  HgB A1c     Status: Abnormal   Collection Time: 01/20/21  9:07 AM  Result Value Ref Range   Hemoglobin A1C     HbA1c POC (<> result, manual entry)     HbA1c, POC (prediabetic range)     HbA1c, POC (controlled diabetic range) 8.3 (A) 0.0 - 7.0 %      Assessment & Plan:   1. Uncontrolled type 2 diabetes mellitus with hyperglycemia ([place])  - Anne Wright has currently uncontrolled symptomatic type 2 DM since 45 years of age.  She presents with tight glucose control at fasting averaging 77 for the last 7 days, 102 for the last 90 days.  Her point-of-care A1c is 8.3% unchanged from her last visit A1c and overall increasing from 6.5%.  She did have hypoglycemia in the 60s randomly in the morning.    - Recent labs reviewed. - I had a long discussion with her about the progressive nature of diabetes and the pathology behind its complications. -She did not report gross complications from her diabetes, however, she remains at a high risk for more acute and chronic complications which include CAD, CVA, CKD, retinopathy, and neuropathy. These are all discussed in detail with her.  - I have counseled her on  diet  and weight management  by adopting a carbohydrate restricted/protein rich diet. Patient is encouraged to switch to  unprocessed or minimally processed     complex starch and increased protein intake (animal or plant source), fruits, and vegetables. -  she is  advised to stick to a routine mealtimes to eat 3 meals  a day and avoid unnecessary snacks ( to snack only to correct hypoglycemia).  - she acknowledges that there is a room for improvement in her food and drink choices. - Suggestion is made for her to avoid simple carbohydrates  from her diet including Cakes, Sweet Desserts, Ice Cream, Soda (diet and regular), Sweet Tea, Candies, Chips, Cookies, Store Bought Juices, Alcohol in Excess of  1-2 drinks a day, Artificial Sweeteners,  Coffee Creamer, and "Sugar-free" Products, Lemonade. This will help patient to have more stable blood glucose profile and potentially avoid unintended weight gain.   - she has been scheduled with Jearld Fenton, RDN, CDE for diabetes education.  - I have approached her with the following individualized plan to manage  her diabetes and patient agrees:   -Based on her presentation with tightly controlled fasting glycemic profile, she would not tolerate a higher dose of basal insulin.  I advised her to lower her Lantus to 24 units nightly, and advised her to start monitoring blood glucose 4 times a day for 10 to 15 days and present her logs.    After, she can monitor blood glucose twice daily before breakfast and at bedtime.     - she is encouraged to call clinic for blood glucose levels less than 70 or above 200 mg /dl. - she is not affording co-pay for Trulicity, will be discontinued.  -She is advised to continue glipizide 5 mg XL p.o. daily at breakfast.   -She does not tolerate Metformin.  - Specific targets for  A1c;  LDL, HDL,  and Triglycerides were discussed with the patient.  2) Blood Pressure /Hypertension: Her blood pressure is controlled to target.  -She is advised to continue lisinopril 20 mg p.o. daily  at breakfast.  She also has urine microalbuminuria.     3) Lipids/Hyperlipidemia:   Review of her recent lipid panel showed improved LDL improving to 60 from 151.  She is advised to continue atorvastatin  20 mg nightly.  Side effects and precautions discussed with her.   4)  Weight/Diet:  Body mass index is 30.14 kg/m.   she is is a candidate for modest weight loss.   Exercise, and detailed carbohydrates information provided  -  detailed on discharge instructions.  5) Chronic Care/Health Maintenance:  -she  is on Statin medications and  is encouraged to initiate and continue to follow up with Ophthalmology, Dentist,  Podiatrist at least yearly or according to recommendations, and advised to   stay away from smoking. I have recommended yearly flu vaccine and pneumonia vaccine at least every 5 years; moderate intensity exercise for up to 150 minutes weekly; and  sleep for at least 7 hours a day.  - she is  advised to maintain close follow up with Dianna Rossetti, NP (Inactive) for primary care needs, as well as her other providers for optimal and coordinated care.    I spent 41 minutes in the care of the patient today including review of labs from [facility], Lipids, Thyroid Function, Hematology (current and previous including abstractions from other facilities); face-to-face time discussing  her blood glucose readings/logs, discussing hypoglycemia and hyperglycemia episodes and symptoms, medications  doses, her options of short and long term treatment based on the latest standards of care / guidelines;  discussion about incorporating lifestyle medicine;  and documenting the encounter.    Please refer to Patient Instructions for Blood Glucose Monitoring and Insulin/Medications Dosing Guide"  in media tab for additional information. Please  also refer to " Patient Self Inventory" in the Media  tab for reviewed elements of pertinent patient history.  Anne Wright participated in the discussions, expressed understanding, and voiced agreement with the above plans.  All questions were answered to her satisfaction. she is encouraged to contact clinic should she have any questions or concerns prior to her return  visit.   Follow up plan: - Return in about 3 months (around 04/22/2021) for F/U with Pre-visit Labs, Meter, Logs, A1c here.Glade Lloyd, MD [facility] [address] [place], [place] [address] Phone: [phone]  Fax: [phone]    01/20/2021, 12:11 PM  This note was partially dictated with voice recognition software. Similar sounding words can be transcribed inadequately or may not  be corrected upon review.

## 2021-01-20 NOTE — Patient Instructions (Signed)

## 2021-02-03 ENCOUNTER — Telehealth: Payer: Self-pay | Admitting: Nurse Practitioner

## 2021-02-03 NOTE — Telephone Encounter (Signed)
This was faxed back to the facility with the new Lantus dosage.

## 2021-02-19 ENCOUNTER — Other Ambulatory Visit: Payer: Self-pay | Admitting: "Endocrinology

## 2021-04-02 ENCOUNTER — Other Ambulatory Visit: Payer: Self-pay | Admitting: "Endocrinology

## 2021-04-22 ENCOUNTER — Ambulatory Visit: Payer: Commercial Managed Care - PPO | Admitting: "Endocrinology

## 2021-04-22 LAB — TSH: TSH: 1.59 u[IU]/mL (ref 0.450–4.500)

## 2021-04-22 LAB — COMPREHENSIVE METABOLIC PANEL
ALT: 73 IU/L — ABNORMAL HIGH (ref 0–32)
AST: 75 IU/L — ABNORMAL HIGH (ref 0–40)
Albumin/Globulin Ratio: 1.7 (ref 1.2–2.2)
Albumin: 3.8 g/dL (ref 3.8–4.8)
Alkaline Phosphatase: 69 IU/L (ref 44–121)
BUN/Creatinine Ratio: 17 (ref 9–23)
BUN: 15 mg/dL (ref 6–24)
Bilirubin Total: 0.2 mg/dL (ref 0.0–1.2)
CO2: 20 mmol/L (ref 20–29)
Calcium: 9.2 mg/dL (ref 8.7–10.2)
Chloride: 107 mmol/L — ABNORMAL HIGH (ref 96–106)
Creatinine, Ser: 0.86 mg/dL (ref 0.57–1.00)
Globulin, Total: 2.2 g/dL (ref 1.5–4.5)
Glucose: 173 mg/dL — ABNORMAL HIGH (ref 65–99)
Potassium: 4.9 mmol/L (ref 3.5–5.2)
Sodium: 141 mmol/L (ref 134–144)
Total Protein: 6 g/dL (ref 6.0–8.5)
eGFR: 86 mL/min/{1.73_m2} (ref >59)

## 2021-04-22 LAB — LIPID PANEL
Chol/HDL Ratio: 2.5 ratio (ref 0.0–4.4)
Cholesterol, Total: 131 mg/dL (ref 100–199)
HDL: 52 mg/dL (ref >39)
LDL Chol Calc (NIH): 62 mg/dL (ref 0–99)
Triglycerides: 91 mg/dL (ref 0–149)
VLDL Cholesterol Cal: 17 mg/dL (ref 5–40)

## 2021-04-22 LAB — T4, FREE: Free T4: 1.13 ng/dL (ref 0.82–1.77)

## 2021-04-27 ENCOUNTER — Ambulatory Visit (INDEPENDENT_AMBULATORY_CARE_PROVIDER_SITE_OTHER): Payer: Commercial Managed Care - PPO | Admitting: "Endocrinology

## 2021-04-27 ENCOUNTER — Other Ambulatory Visit: Payer: Self-pay

## 2021-04-27 ENCOUNTER — Encounter: Payer: Self-pay | Admitting: "Endocrinology

## 2021-04-27 VITALS — BP 118/76 | HR 100 | Ht 62.0 in | Wt 163.4 lb

## 2021-04-27 DIAGNOSIS — E559 Vitamin D deficiency, unspecified: Secondary | ICD-10-CM

## 2021-04-27 DIAGNOSIS — E782 Mixed hyperlipidemia: Secondary | ICD-10-CM | POA: Diagnosis not present

## 2021-04-27 DIAGNOSIS — E1165 Type 2 diabetes mellitus with hyperglycemia: Secondary | ICD-10-CM | POA: Diagnosis not present

## 2021-04-27 DIAGNOSIS — I10 Essential (primary) hypertension: Secondary | ICD-10-CM | POA: Diagnosis not present

## 2021-04-27 LAB — POCT GLYCOSYLATED HEMOGLOBIN (HGB A1C): HbA1c, POC (controlled diabetic range): 7.9 % — AB (ref 0.0–7.0)

## 2021-04-27 MED ORDER — BD PEN NEEDLE NANO U/F 32G X 4 MM MISC: 3 refills | Status: DC

## 2021-04-27 NOTE — Patient Instructions (Signed)

## 2021-04-27 NOTE — Progress Notes (Signed)
04/27/2021, 11:16 AM  Endocrinology follow-up note  Subjective:    Patient ID: Anne Wright, female    DOB: March 08, 1976.  Anne Wright is being seen in follow-up after she was seen in consultation for management of currently uncontrolled symptomatic diabetes requested by  Dianna Rossetti, NP (Inactive).   Past Medical History:  Diagnosis Date   Diabetes mellitus without complication ([place])    High cholesterol    Hypertension     No past surgical history on file.  Social History   Socioeconomic History   Marital status: Single    Spouse name: Not on file   Number of children: Not on file   Years of education: Not on file   Highest education level: Not on file  Occupational History   Not on file  Tobacco Use   Smoking status: Former    Types: Cigarettes    Quit date: 06/29/2018    Years since quitting: 2.8   Smokeless tobacco: Never  Vaping Use   Vaping Use: Never used  Substance and Sexual Activity   Alcohol use: Yes   Drug use: No   Sexual activity: Not on file  Other Topics Concern   Not on file  Social History Narrative   Not on file   Social Determinants of Health   Financial Resource Strain: Not on file  Food Insecurity: Not on file  Transportation Needs: Not on file  Physical Activity: Not on file  Stress: Not on file  Social Connections: Not on file    Family History  Problem Relation Age of Onset   Diabetes Mother    Hypertension Mother    Asthma Son    Breast cancer Maternal Grandmother     Outpatient Encounter Medications as of 04/27/2021  Medication Sig   aspirin EC 81 MG tablet Take 81 mg by mouth daily.   atorvastatin (LIPITOR) 20 MG tablet TAKE 1 TABLET(20 MG) BY MOUTH AT BEDTIME   Blood Glucose Monitoring Suppl (ACCU-CHEK GUIDE ME) w/Device KIT 1 Piece by Does not apply route as directed.   busPIRone (BUSPAR) 5 MG tablet buspirone 5 mg tablet  (Patient not taking: Reported on 01/20/2021)   glipiZIDE (GLUCOTROL XL) 5 MG 24 hr tablet TAKE 1 TABLET(5 MG) BY MOUTH DAILY WITH BREAKFAST   glucose blood (ACCU-CHEK GUIDE) test strip Use as instructed   insulin glargine (LANTUS SOLOSTAR) 100 UNIT/ML Solostar Pen Inject 24 Units into the skin at bedtime.   Insulin Pen Needle (BD PEN NEEDLE NANO U/F) 32G X 4 MM MISC Once daily   Lancets (FREESTYLE) lancets Test blod glucose four times daily. Use as instructed   lisinopril (ZESTRIL) 20 MG tablet TAKE 1 TABLET(20 MG) BY MOUTH DAILY   ondansetron (ZOFRAN ODT) 8 MG disintegrating tablet 79m ODT q4 hours prn nausea   [DISCONTINUED] Insulin Pen Needle (BD PEN NEEDLE NANO U/F) 32G X 4 MM MISC Once daily   [DISCONTINUED] Insulin Pen Needle (BD PEN NEEDLE NANO U/F) 32G X 4 MM MISC 1 each by Does not apply route 4 (four) times daily.   [DISCONTINUED] Vitamin D, Ergocalciferol, (DRISDOL) 1.25 MG (  50000 UNIT) CAPS capsule Take 1 capsule (50,000 Units total) by mouth every 7 (seven) days. (Patient not taking: Reported on 01/20/2021)   No facility-administered encounter medications on file as of 04/27/2021.    ALLERGIES: Allergies  Allergen Reactions   Shellfish Allergy     VACCINATION STATUS: Immunization History  Administered Date(s) Administered   Tdap 04/10/2015    Diabetes She presents for her follow-up diabetic visit. She has type 2 diabetes mellitus. Onset time: She was diagnosed at approximate age of 69 years.  She did have a gestational diabetes during her first pregnancy 19 years ago. Her disease course has been improving. There are no hypoglycemic associated symptoms. Pertinent negatives for hypoglycemia include no confusion, headaches, pallor or seizures. Pertinent negatives for diabetes include no blurred vision, no chest pain, no fatigue, no polydipsia, no polyphagia and no polyuria. There are no hypoglycemic complications. Symptoms are improving. There are no diabetic complications. Risk  factors for coronary artery disease include diabetes mellitus and dyslipidemia. Current diabetic treatments: She is not tolerating her Metformin.  Currently taking Trulicity 8.87 mg subcutaneously weekly. Her weight is fluctuating minimally. She is following a generally unhealthy diet. When asked about meal planning, she reported none. She has not had a previous visit with a dietitian. She rarely participates in exercise. Her breakfast blood glucose range is generally 180-200 mg/dl. Her overall blood glucose range is 180-200 mg/dl. (She did not present with her meter and logs.  She reports improvement in her glycemic profile and point-of-care A1c is 7.9% improving from 8.3% during her last visit.  Patient denies hypoglycemia. ) An ACE inhibitor/angiotensin II receptor blocker is not being taken. Eye exam is not current.  Hyperlipidemia This is a chronic problem. The current episode started more than 1 year ago. The problem is uncontrolled. Exacerbating diseases include diabetes. Pertinent negatives include no chest pain, myalgias or shortness of breath. Current antihyperlipidemic treatment includes statins. Risk factors for coronary artery disease include diabetes mellitus, dyslipidemia and family history.   Review of Systems  Constitutional:  Negative for chills, fatigue, fever and unexpected weight change.  HENT:  Negative for trouble swallowing and voice change.   Eyes:  Negative for blurred vision and visual disturbance.  Respiratory:  Negative for cough, shortness of breath and wheezing.   Cardiovascular:  Negative for chest pain, palpitations and leg swelling.  Gastrointestinal:  Negative for diarrhea, nausea and vomiting.  Endocrine: Negative for cold intolerance, heat intolerance, polydipsia, polyphagia and polyuria.  Musculoskeletal:  Negative for arthralgias and myalgias.  Skin:  Negative for color change, pallor, rash and wound.  Neurological:  Negative for seizures and headaches.   Psychiatric/Behavioral:  Negative for confusion and suicidal ideas.    Objective:    Vitals with BMI 04/27/2021 01/20/2021 10/20/2020  Height _0  _1  _2   Weight 163 lbs 6 oz 164 lbs 13 oz 159 lbs 13 oz  BMI 29.88 57.97 28.20  Systolic 601 561 537  Diastolic 76 76 97  Pulse 943 92 97    BP 118/76   Pulse 100   Ht _3  (1.575 m)   Wt 163 lb 6.4 oz (74.1 kg)   BMI 29.89 kg/m   Wt Readings from Last 3 Encounters:  04/27/21 163 lb 6.4 oz (74.1 kg)  01/20/21 164 lb 12.8 oz (74.8 kg)  10/20/20 159 lb 12.8 oz (72.5 kg)     Physical Exam Constitutional:      Appearance: She is well-developed.  HENT:  Head: Normocephalic and atraumatic.  Neck:     Thyroid: No thyromegaly.     Trachea: No tracheal deviation.  Cardiovascular:     Rate and Rhythm: Normal rate and regular rhythm.  Pulmonary:     Effort: Pulmonary effort is normal.  Abdominal:     Tenderness: There is no abdominal tenderness. There is no guarding.  Musculoskeletal:        General: Normal range of motion.     Cervical back: Normal range of motion and neck supple.  Skin:    General: Skin is warm and dry.     Coloration: Skin is not pale.     Findings: No erythema or rash.  Neurological:     Mental Status: She is alert and oriented to person, place, and time.     Cranial Nerves: No cranial nerve deficit.     Coordination: Coordination normal.     Deep Tendon Reflexes: Reflexes are normal and symmetric.  Psychiatric:        Judgment: Judgment normal.     CMP ( most recent) CMP     Component Value Date/Time   NA 141 04/21/2021 1519   K 4.9 04/21/2021 1519   CL 107 (H) 04/21/2021 1519   CO2 20 04/21/2021 1519   GLUCOSE 173 (H) 04/21/2021 1519   GLUCOSE 152 (H) 09/16/2020 2043   BUN 15 04/21/2021 1519   CREATININE 0.86 04/21/2021 1519   CREATININE 0.66 05/19/2020 0750   CALCIUM 9.2 04/21/2021 1519   PROT 6.0 04/21/2021 1519   ALBUMIN 3.8 04/21/2021 1519   AST 75 (H) 04/21/2021 1519   ALT 73  (H) 04/21/2021 1519   ALKPHOS 69 04/21/2021 1519   BILITOT 0.2 04/21/2021 1519   GFRNONAA >60 09/16/2020 2043   GFRNONAA 109 05/19/2020 0750   GFRAA 126 05/19/2020 0750     Diabetic Labs (most recent): Lab Results  Component Value Date   HGBA1C 7.9 (A) 04/27/2021   HGBA1C 8.3 (A) 01/20/2021   HGBA1C 8.2 (A) 10/20/2020   Recent Results (from the past 2160 hour(s))  Comprehensive metabolic panel     Status: Abnormal   Collection Time: 04/21/21  3:19 PM  Result Value Ref Range   Glucose 173 (H) 65 - 99 mg/dL    Comment:                **Effective April 26, 2021 Glucose reference**                  interval will be changing to:                                                             70 - 99    BUN 15 6 - 24 mg/dL   Creatinine, Ser 0.86 0.57 - 1.00 mg/dL   eGFR 86 >59 mL/min/1.73   BUN/Creatinine Ratio 17 9 - 23   Sodium 141 134 - 144 mmol/L   Potassium 4.9 3.5 - 5.2 mmol/L   Chloride 107 (H) 96 - 106 mmol/L   CO2 20 20 - 29 mmol/L   Calcium 9.2 8.7 - 10.2 mg/dL   Total Protein 6.0 6.0 - 8.5 g/dL   Albumin 3.8 3.8 - 4.8 g/dL   Globulin, Total 2.2 1.5 - 4.5 g/dL   Albumin/Globulin Ratio 1.7  1.2 - 2.2   Bilirubin Total 0.2 0.0 - 1.2 mg/dL   Alkaline Phosphatase 69 44 - 121 IU/L   AST 75 (H) 0 - 40 IU/L   ALT 73 (H) 0 - 32 IU/L  TSH     Status: None   Collection Time: 04/21/21  3:19 PM  Result Value Ref Range   TSH 1.590 0.450 - 4.500 uIU/mL  T4, free     Status: None   Collection Time: 04/21/21  3:19 PM  Result Value Ref Range   Free T4 1.13 0.82 - 1.77 ng/dL  Lipid panel     Status: None   Collection Time: 04/21/21  3:19 PM  Result Value Ref Range   Cholesterol, Total 131 100 - 199 mg/dL   Triglycerides 91 0 - 149 mg/dL   HDL 52 >39 mg/dL   VLDL Cholesterol Cal 17 5 - 40 mg/dL   LDL Chol Calc (NIH) 62 0 - 99 mg/dL   Chol/HDL Ratio 2.5 0.0 - 4.4 ratio    Comment:                                   T. Chol/HDL Ratio                                              Men  Women                               1/2 Avg.Risk  3.4    3.3                                   Avg.Risk  5.0    4.4                                2X Avg.Risk  9.6    7.1                                3X Avg.Risk 23.4   11.0   HgB A1c     Status: Abnormal   Collection Time: 04/27/21  9:51 AM  Result Value Ref Range   Hemoglobin A1C     HbA1c POC (<> result, manual entry)     HbA1c, POC (prediabetic range)     HbA1c, POC (controlled diabetic range) 7.9 (A) 0.0 - 7.0 %      Assessment & Plan:   1. Uncontrolled type 2 diabetes mellitus with hyperglycemia ([place])  - Anne Wright has currently uncontrolled symptomatic type 2 DM since 45 years of age.  She did not present with her meter and logs.  She reports improvement in her glycemic profile and point-of-care A1c is 7.9% improving from 8.3% during her last visit.  Patient denies hypoglycemia.  - Recent labs reviewed. - I had a long discussion with her about the progressive nature of diabetes and the pathology behind its complications. -She did not report gross complications from her diabetes, however, she remains at a high risk for more acute and chronic complications which include CAD, CVA, CKD, retinopathy,  and neuropathy. These are all discussed in detail with her.  - I have counseled her on diet  and weight management  by adopting a carbohydrate restricted/protein rich diet. Patient is encouraged to switch to  unprocessed or minimally processed     complex starch and increased protein intake (animal or plant source), fruits, and vegetables. -  she is advised to stick to a routine mealtimes to eat 3 meals  a day and avoid unnecessary snacks ( to snack only to correct hypoglycemia).   - she acknowledges that there is a room for improvement in her food and drink choices. - Suggestion is made for her to avoid simple carbohydrates  from her diet including Cakes, Sweet Desserts, Ice Cream, Soda (diet and regular), Sweet Tea,  Candies, Chips, Cookies, Store Bought Juices, Alcohol in Excess of  1-2 drinks a day, Artificial Sweeteners,  Coffee Creamer, and "Sugar-free" Products, Lemonade. This will help patient to have more stable blood glucose profile and potentially avoid unintended weight gain.   - she has been scheduled with Jearld Fenton, RDN, CDE for diabetes education.  - I have approached her with the following individualized plan to manage  her diabetes and patient agrees:   -She is advised to drop off her logs/meter for review.  In the meantime, she is advised to continue Lantus 24 units nightly, advised her to continue monitoring blood glucose at least twice a day-daily before breakfast and at bedtime    - she is encouraged to call clinic for blood glucose levels less than 70 or above 200 mg /dl. -She is advised to continue glipizide 5 mg XL p.o. daily at breakfast.  She did not tolerate metformin, did not afford Trulicity.  - Specific targets for  A1c;  LDL, HDL,  and Triglycerides were discussed with the patient.  2) Blood Pressure /Hypertension:  Her blood pressure is controlled to target. -She is advised to continue lisinopril 20 mg p.o. daily  at breakfast.  She also has urine microalbuminuria.     3) Lipids/Hyperlipidemia:   Review of her recent lipid panel showed improved LDL improving to 60 from 151.  She is advised to continue atorvastatin 20 mg p.o. nightly.  Side effects and precautions discussed with her.   4)  Weight/Diet:  Body mass index is 29.89 kg/m.   she is is a candidate for modest weight loss.   Exercise, and detailed carbohydrates information provided  -  detailed on discharge instructions.  5) Chronic Care/Health Maintenance:  -she  is on Statin medications and  is encouraged to initiate and continue to follow up with Ophthalmology, Dentist,  Podiatrist at least yearly or according to recommendations, and advised to   stay away from smoking. I have recommended yearly flu  vaccine and pneumonia vaccine at least every 5 years; moderate intensity exercise for up to 150 minutes weekly; and  sleep for at least 7 hours a day.  - she is  advised to maintain close follow up with Dianna Rossetti, NP (Inactive) for primary care needs, as well as her other providers for optimal and coordinated care.   I spent 32 minutes in the care of the patient today including review of labs from [place], Lipids, Thyroid Function, Hematology (current and previous including abstractions from other facilities); face-to-face time discussing  her blood glucose readings/logs, discussing hypoglycemia and hyperglycemia episodes and symptoms, medications doses, her options of short and long term treatment based on the latest standards of care / guidelines;  discussion about  incorporating lifestyle medicine;  and documenting the encounter.    Please refer to Patient Instructions for Blood Glucose Monitoring and Insulin/Medications Dosing Guide"  in media tab for additional information. Please  also refer to " Patient Self Inventory" in the Media  tab for reviewed elements of pertinent patient history.  Anne Wright participated in the discussions, expressed understanding, and voiced agreement with the above plans.  All questions were answered to her satisfaction. she is encouraged to contact clinic should she have any questions or concerns prior to her return visit.  Follow up plan: - Return in about 6 months (around 10/25/2021) for F/U with Pre-visit Labs, Meter, Logs, A1c here.Glade Lloyd, MD [facility] [address] [place], [place] [address] Phone: [phone]  Fax: [phone]    04/27/2021, 11:16 AM  This note was partially dictated with voice recognition software. Similar sounding words can be transcribed inadequately or may not  be corrected upon review.

## 2021-05-17 ENCOUNTER — Other Ambulatory Visit: Payer: Self-pay

## 2021-05-17 ENCOUNTER — Telehealth: Payer: Self-pay

## 2021-05-17 MED ORDER — LISINOPRIL 20 MG PO TABS: ORAL_TABLET | ORAL | 1 refills | Status: DC

## 2021-05-17 MED ORDER — GLIPIZIDE ER 5 MG PO TB24: ORAL_TABLET | ORAL | 1 refills | Status: DC

## 2021-05-17 NOTE — Telephone Encounter (Signed)
Medications have been sent to the pharmacy requested. °

## 2021-05-17 NOTE — Telephone Encounter (Signed)
Pt requesting refills on her Lipitor and Glipizie. She was only given 30 tablets of the Lipitor in Sept. Please send to [facility] on [address]

## 2021-05-19 MED ORDER — ATORVASTATIN CALCIUM 20 MG PO TABS: ORAL_TABLET | ORAL | 0 refills | Status: DC

## 2021-05-19 NOTE — Telephone Encounter (Signed)
Patient just called back and said she did not need the Lisinopril refilled, that is what was called in. She needs the Lipitor because they only gave her 30 tablets last time and she is completely out. Thank you.

## 2021-05-19 NOTE — Addendum Note (Signed)
Addended by: Dani Gobble on: 05/19/2021 02:33 PM   Modules accepted: Orders

## 2021-06-10 ENCOUNTER — Ambulatory Visit
Admission: EM | Admit: 2021-06-10 | Discharge: 2021-06-10 | Disposition: A | Discharge status: Home / Self Care | Payer: Commercial Managed Care - PPO | Attending: Family Medicine | Admitting: Family Medicine

## 2021-06-10 DIAGNOSIS — R053 Chronic cough: Secondary | ICD-10-CM

## 2021-06-10 DIAGNOSIS — Z20822 Contact with and (suspected) exposure to covid-19: Secondary | ICD-10-CM

## 2021-06-10 DIAGNOSIS — R062 Wheezing: Secondary | ICD-10-CM

## 2021-06-10 MED ORDER — PREDNISONE 20 MG PO TABS: 40.0000 mg | ORAL_TABLET | Freq: Every day | ORAL | 0 refills | Status: DC

## 2021-06-10 MED ORDER — BENZONATATE 100 MG PO CAPS: ORAL_CAPSULE | ORAL | 0 refills | Status: DC

## 2021-06-10 NOTE — ED Provider Notes (Addendum)
[facility]   [id number] 06/10/21 Arrival Time: 0841  ASSESSMENT & PLAN:  1. Persistent cough   2. Exposure to COVID-19 virus   3. Wheezing    Viral testing sent. OTC symptom care as needed. Understands blood sugar increase while on prednisone.  Begin: Meds ordered this encounter  Medications   predniSONE (DELTASONE) 20 MG tablet    Sig: Take 2 tablets (40 mg total) by mouth daily.    Dispense:  10 tablet    Refill:  0   benzonatate (TESSALON) 100 MG capsule    Sig: Take 1 capsule by mouth every 8 (eight) hours for cough.    Dispense:  21 capsule    Refill:  0     Follow-up Information     Levonne Lapping, NP.   Specialty: Nurse Practitioner Why: If worsening or failing to improve as anticipated. Contact information: [address] [place] [address] [phone]                 Reviewed expectations re: course of current medical issues. Questions answered. Outlined signs and symptoms indicating need for more acute intervention. Understanding verbalized. After Visit Summary given.   SUBJECTIVE: History from: patient. Anne Wright is a 45 y.o. female who reports: persistent dry cough; x 2 weeks s/p URI. Wheezing past couple of days; no SOB/CVP. Denies: fever. Normal PO intake without n/v/d.   OBJECTIVE:  Vitals:   06/10/21 1005  BP: 124/84  Pulse: 89  Resp: 16  Temp: 98.7 F (37.1 C)  TempSrc: Oral  SpO2: 97%    General appearance: alert; no distress Eyes: PERRLA; EOMI; conjunctiva normal HENT: [place]; AT; with mild nasal congestion Neck: supple  Lungs: speaks full sentences without difficulty; unlabored; mild bilat wheezing Extremities: no edema Skin: warm and dry Neurologic: normal gait Psychological: alert and cooperative; normal mood and affect  Labs:  Labs Reviewed  COVID-19, FLU A+B NAA     Allergies  Allergen Reactions   Shellfish Allergy     Past Medical History:  Diagnosis Date   Diabetes  mellitus without complication ([facility])    High cholesterol    Hypertension    Social History   Socioeconomic History   Marital status: Single    Spouse name: Not on file   Number of children: Not on file   Years of education: Not on file   Highest education level: Not on file  Occupational History   Not on file  Tobacco Use   Smoking status: Former    Types: Cigarettes    Quit date: 06/29/2018    Years since quitting: 2.9   Smokeless tobacco: Never  Vaping Use   Vaping Use: Never used  Substance and Sexual Activity   Alcohol use: Yes   Drug use: No   Sexual activity: Not on file  Other Topics Concern   Not on file  Social History Narrative   Not on file   Social Determinants of Health   Financial Resource Strain: Not on file  Food Insecurity: Not on file  Transportation Needs: Not on file  Physical Activity: Not on file  Stress: Not on file  Social Connections: Not on file  Intimate Partner Violence: Not on file   Family History  Problem Relation Age of Onset   Diabetes Mother    Hypertension Mother    Asthma Son    Breast cancer Maternal Grandmother    History reviewed. No pertinent surgical history.   Mardella Layman, MD  06/10/21 1052    Mardella Layman, MD 06/10/21 1053

## 2021-06-10 NOTE — ED Triage Notes (Signed)
Patient states she has had a cough for 2 weeks. Yellow mucus is coming out with some congestion and wheezing.  Denies Fever. Denies meds.

## 2021-06-11 LAB — COVID-19, FLU A+B NAA
Influenza A, NAA: NOT DETECTED
Influenza B, NAA: NOT DETECTED
SARS-CoV-2, NAA: NOT DETECTED

## 2021-06-30 ENCOUNTER — Telehealth: Payer: Self-pay | Admitting: "Endocrinology

## 2021-06-30 MED ORDER — LANTUS SOLOSTAR 100 UNIT/ML ~~LOC~~ SOPN: 24.0000 [IU] | PEN_INJECTOR | Freq: Every day | SUBCUTANEOUS | 1 refills | Status: DC

## 2021-06-30 NOTE — Telephone Encounter (Signed)
Rx sent 

## 2021-06-30 NOTE — Telephone Encounter (Signed)
Patient requesting a referral on her Lantus, She uses [facility] on [address]

## 2021-08-11 ENCOUNTER — Telehealth: Payer: Self-pay | Admitting: "Endocrinology

## 2021-08-11 NOTE — Telephone Encounter (Signed)
Patient is requesting a referral to a foot doctor, she states she has an ingrown toenail.

## 2021-08-12 NOTE — Telephone Encounter (Signed)
Discussed with pt, understanding voiced. 

## 2021-09-06 ENCOUNTER — Other Ambulatory Visit: Payer: Self-pay | Admitting: Nurse Practitioner

## 2021-09-07 ENCOUNTER — Other Ambulatory Visit: Payer: Self-pay

## 2021-09-07 ENCOUNTER — Ambulatory Visit: Payer: Commercial Managed Care - PPO | Admitting: Nurse Practitioner

## 2021-09-07 ENCOUNTER — Encounter: Payer: Self-pay | Admitting: Nurse Practitioner

## 2021-09-07 VITALS — BP 108/72 | HR 65 | Ht 62.0 in | Wt 156.1 lb

## 2021-09-07 DIAGNOSIS — I10 Essential (primary) hypertension: Secondary | ICD-10-CM | POA: Diagnosis not present

## 2021-09-07 DIAGNOSIS — E782 Mixed hyperlipidemia: Secondary | ICD-10-CM | POA: Diagnosis not present

## 2021-09-07 DIAGNOSIS — L600 Ingrowing nail: Secondary | ICD-10-CM

## 2021-09-07 DIAGNOSIS — E559 Vitamin D deficiency, unspecified: Secondary | ICD-10-CM

## 2021-09-07 DIAGNOSIS — M79644 Pain in right finger(s): Secondary | ICD-10-CM

## 2021-09-07 DIAGNOSIS — R42 Dizziness and giddiness: Secondary | ICD-10-CM

## 2021-09-07 DIAGNOSIS — E1165 Type 2 diabetes mellitus with hyperglycemia: Secondary | ICD-10-CM

## 2021-09-07 NOTE — Assessment & Plan Note (Signed)
Take ibuprofen as needed for pain.  Symptoms could be due to arthritis or trigger finger.

## 2021-09-07 NOTE — Patient Instructions (Signed)
Please get your covid booster at your pharmacy. Please get your diabetic eye exam scheduled today.  Get your labs done today   It is important that you exercise regularly at least 30 minutes 5 times a week.  Think about what you will eat, plan ahead. Choose " clean, green, fresh or frozen" over canned, processed or packaged foods which are more sugary, salty and fatty. 70 to 75% of food eaten should be vegetables and fruit. Three meals at set times with snacks allowed between meals, but they must be fruit or vegetables. Aim to eat over a 12 hour period , example 7 am to 7 pm, and STOP after  your last meal of the day. Drink water,generally about 64 ounces per day, no other drink is as healthy. Fruit juice is best enjoyed in a healthy way, by EATING the fruit.  Thanks for choosing [facility], we consider it a privelige to serve you.

## 2021-09-07 NOTE — Assessment & Plan Note (Signed)
Takes atorvastatin 20 mg daily. Check lipid panel today

## 2021-09-07 NOTE — Assessment & Plan Note (Signed)
History of vitamin D deficiency currently not on vitamin D supplement. Check vitamin D levels today.

## 2021-09-07 NOTE — Progress Notes (Signed)
New Patient Office Visit  Subjective:  Patient ID: Anne Wright, female    DOB: [birth year]  Age: 46 y.o. MRN: [id number]  CC:  Chief Complaint  Patient presents with   New Patient (Initial Visit)    NP middle finger on right hand is sore/stiff for about a month since around 08/07/21    HPI Anne Wright presents to establish care. She has been seeing endo for DM . She does not remember the name of her last PCP.  HLD. Atorvastatin 46m daily T2DM, Glipizide 540mdaily, lantus 24 units at bedtime HTN . Lisinopril 2063maily.   Pt stated that she had a PAP done in 2021 result was normal, last mamo was in 2022, had [place] Right breast done due to fatty tissue. Results were normal per pt.  Pt is due for foot exam , eye exam , Covid booster.   Pt c/o ingrown toes on both foot, she stated that the site is painful , wants a referral to podiatry.   Pt c/o been really tired, she was previously taking vitamin B12 tablets but stopped taking it , cant remember why she stopped.    Pt stated that she was previously taking meds for depression but that she is fine now, denies SI, HI.    PT c/o dizziness mostly  in the morning, dizziness goes away as the day goes on, denies syncope, chest pain , palpitations.    No past surgical history on file.  Family History  Problem Relation Age of Onset   Diabetes Mother    Hypertension Mother    Asthma Son    Breast cancer Maternal Grandmother     Social History   Socioeconomic History   Marital status: Single    Spouse name: Not on file   Number of children: Not on file   Years of education: Not on file   Highest education level: Not on file  Occupational History   Not on file  Tobacco Use   Smoking status: Former    Types: Cigarettes    Quit date: 06/29/2018    Years since quitting: 3.1   Smokeless tobacco: Never  Vaping Use   Vaping Use: Never used  Substance and Sexual Activity   Alcohol use: Not Currently   Drug use: No    Sexual activity: Not on file  Other Topics Concern   Not on file  Social History Narrative   Not on file   Social Determinants of Health   Financial Resource Strain: Not on file  Food Insecurity: Not on file  Transportation Needs: Not on file  Physical Activity: Not on file  Stress: Not on file  Social Connections: Not on file  Intimate Partner Violence: Not on file    ROS Review of Systems  Constitutional: Negative.   Respiratory: Negative.    Cardiovascular: Negative.   Gastrointestinal:  Positive for constipation.  Psychiatric/Behavioral: Negative.     Objective:   Today's Vitals: BP 108/72 (BP Location: Left Arm, Patient Position: Sitting, Cuff Size: Normal)    Pulse 65    Ht _0  (1.575 m)    Wt 156 lb 1.3 oz (70.8 kg)    LMP 08/26/2021 (Approximate)    SpO2 100%    BMI 28.55 kg/m   Physical Exam Constitutional:      General: She is not in acute distress.    Appearance: She is not ill-appearing, toxic-appearing or diaphoretic.  Cardiovascular:     Rate and Rhythm: Normal rate  and regular rhythm.     Pulses: Normal pulses.     Heart sounds: Normal heart sounds. No murmur heard.   No friction rub. No gallop.  Pulmonary:     Effort: No respiratory distress.     Breath sounds: No stridor. No wheezing, rhonchi or rales.  Chest:     Chest wall: No tenderness.  Abdominal:     Palpations: Abdomen is soft.  Neurological:     General: No focal deficit present.     Mental Status: She is alert and oriented to person, place, and time.  Psychiatric:        Mood and Affect: Mood normal.        Behavior: Behavior normal.        Thought Content: Thought content normal.        Judgment: Judgment normal.    Assessment & Plan:   Problem List Items Addressed This Visit   None   Outpatient Encounter Medications as of 09/07/2021  Medication Sig   aspirin EC 81 MG tablet Take 81 mg by mouth daily.   atorvastatin (LIPITOR) 20 MG tablet TAKE 1 TABLET(20 MG) BY MOUTH AT  BEDTIME   Blood Glucose Monitoring Suppl (ACCU-CHEK GUIDE ME) w/Device KIT 1 Piece by Does not apply route as directed.   glipiZIDE (GLUCOTROL XL) 5 MG 24 hr tablet TAKE 1 TABLET(5 MG) BY MOUTH DAILY WITH BREAKFAST   glucose blood (ACCU-CHEK GUIDE) test strip Use as instructed   insulin glargine (LANTUS SOLOSTAR) 100 UNIT/ML Solostar Pen Inject 24 Units into the skin at bedtime.   Insulin Pen Needle (BD PEN NEEDLE NANO U/F) 32G X 4 MM MISC Once daily   Lancets (FREESTYLE) lancets Test blod glucose four times daily. Use as instructed   lisinopril (ZESTRIL) 20 MG tablet Take 20 mg by mouth daily.   [DISCONTINUED] benzonatate (TESSALON) 100 MG capsule Take 1 capsule by mouth every 8 (eight) hours for cough. (Patient not taking: Reported on 09/07/2021)   [DISCONTINUED] predniSONE (DELTASONE) 20 MG tablet Take 2 tablets (40 mg total) by mouth daily. (Patient not taking: Reported on 09/07/2021)   No facility-administered encounter medications on file as of 09/07/2021.    Follow-up: No follow-ups on file.   Renee Rival, FNP

## 2021-09-07 NOTE — Assessment & Plan Note (Signed)
DASH diet and commitment to daily physical activity for a minimum of 30 minutes discussed and encouraged, as a part of hypertension management. The importance of attaining a healthy weight is also discussed.  BP/Weight 09/07/2021 06/10/2021 04/27/2021 01/20/2021 10/20/2020 09/17/2020 09/16/2020  Systolic BP 108 124 118 110 150 142 -  Diastolic BP 72 84 76 76 97 87 -  Wt. (Lbs) 156.08 - 163.4 164.8 159.8 - 147  BMI 28.55 - 29.89 30.14 29.23 - 26.89   Continue lisinopril 20 mg daily.

## 2021-09-07 NOTE — Assessment & Plan Note (Signed)
Bilateral ingrwon great toe nail Refer to podiatry.

## 2021-09-07 NOTE — Assessment & Plan Note (Signed)
Check CBC, B12 and folate. TSH was normal for some months ago. Could be due to blood pressure medication, will not make any changes to BP meds today.

## 2021-09-07 NOTE — Assessment & Plan Note (Signed)
Followed by Dr. Fransico Him. Check lipid panel today. Diabetic foot exam completed today We will schedule patient for her diabetic eye exam. Takes Lantus 24 units daily glipizide 5 mg daily. On atorvastatin 20 mg daily.

## 2021-09-08 ENCOUNTER — Other Ambulatory Visit (HOSPITAL_COMMUNITY): Payer: Self-pay

## 2021-09-08 ENCOUNTER — Other Ambulatory Visit: Payer: Self-pay | Admitting: Nurse Practitioner

## 2021-09-08 DIAGNOSIS — E559 Vitamin D deficiency, unspecified: Secondary | ICD-10-CM

## 2021-09-08 LAB — LIPID PANEL
Chol/HDL Ratio: 2.4 ratio (ref 0.0–4.4)
Cholesterol, Total: 145 mg/dL (ref 100–199)
HDL: 61 mg/dL (ref >39)
LDL Chol Calc (NIH): 70 mg/dL (ref 0–99)
Triglycerides: 68 mg/dL (ref 0–149)
VLDL Cholesterol Cal: 14 mg/dL (ref 5–40)

## 2021-09-08 LAB — CBC WITH DIFFERENTIAL/PLATELET
Basophils Absolute: 0.1 10*3/uL (ref 0.0–0.2)
Basos: 1 %
EOS (ABSOLUTE): 0.3 10*3/uL (ref 0.0–0.4)
Eos: 4 %
Hematocrit: 36 % (ref 34.0–46.6)
Hemoglobin: 12.1 g/dL (ref 11.1–15.9)
Immature Grans (Abs): 0 10*3/uL (ref 0.0–0.1)
Immature Granulocytes: 0 %
Lymphocytes Absolute: 2.8 10*3/uL (ref 0.7–3.1)
Lymphs: 37 %
MCH: 31.2 pg (ref 26.6–33.0)
MCHC: 33.6 g/dL (ref 31.5–35.7)
MCV: 93 fL (ref 79–97)
Monocytes Absolute: 0.6 10*3/uL (ref 0.1–0.9)
Monocytes: 8 %
Neutrophils Absolute: 3.9 10*3/uL (ref 1.4–7.0)
Neutrophils: 50 %
Platelets: 272 10*3/uL (ref 150–450)
RBC: 3.88 x10E6/uL (ref 3.77–5.28)
RDW: 13 % (ref 11.7–15.4)
WBC: 7.7 10*3/uL (ref 3.4–10.8)

## 2021-09-08 LAB — CMP14+EGFR
ALT: 71 IU/L — ABNORMAL HIGH (ref 0–32)
AST: 45 IU/L — ABNORMAL HIGH (ref 0–40)
Albumin/Globulin Ratio: 2 (ref 1.2–2.2)
Albumin: 4 g/dL (ref 3.8–4.8)
Alkaline Phosphatase: 72 IU/L (ref 44–121)
BUN/Creatinine Ratio: 30 — ABNORMAL HIGH (ref 9–23)
BUN: 24 mg/dL (ref 6–24)
Bilirubin Total: 0.2 mg/dL (ref 0.0–1.2)
CO2: 24 mmol/L (ref 20–29)
Calcium: 9.2 mg/dL (ref 8.7–10.2)
Chloride: 106 mmol/L (ref 96–106)
Creatinine, Ser: 0.8 mg/dL (ref 0.57–1.00)
Globulin, Total: 2 g/dL (ref 1.5–4.5)
Glucose: 178 mg/dL — ABNORMAL HIGH (ref 70–99)
Potassium: 5.2 mmol/L (ref 3.5–5.2)
Sodium: 141 mmol/L (ref 134–144)
Total Protein: 6 g/dL (ref 6.0–8.5)
eGFR: 94 mL/min/{1.73_m2} (ref >59)

## 2021-09-08 LAB — B12 AND FOLATE PANEL
Folate: 7.5 ng/mL (ref >3.0)
Vitamin B-12: 868 pg/mL (ref 232–1245)

## 2021-09-08 LAB — VITAMIN D 25 HYDROXY (VIT D DEFICIENCY, FRACTURES): Vit D, 25-Hydroxy: 12.8 ng/mL — ABNORMAL LOW (ref 30.0–100.0)

## 2021-09-08 MED ORDER — VITAMIN D (ERGOCALCIFEROL) 1.25 MG (50000 UNIT) PO CAPS
50000.0000 [IU] | ORAL_CAPSULE | ORAL | 0 refills | Status: DC
  Filled 2021-09-08: qty 8, 56d supply, fill #0

## 2021-09-08 NOTE — Progress Notes (Signed)
Please review results with patient.  Vitamin D level is low, I have ordered vitamin D 50,000 units to be used once weekly for 8 weeks, afterwards she should take vitamin D 1000 units daily.   Her liver function is elevated, patient should avoid taking Tylenol and alcohol.  We will monitor her levels if it remains elevated at next check I will refer her to GI.  Cholesterol levels is not at goal, patient should Eat a healthy diet, including lots of fruits and vegetables. Avoid foods with a lot of saturated and trans fats, such as red meat, butter, fried foods and cheese . Maintain a healthy weight.

## 2021-09-13 ENCOUNTER — Other Ambulatory Visit: Payer: Self-pay

## 2021-09-13 ENCOUNTER — Other Ambulatory Visit (HOSPITAL_COMMUNITY): Payer: Self-pay

## 2021-09-13 ENCOUNTER — Telehealth: Payer: Self-pay | Admitting: Nurse Practitioner

## 2021-09-13 DIAGNOSIS — E559 Vitamin D deficiency, unspecified: Secondary | ICD-10-CM

## 2021-09-13 MED ORDER — VITAMIN D (ERGOCALCIFEROL) 1.25 MG (50000 UNIT) PO CAPS: 50000.0000 [IU] | ORAL_CAPSULE | ORAL | 0 refills | Status: DC

## 2021-09-13 NOTE — Telephone Encounter (Signed)
Please take [facility] OFF the list   Medications VIT D should be sent  to [facility] on [address]

## 2021-09-13 NOTE — Telephone Encounter (Signed)
Called pt advised that I took off  pharmacy and sent rx to [facility] pt verbalized understanding

## 2021-09-15 ENCOUNTER — Other Ambulatory Visit: Payer: Self-pay

## 2021-09-15 ENCOUNTER — Encounter: Payer: Self-pay | Admitting: Podiatry

## 2021-09-15 ENCOUNTER — Ambulatory Visit: Payer: Commercial Managed Care - PPO | Admitting: Podiatry

## 2021-09-15 DIAGNOSIS — L84 Corns and callosities: Secondary | ICD-10-CM | POA: Diagnosis not present

## 2021-09-15 DIAGNOSIS — B9689 Other specified bacterial agents as the cause of diseases classified elsewhere: Secondary | ICD-10-CM | POA: Insufficient documentation

## 2021-09-15 DIAGNOSIS — F17200 Nicotine dependence, unspecified, uncomplicated: Secondary | ICD-10-CM | POA: Insufficient documentation

## 2021-09-15 DIAGNOSIS — E1165 Type 2 diabetes mellitus with hyperglycemia: Secondary | ICD-10-CM | POA: Diagnosis not present

## 2021-09-15 DIAGNOSIS — L600 Ingrowing nail: Secondary | ICD-10-CM

## 2021-09-15 DIAGNOSIS — N760 Acute vaginitis: Secondary | ICD-10-CM | POA: Insufficient documentation

## 2021-09-15 DIAGNOSIS — E119 Type 2 diabetes mellitus without complications: Secondary | ICD-10-CM | POA: Insufficient documentation

## 2021-09-15 NOTE — Progress Notes (Signed)
°  Subjective:  Patient ID: Anne Wright, female    DOB: 06/26/1976,   MRN: [id number]  Chief Complaint  Patient presents with   Callouses    I have a spot on the ball of the right foot and the doctor told me not to touch it and I am a diabetic    Nail Problem    I am having some issues with the big toenails and they do curl in and they have been draining some     46 y.o. female presents for concern of bilateral ingrown toenails and concern for callus on the bottom of her right foot. Reltaes she has been dealing with toes for years and they are often painful. Has tried trimming but no relief. She is diabetic and last A1c was 7.8  . Denies any other pedal complaints. Denies n/v/f/c.   Past Medical History:  Diagnosis Date   Diabetes mellitus without complication (HCC)    High cholesterol    Hypertension     Objective:  Physical Exam: Vascular: DP/PT pulses 2/4 bilateral. CFT <3 seconds. Normal hair growth on digits. No edema.  Skin. No lacerations or abrasions bilateral feet. Incurvation of bilateral great toenails of bilateral borders. Pincer type nails.  Musculoskeletal: MMT 5/5 bilateral lower extremities in DF, PF, Inversion and Eversion. Deceased ROM in DF of ankle joint.  Neurological: Sensation intact to light touch.   Assessment:   1. Ingrown nail of great toe of right foot   2. Ingrown nail of great toe of left foot   3. Uncontrolled type 2 diabetes mellitus with hyperglycemia (HCC)   4. Callus      Plan:  Patient was evaluated and treated and all questions answered. Patient requesting removal of ingrown nail today. Procedure below.  Discussed procedure and post procedure care and patient expressed understanding.  Hyperkeratotic tissue debrided without incident.  Will follow-up in 2 weeks for nail check or sooner if any problems arise.    Procedure:  Procedure: partial Nail Avulsion of bilaterally hallux bilateral nail border.  Surgeon: Louann Sjogren, DPM   Pre-op Dx: Ingrown toenail without infection Post-op: Same  Place of Surgery: Office exam room.  Indications for surgery: Painful and ingrown toenail.    The patient is requesting removal of nail with chemical matrixectomy. Risks and complications were discussed with the patient for which they understand and written consent was obtained. Under sterile conditions a total of 3 mL of  1% lidocaine plain was infiltrated in a hallux block fashion. Once anesthetized, the skin was prepped in sterile fashion. A tourniquet was then applied. Next the bilateral aspect of hallux nail border was then sharply excised making sure to remove the entire offending nail border.  Next phenol was then applied under standard conditions and copiously irrigated. Silvadene was applied. A dry sterile dressing was applied. After application of the dressing the tourniquet was removed and there is found to be an immediate capillary refill time to the digit. The patient tolerated the procedure well without any complications. Post procedure instructions were discussed the patient for which he verbally understood. Follow-up in two weeks for nail check or sooner if any problems are to arise. Discussed signs/symptoms of infection and directed to call the office immediately should any occur or go directly to the emergency room. In the meantime, encouraged to call the office with any questions, concerns, changes symptoms.   Louann Sjogren, DPM

## 2021-09-15 NOTE — Patient Instructions (Signed)

## 2021-09-16 ENCOUNTER — Encounter: Payer: Self-pay | Admitting: Nurse Practitioner

## 2021-09-16 ENCOUNTER — Encounter: Payer: Commercial Managed Care - PPO | Admitting: Nurse Practitioner

## 2021-09-16 ENCOUNTER — Ambulatory Visit (INDEPENDENT_AMBULATORY_CARE_PROVIDER_SITE_OTHER): Payer: Commercial Managed Care - PPO | Admitting: Nurse Practitioner

## 2021-09-16 VITALS — BP 122/82 | HR 90 | Ht 62.0 in | Wt 157.0 lb

## 2021-09-16 DIAGNOSIS — Z0000 Encounter for general adult medical examination without abnormal findings: Secondary | ICD-10-CM | POA: Diagnosis not present

## 2021-09-16 DIAGNOSIS — I10 Essential (primary) hypertension: Secondary | ICD-10-CM | POA: Diagnosis not present

## 2021-09-16 DIAGNOSIS — Z0001 Encounter for general adult medical examination with abnormal findings: Secondary | ICD-10-CM | POA: Insufficient documentation

## 2021-09-16 DIAGNOSIS — R748 Abnormal levels of other serum enzymes: Secondary | ICD-10-CM | POA: Diagnosis not present

## 2021-09-16 NOTE — Progress Notes (Signed)
Established Patient Office Visit  Subjective:  Patient ID: Anne Wright, female    DOB: October 08, 1975  Age: 46 y.o. MRN: [id number]  CC:  Chief Complaint  Patient presents with   Annual Exam    Cpe had both ingrown toenails removed yesterday     HPI Anne Wright presents for annual physical exam. She is here for her physical which needs to be done in order for her to go back to school.    She has had her ingrown bit toe nails treated yesterday, pt stated that her big toes are still sore, has a follow up appointment in two weeks.     She has eye exam coming up in March, up to date with mammogram and PAP.     Past Medical History:  Diagnosis Date   Diabetes mellitus without complication (HCC)    High cholesterol    Hypertension     History reviewed. No pertinent surgical history.  Family History  Problem Relation Age of Onset   Diabetes Mother    Hypertension Mother    Asthma Brother    Asthma Son    Breast cancer Maternal Grandmother    Colon cancer Neg Hx    Cancer - Lung Neg Hx    Cervical cancer Neg Hx     Social History   Socioeconomic History   Marital status: Legally Separated    Spouse name: Not on file   Number of children: 2   Years of education: Not on file   Highest education level: Not on file  Occupational History   Not on file  Tobacco Use   Smoking status: Former    Types: Cigarettes    Quit date: 06/29/2018    Years since quitting: 3.2   Smokeless tobacco: Never   Tobacco comments:    She smoked 10 cigarettes a day for 15 years , quit 2019.   Vaping Use   Vaping Use: Never used  Substance and Sexual Activity   Alcohol use: Not Currently    Comment: occa   Drug use: No   Sexual activity: Not Currently  Other Topics Concern   Not on file  Social History Narrative   Lives with her son.    Social Determinants of Health   Financial Resource Strain: Not on file  Food Insecurity: Not on file  Transportation Needs: Not on file   Physical Activity: Not on file  Stress: Not on file  Social Connections: Not on file  Intimate Partner Violence: Not on file    Outpatient Medications Prior to Visit  Medication Sig Dispense Refill   aspirin EC 81 MG tablet Take 81 mg by mouth daily.     atorvastatin (LIPITOR) 20 MG tablet TAKE 1 TABLET(20 MG) BY MOUTH AT BEDTIME 90 tablet 0   Blood Glucose Monitoring Suppl (ACCU-CHEK GUIDE ME) w/Device KIT 1 Piece by Does not apply route as directed. 1 kit 0   glipiZIDE (GLUCOTROL XL) 5 MG 24 hr tablet TAKE 1 TABLET(5 MG) BY MOUTH DAILY WITH BREAKFAST 90 tablet 1   glucose blood (ACCU-CHEK GUIDE) test strip Use as instructed 150 each 2   insulin glargine (LANTUS SOLOSTAR) 100 UNIT/ML Solostar Pen Inject 24 Units into the skin at bedtime. 15 mL 1   Insulin Pen Needle (BD PEN NEEDLE NANO U/F) 32G X 4 MM MISC Once daily 100 each 3   Lancets (FREESTYLE) lancets Test blod glucose four times daily. Use as instructed 100 each 12   lisinopril (ZESTRIL)  20 MG tablet Take 20 mg by mouth daily.     Vitamin D, Ergocalciferol, (DRISDOL) 1.25 MG (50000 UNIT) CAPS capsule Take 1 capsule (50,000 Units total) by mouth every 7 (seven) days. 8 capsule 0   No facility-administered medications prior to visit.    Allergies  Allergen Reactions   Amoxicillin    Shellfish Allergy     ROS Review of Systems  Constitutional: Negative.   HENT: Negative.    Eyes: Negative.   Respiratory: Negative.    Cardiovascular: Negative.   Gastrointestinal: Negative.   Endocrine: Negative.   Genitourinary: Negative.   Musculoskeletal: Negative.   Skin: Negative.   Allergic/Immunologic: Negative.   Neurological: Negative.   Hematological: Negative.   Psychiatric/Behavioral: Negative.       Objective:    Physical Exam Vitals reviewed.  Constitutional:      General: She is not in acute distress.    Appearance: Normal appearance. She is not ill-appearing, toxic-appearing or diaphoretic.  HENT:     Head:  Normocephalic and atraumatic.     Right Ear: Tympanic membrane, ear canal and external ear normal. There is no impacted cerumen.     Left Ear: Tympanic membrane, ear canal and external ear normal. There is no impacted cerumen.     Nose: Nose normal. No congestion or rhinorrhea.     Mouth/Throat:     Mouth: Mucous membranes are moist.     Pharynx: Oropharynx is clear. No oropharyngeal exudate or posterior oropharyngeal erythema.  Eyes:     General: No scleral icterus.       Right eye: No discharge.        Left eye: No discharge.     Extraocular Movements: Extraocular movements intact.     Conjunctiva/sclera: Conjunctivae normal.     Pupils: Pupils are equal, round, and reactive to light.  Neck:     Vascular: No carotid bruit.  Cardiovascular:     Rate and Rhythm: Normal rate and regular rhythm.     Pulses: Normal pulses.     Heart sounds: Normal heart sounds. No murmur heard.   No friction rub. No gallop.  Pulmonary:     Effort: No respiratory distress.     Breath sounds: No stridor. No wheezing, rhonchi or rales.  Chest:     Chest wall: No tenderness.  Abdominal:     General: There is no distension.     Palpations: Abdomen is soft. There is no mass.     Tenderness: There is no abdominal tenderness. There is no right CVA tenderness, left CVA tenderness, guarding or rebound.     Hernia: No hernia is present.  Musculoskeletal:        General: No swelling, tenderness or deformity.     Cervical back: Normal range of motion and neck supple. No rigidity or tenderness.     Right lower leg: No edema.     Left lower leg: No edema.  Lymphadenopathy:     Cervical: No cervical adenopathy.  Skin:    General: Skin is warm and dry.     Capillary Refill: Capillary refill takes less than 2 seconds.     Coloration: Skin is not jaundiced or pale.     Findings: No bruising, erythema, lesion or rash.     Comments: Bilateral big toes with dressing  Neurological:     Mental Status: She is alert  and oriented to person, place, and time.     Cranial Nerves: No cranial nerve deficit.  Sensory: No sensory deficit.     Motor: No weakness.     Coordination: Coordination normal.     Gait: Gait normal.     Deep Tendon Reflexes: Reflexes normal.  Psychiatric:        Mood and Affect: Mood normal.        Behavior: Behavior normal.        Thought Content: Thought content normal.        Judgment: Judgment normal.    BP 122/82    Pulse 90    Ht $R'5\' 2"'mt$  (1.575 m)    Wt 157 lb (71.2 kg)    LMP 08/26/2021 (Approximate)    SpO2 99%    BMI 28.72 kg/m  Wt Readings from Last 3 Encounters:  09/16/21 157 lb (71.2 kg)  09/07/21 156 lb 1.3 oz (70.8 kg)  04/27/21 163 lb 6.4 oz (74.1 kg)     Health Maintenance Due  Topic Date Due   HIV Screening  Never done   Hepatitis C Screening  Never done   COVID-19 Vaccine (3 - Booster) 09/26/2020    There are no preventive care reminders to display for this patient.  Lab Results  Component Value Date   TSH 1.590 04/21/2021   Lab Results  Component Value Date   WBC 7.7 09/07/2021   HGB 12.1 09/07/2021   HCT 36.0 09/07/2021   MCV 93 09/07/2021   PLT 272 09/07/2021   Lab Results  Component Value Date   NA 141 09/07/2021   K 5.2 09/07/2021   CO2 24 09/07/2021   GLUCOSE 178 (H) 09/07/2021   BUN 24 09/07/2021   CREATININE 0.80 09/07/2021   BILITOT 0.2 09/07/2021   ALKPHOS 72 09/07/2021   AST 45 (H) 09/07/2021   ALT 71 (H) 09/07/2021   PROT 6.0 09/07/2021   ALBUMIN 4.0 09/07/2021   CALCIUM 9.2 09/07/2021   ANIONGAP 7 09/16/2020   EGFR 94 09/07/2021   Lab Results  Component Value Date   CHOL 145 09/07/2021   Lab Results  Component Value Date   HDL 61 09/07/2021   Lab Results  Component Value Date   LDLCALC 70 09/07/2021   Lab Results  Component Value Date   TRIG 68 09/07/2021   Lab Results  Component Value Date   CHOLHDL 2.4 09/07/2021   Lab Results  Component Value Date   HGBA1C 7.9 (A) 04/27/2021      Assessment  & Plan:   Problem List Items Addressed This Visit   None   No orders of the defined types were placed in this encounter.   Follow-up: No follow-ups on file.    Renee Rival, FNP

## 2021-09-16 NOTE — Assessment & Plan Note (Signed)
Annual exam as documented.  ?Counseling done include healthy lifestyle involving committing to 150 minutes of exercise per week, heart healthy diet, and attaining healthy weight. The importance of adequate sleep also discussed.  ?Regular use of seat belt and home safety were also discussed . ?Changes in health habits are decided on by patient with goals and time frames set for achieving them. ?Immunization and cancer screening  needs are specifically addressed at this visit.   ?

## 2021-09-16 NOTE — Patient Instructions (Signed)

## 2021-09-16 NOTE — Assessment & Plan Note (Signed)
DASH diet and commitment to daily physical activity for a minimum of 30 minutes discussed and encouraged, as a part of hypertension management. The importance of attaining a healthy weight is also discussed.  BP/Weight 09/16/2021 09/07/2021 06/10/2021 04/27/2021 01/20/2021 10/20/2020 09/17/2020  Systolic BP 122 108 124 118 110 150 142  Diastolic BP 82 72 84 76 76 97 87  Wt. (Lbs) 157 156.08 - 163.4 164.8 159.8 -  BMI 28.72 28.55 - 29.89 30.14 29.23 -

## 2021-09-16 NOTE — Assessment & Plan Note (Addendum)
Denies abdominal pain.Refer to GI, avoid tylenol and alcohol. Hepc lab today.

## 2021-09-17 LAB — HEPATITIS C ANTIBODY: Hep C Virus Ab: NONREACTIVE

## 2021-09-17 NOTE — Progress Notes (Signed)
Hep c  screening was negative.

## 2021-09-22 ENCOUNTER — Encounter: Payer: Self-pay | Admitting: Internal Medicine

## 2021-09-28 ENCOUNTER — Ambulatory Visit: Payer: Commercial Managed Care - PPO

## 2021-09-28 ENCOUNTER — Other Ambulatory Visit: Payer: Self-pay

## 2021-09-28 ENCOUNTER — Ambulatory Visit: Payer: Commercial Managed Care - PPO | Admitting: Nurse Practitioner

## 2021-10-05 ENCOUNTER — Ambulatory Visit (INDEPENDENT_AMBULATORY_CARE_PROVIDER_SITE_OTHER): Payer: Commercial Managed Care - PPO | Admitting: Podiatry

## 2021-10-05 DIAGNOSIS — Z91199 Patient's noncompliance with other medical treatment and regimen due to unspecified reason: Secondary | ICD-10-CM

## 2021-10-05 NOTE — Progress Notes (Signed)
No show

## 2021-10-23 LAB — COMPREHENSIVE METABOLIC PANEL
ALT: 53 IU/L — ABNORMAL HIGH (ref 0–32)
AST: 28 IU/L (ref 0–40)
Albumin/Globulin Ratio: 2.2 (ref 1.2–2.2)
Albumin: 3.9 g/dL (ref 3.8–4.8)
Alkaline Phosphatase: 71 IU/L (ref 44–121)
BUN/Creatinine Ratio: 24 — ABNORMAL HIGH (ref 9–23)
BUN: 20 mg/dL (ref 6–24)
Bilirubin Total: 0.3 mg/dL (ref 0.0–1.2)
CO2: 20 mmol/L (ref 20–29)
Calcium: 9.3 mg/dL (ref 8.7–10.2)
Chloride: 106 mmol/L (ref 96–106)
Creatinine, Ser: 0.85 mg/dL (ref 0.57–1.00)
Globulin, Total: 1.8 g/dL (ref 1.5–4.5)
Glucose: 200 mg/dL — ABNORMAL HIGH (ref 70–99)
Potassium: 5.1 mmol/L (ref 3.5–5.2)
Sodium: 139 mmol/L (ref 134–144)
Total Protein: 5.7 g/dL — ABNORMAL LOW (ref 6.0–8.5)
eGFR: 87 mL/min/{1.73_m2} (ref >59)

## 2021-10-25 ENCOUNTER — Ambulatory Visit: Payer: Commercial Managed Care - PPO | Admitting: "Endocrinology

## 2021-11-01 ENCOUNTER — Encounter: Payer: Self-pay | Admitting: "Endocrinology

## 2021-11-01 ENCOUNTER — Ambulatory Visit (INDEPENDENT_AMBULATORY_CARE_PROVIDER_SITE_OTHER): Payer: Commercial Managed Care - PPO | Admitting: "Endocrinology

## 2021-11-01 VITALS — BP 118/74 | HR 92 | Ht 62.0 in | Wt 157.0 lb

## 2021-11-01 DIAGNOSIS — E782 Mixed hyperlipidemia: Secondary | ICD-10-CM

## 2021-11-01 DIAGNOSIS — E559 Vitamin D deficiency, unspecified: Secondary | ICD-10-CM | POA: Diagnosis not present

## 2021-11-01 DIAGNOSIS — I10 Essential (primary) hypertension: Secondary | ICD-10-CM

## 2021-11-01 DIAGNOSIS — Z91199 Patient's noncompliance with other medical treatment and regimen due to unspecified reason: Secondary | ICD-10-CM

## 2021-11-01 DIAGNOSIS — E1165 Type 2 diabetes mellitus with hyperglycemia: Secondary | ICD-10-CM

## 2021-11-01 LAB — POCT GLYCOSYLATED HEMOGLOBIN (HGB A1C): HbA1c, POC (controlled diabetic range): 9.3 % — AB (ref 0.0–7.0)

## 2021-11-01 NOTE — Patient Instructions (Signed)

## 2021-11-01 NOTE — Progress Notes (Signed)
? ?                                                              ?     11/01/2021, 11:41 AM ? ?Endocrinology follow-up note ? ?Subjective:  ? ? Patient ID: Anne Wright, female    DOB: Feb 11, 1976.  ?Anne Wright is being seen in follow-up after she was seen in consultation for management of currently uncontrolled symptomatic diabetes requested by  Renee Rival, FNP. ? ? ?Past Medical History:  ?Diagnosis Date  ? Diabetes mellitus without complication ([facility])   ? High cholesterol   ? Hypertension   ? ? ?History reviewed. No pertinent surgical history. ? ?Social History  ? ?Socioeconomic History  ? Marital status: Legally Separated  ?  Spouse name: Not on file  ? Number of children: 2  ? Years of education: Not on file  ? Highest education level: Not on file  ?Occupational History  ? Not on file  ?Tobacco Use  ? Smoking status: Former  ?  Types: Cigarettes  ?  Quit date: 06/29/2018  ?  Years since quitting: 3.3  ? Smokeless tobacco: Never  ? Tobacco comments:  ?  She smoked 10 cigarettes a day for 15 years , quit 2019.   ?Vaping Use  ? Vaping Use: Never used  ?Substance and Sexual Activity  ? Alcohol use: Not Currently  ?  Comment: occa  ? Drug use: No  ? Sexual activity: Not Currently  ?Other Topics Concern  ? Not on file  ?Social History Narrative  ? Lives with her son.   ? ?Social Determinants of Health  ? ?Financial Resource Strain: Not on file  ?Food Insecurity: Not on file  ?Transportation Needs: Not on file  ?Physical Activity: Not on file  ?Stress: Not on file  ?Social Connections: Not on file  ? ? ?Family History  ?Problem Relation Age of Onset  ? Diabetes Mother   ? Hypertension Mother   ? Asthma Brother   ? Asthma Son   ? Breast cancer Maternal Grandmother   ? Colon cancer Neg Hx   ? Cancer - Lung Neg Hx   ? Cervical cancer Neg Hx   ? ? ?Outpatient Encounter Medications as of 11/01/2021  ?Medication Sig  ? Cholecalciferol (VITAMIN D) 50 MCG (2000 UT) CAPS Take 2,000 Units by mouth  daily with breakfast.  ? aspirin EC 81 MG tablet Take 81 mg by mouth daily.  ? atorvastatin (LIPITOR) 20 MG tablet TAKE 1 TABLET(20 MG) BY MOUTH AT BEDTIME  ? Blood Glucose Monitoring Suppl (ACCU-CHEK GUIDE ME) w/Device KIT 1 Piece by Does not apply route as directed.  ? glipiZIDE (GLUCOTROL XL) 5 MG 24 hr tablet TAKE 1 TABLET(5 MG) BY MOUTH DAILY WITH BREAKFAST  ? glucose blood (ACCU-CHEK GUIDE) test strip Use as instructed  ? insulin glargine (LANTUS SOLOSTAR) 100 UNIT/ML Solostar Pen Inject 24 Units into the skin at bedtime.  ? Insulin Pen Needle (BD PEN NEEDLE NANO U/F) 32G X 4 MM MISC Once daily  ? Lancets (FREESTYLE) lancets Test blod glucose four times daily. Use as instructed  ? lisinopril (ZESTRIL) 20 MG tablet Take 20 mg by mouth daily.  ? [DISCONTINUED] Vitamin D, Ergocalciferol, (DRISDOL) 1.25 MG (50000 UNIT) CAPS capsule Take 1 capsule (  50,000 Units total) by mouth every 7 (seven) days.  ? ?No facility-administered encounter medications on file as of 11/01/2021.  ? ? ?ALLERGIES: ?Allergies  ?Allergen Reactions  ? Amoxicillin   ? Shellfish Allergy   ? ? ?VACCINATION STATUS: ?Immunization History  ?Administered Date(s) Administered  ? Hepatitis B, adult 08/06/2019, 09/11/2019  ? Influenza,inj,quad, With Preservative 08/01/2019  ? Influenza-Unspecified 04/29/2021  ? Moderna Sars-Covid-2 Vaccination 04/02/2020  ? PFIZER(Purple Top)SARS-COV-2 Vaccination 08/01/2020  ? PPD Test 04/08/2020  ? Tdap 04/10/2015, 08/01/2019  ? ? ?Diabetes ?She presents for her follow-up diabetic visit. She has type 2 diabetes mellitus. Onset time: She was diagnosed at approximate age of 89 years.  She did have a gestational diabetes during her first pregnancy 19 years ago. Her disease course has been worsening. There are no hypoglycemic associated symptoms. Pertinent negatives for hypoglycemia include no confusion, headaches, pallor or seizures. Associated symptoms include polydipsia and polyuria. Pertinent negatives for diabetes  include no blurred vision, no chest pain, no fatigue and no polyphagia. There are no hypoglycemic complications. Symptoms are worsening. There are no diabetic complications. Risk factors for coronary artery disease include diabetes mellitus and dyslipidemia. Current diabetic treatments: She is not tolerating her Metformin.  Currently taking Trulicity 5.80 mg subcutaneously weekly. Her weight is fluctuating minimally. She is following a generally unhealthy diet. When asked about meal planning, she reported none. She has not had a previous visit with a dietitian. She rarely participates in exercise. Her home blood glucose trend is increasing steadily. (She presents with her meter showing infrequent and inadequate monitoring of blood glucose.  She has 10 readings the last 14 days averaging 151.  Her point-of-care A1c is 9.3%, increasing from 7.9% during her last visit.  She did have some rare, random hypoglycemia.   ?) An ACE inhibitor/angiotensin II receptor blocker is not being taken. Eye exam is not current.  ?Hyperlipidemia ?This is a chronic problem. The current episode started more than 1 year ago. The problem is uncontrolled. Exacerbating diseases include diabetes. Pertinent negatives include no chest pain, myalgias or shortness of breath. Current antihyperlipidemic treatment includes statins. Risk factors for coronary artery disease include diabetes mellitus, dyslipidemia and family history.  ? ?Review of Systems  ?Constitutional:  Negative for chills, fatigue, fever and unexpected weight change.  ?HENT:  Negative for trouble swallowing and voice change.   ?Eyes:  Negative for blurred vision and visual disturbance.  ?Respiratory:  Negative for cough, shortness of breath and wheezing.   ?Cardiovascular:  Negative for chest pain, palpitations and leg swelling.  ?Gastrointestinal:  Negative for diarrhea, nausea and vomiting.  ?Endocrine: Positive for polydipsia and polyuria. Negative for cold intolerance, heat  intolerance and polyphagia.  ?Musculoskeletal:  Negative for arthralgias and myalgias.  ?Skin:  Negative for color change, pallor, rash and wound.  ?Neurological:  Negative for seizures and headaches.  ?Psychiatric/Behavioral:  Negative for confusion and suicidal ideas.   ? ?Objective:  ?  ? ?  11/01/2021  ?  8:23 AM 09/16/2021  ?  8:55 AM 09/07/2021  ?  8:14 AM  ?Vitals with BMI  ?Height 5' 2" 5' 2" 5' 2"  ?Weight 157 lbs 157 lbs 156 lbs 1 oz  ?BMI 28.71 28.71 28.54  ?Systolic 998 338 250  ?Diastolic 74 82 72  ?Pulse 92 90 65  ? ? ?BP 118/74   Pulse 92   Ht 5' 2" (1.575 m)   Wt 157 lb (71.2 kg)   BMI 28.72 kg/m?   ?Wt Readings from  Last 3 Encounters:  ?11/01/21 157 lb (71.2 kg)  ?09/16/21 157 lb (71.2 kg)  ?09/07/21 156 lb 1.3 oz (70.8 kg)  ?  ? ?Physical Exam ?Constitutional:   ?   Appearance: She is well-developed.  ?HENT:  ?   Head: Normocephalic and atraumatic.  ?Neck:  ?   Thyroid: No thyromegaly.  ?   Trachea: No tracheal deviation.  ?Cardiovascular:  ?   Rate and Rhythm: Normal rate and regular rhythm.  ?Pulmonary:  ?   Effort: Pulmonary effort is normal.  ?Abdominal:  ?   Tenderness: There is no abdominal tenderness. There is no guarding.  ?Musculoskeletal:     ?   General: Normal range of motion.  ?   Cervical back: Normal range of motion and neck supple.  ?Skin: ?   General: Skin is warm and dry.  ?   Coloration: Skin is not pale.  ?   Findings: No erythema or rash.  ?Neurological:  ?   Mental Status: She is alert and oriented to person, place, and time.  ?   Cranial Nerves: No cranial nerve deficit.  ?   Coordination: Coordination normal.  ?   Deep Tendon Reflexes: Reflexes are normal and symmetric.  ?Psychiatric:     ?   Judgment: Judgment normal.  ?  ? ?CMP ( most recent) ?CMP  ?   ?Component Value Date/Time  ? NA 139 10/22/2021 1004  ? K 5.1 10/22/2021 1004  ? CL 106 10/22/2021 1004  ? CO2 20 10/22/2021 1004  ? GLUCOSE 200 (H) 10/22/2021 1004  ? GLUCOSE 152 (H) 09/16/2020 2043  ? BUN 20 10/22/2021 1004   ? CREATININE 0.85 10/22/2021 1004  ? CREATININE 0.66 05/19/2020 0750  ? CALCIUM 9.3 10/22/2021 1004  ? PROT 5.7 (L) 10/22/2021 1004  ? ALBUMIN 3.9 10/22/2021 1004  ? AST 28 10/22/2021 1004  ? ALT 53 (H) 03/24/

## 2021-11-17 ENCOUNTER — Ambulatory Visit: Payer: Commercial Managed Care - PPO | Admitting: "Endocrinology

## 2021-11-22 ENCOUNTER — Ambulatory Visit (INDEPENDENT_AMBULATORY_CARE_PROVIDER_SITE_OTHER): Payer: Commercial Managed Care - PPO

## 2021-11-22 DIAGNOSIS — Z111 Encounter for screening for respiratory tuberculosis: Secondary | ICD-10-CM

## 2021-11-24 ENCOUNTER — Ambulatory Visit: Payer: Commercial Managed Care - PPO | Admitting: "Endocrinology

## 2021-11-24 LAB — TB SKIN TEST
Induration: 0 mm
TB Skin Test: NEGATIVE

## 2021-11-24 NOTE — Addendum Note (Signed)
Addended by: Jasper Riling on: 11/24/2021 01:19 PM ? ? Modules accepted: Level of Service ? ?

## 2021-12-16 ENCOUNTER — Other Ambulatory Visit: Payer: Self-pay | Admitting: "Endocrinology

## 2022-01-02 NOTE — Progress Notes (Deleted)
Referring Provider:  Renee Rival, FNP Primary Care Physician:  Renee Rival, FNP Primary Gastroenterologist:  Dr. Gala Romney  No chief complaint on file.   HPI:   Anne Wright is a 46 y.o. female presenting today at the request of  Renee Rival, FNP for elevated liver enzymes.  Patient had mildly elevated ALT of 40 in October 2021.  In September 2022, AST 75, ALT 73.  February 2023 with AST 45, ALT 71.  Most recent labs 10/22/2021 with AST normalized, ALT 53.  Hepatitis C antibody negative.  Platelets within normal limits.   No abdominal imaging on file.  Hemoglobin A1c elevated at 9.3 on 11/01/2021.  Past Medical History:  Diagnosis Date   Diabetes mellitus without complication ([facility])    High cholesterol    Hypertension     No past surgical history on file.  Current Outpatient Medications  Medication Sig Dispense Refill   aspirin EC 81 MG tablet Take 81 mg by mouth daily.     atorvastatin (LIPITOR) 20 MG tablet TAKE 1 TABLET(20 MG) BY MOUTH AT BEDTIME 90 tablet 0   Blood Glucose Monitoring Suppl (ACCU-CHEK GUIDE ME) w/Device KIT 1 Piece by Does not apply route as directed. 1 kit 0   Cholecalciferol (VITAMIN D) 50 MCG (2000 UT) CAPS Take 2,000 Units by mouth daily with breakfast.     glipiZIDE (GLUCOTROL XL) 5 MG 24 hr tablet TAKE 1 TABLET(5 MG) BY MOUTH DAILY WITH BREAKFAST 90 tablet 0   glucose blood (ACCU-CHEK GUIDE) test strip Use as instructed 150 each 2   insulin glargine (LANTUS SOLOSTAR) 100 UNIT/ML Solostar Pen Inject 24 Units into the skin at bedtime. 15 mL 1   Insulin Pen Needle (BD PEN NEEDLE NANO U/F) 32G X 4 MM MISC Once daily 100 each 3   Lancets (FREESTYLE) lancets Test blod glucose four times daily. Use as instructed 100 each 12   lisinopril (ZESTRIL) 20 MG tablet TAKE 1 TABLET(20 MG) BY MOUTH DAILY 90 tablet 0   No current facility-administered medications for this visit.    Allergies as of 01/03/2022 - Review Complete 11/01/2021   Allergen Reaction Noted   Amoxicillin  02/07/2020   Shellfish allergy  01/06/2020    Family History  Problem Relation Age of Onset   Diabetes Mother    Hypertension Mother    Asthma Brother    Asthma Son    Breast cancer Maternal Grandmother    Colon cancer Neg Hx    Cancer - Lung Neg Hx    Cervical cancer Neg Hx     Social History   Socioeconomic History   Marital status: Legally Separated    Spouse name: Not on file   Number of children: 2   Years of education: Not on file   Highest education level: Not on file  Occupational History   Not on file  Tobacco Use   Smoking status: Former    Types: Cigarettes    Quit date: 06/29/2018    Years since quitting: 3.5   Smokeless tobacco: Never   Tobacco comments:    She smoked 10 cigarettes a day for 15 years , quit 2019.   Vaping Use   Vaping Use: Never used  Substance and Sexual Activity   Alcohol use: Not Currently    Comment: occa   Drug use: No   Sexual activity: Not Currently  Other Topics Concern   Not on file  Social History Narrative   Lives with her son.  Social Determinants of Health   Financial Resource Strain: Not on file  Food Insecurity: Not on file  Transportation Needs: Not on file  Physical Activity: Not on file  Stress: Not on file  Social Connections: Not on file  Intimate Partner Violence: Not on file    Review of Systems: Gen: Denies any fever, chills, cold or flulike symptoms, presyncope, syncope. CV: Denies chest pain, heart palpitations. Resp: Denies shortness of breath, cough. GI: See HPI GU : Denies urinary burning, urinary frequency, urinary hesitancy MS: Denies joint pain. Derm: Denies rash. Psych: Denies depression, anxiety. Heme: See HPI  Physical Exam: There were no vitals taken for this visit. General:   Alert and oriented. Pleasant and cooperative. Well-nourished and well-developed.  Head:  Normocephalic and atraumatic. Eyes:  Without icterus, sclera clear and  conjunctiva pink.  Ears:  Normal auditory acuity. Lungs:  Clear to auscultation bilaterally. No wheezes, rales, or rhonchi. No distress.  Heart:  S1, S2 present without murmurs appreciated.  Abdomen:  +BS, soft, non-tender and non-distended. No HSM noted. No guarding or rebound. No masses appreciated.  Rectal:  Deferred  Msk:  Symmetrical without gross deformities. Normal posture. Extremities:  Without edema. Neurologic:  Alert and  oriented x4;  grossly normal neurologically. Skin:  Intact without significant lesions or rashes. Psych:  Normal mood and affect.    Assessment:     Plan:  ***   Aliene Altes, PA-C [facility] 01/03/2022

## 2022-01-03 ENCOUNTER — Ambulatory Visit: Payer: Commercial Managed Care - PPO | Admitting: Gastroenterology

## 2022-01-05 ENCOUNTER — Encounter: Payer: Commercial Managed Care - PPO | Admitting: Nurse Practitioner

## 2022-01-14 ENCOUNTER — Ambulatory Visit: Payer: Commercial Managed Care - PPO | Admitting: Nurse Practitioner

## 2022-01-26 ENCOUNTER — Encounter: Payer: Self-pay | Admitting: Nurse Practitioner

## 2022-01-26 ENCOUNTER — Ambulatory Visit (INDEPENDENT_AMBULATORY_CARE_PROVIDER_SITE_OTHER): Payer: 59 | Admitting: Nurse Practitioner

## 2022-01-26 VITALS — BP 142/82 | HR 88 | Ht 62.0 in | Wt 153.0 lb

## 2022-01-26 DIAGNOSIS — E782 Mixed hyperlipidemia: Secondary | ICD-10-CM

## 2022-01-26 DIAGNOSIS — I10 Essential (primary) hypertension: Secondary | ICD-10-CM

## 2022-01-26 DIAGNOSIS — R748 Abnormal levels of other serum enzymes: Secondary | ICD-10-CM | POA: Diagnosis not present

## 2022-01-26 DIAGNOSIS — F419 Anxiety disorder, unspecified: Secondary | ICD-10-CM

## 2022-01-26 DIAGNOSIS — E1165 Type 2 diabetes mellitus with hyperglycemia: Secondary | ICD-10-CM

## 2022-01-26 MED ORDER — ESCITALOPRAM OXALATE 10 MG PO TABS: 10.0000 mg | ORAL_TABLET | Freq: Every day | ORAL | 0 refills | Status: DC

## 2022-01-26 MED ORDER — ATORVASTATIN CALCIUM 20 MG PO TABS: ORAL_TABLET | ORAL | 0 refills | Status: DC

## 2022-01-26 NOTE — Assessment & Plan Note (Addendum)
  Denies abdominal pain does not take Tylenol does not drink alcohol She has not seen GI yet, missed her appointment  Recheck CMP if lab remains abnormal will encourage patient to follow-up with GI

## 2022-01-26 NOTE — Assessment & Plan Note (Addendum)
Atorvastatin 20 mg daily check lipid panel Avoid fried fatty foods

## 2022-01-26 NOTE — Assessment & Plan Note (Signed)
BP Readings from Last 3 Encounters:  01/26/22 (!) 142/82  11/01/21 118/74  09/16/21 122/82  Currently on lisinopril 20 mg daily BP elevated in the office today, she walked last night and has not had a chance to sleep before coming for her visit today. No changes made to medication today her previous blood pressure reading is within normal. DASH diet advised patient encouraged to get adequate rest, engage in regular moderate exercises at least for 50 minutes weekly Follow-up in 6 weeks Monitor blood pressure at least 3 times weekly goal is BP of less than 130/80

## 2022-01-26 NOTE — Patient Instructions (Signed)
Please monitor your blood pressure at home ,keep a log and bring to your next follow up in 6 weeks . Blood pressure goal is less than 130/80.   Please start taking lexapro 10mg  daily for anxiety .   It is important that you exercise regularly at least 30 minutes 5 times a week.  Think about what you will eat, plan ahead. Choose " clean, green, fresh or frozen" over canned, processed or packaged foods which are more sugary, salty and fatty. 70 to 75% of food eaten should be vegetables and fruit. Three meals at set times with snacks allowed between meals, but they must be fruit or vegetables. Aim to eat over a 12 hour period , example 7 am to 7 pm, and STOP after  your last meal of the day. Drink water,generally about 64 ounces per day, no other drink is as healthy. Fruit juice is best enjoyed in a healthy way, by EATING the fruit.  Thanks for choosing [facility], we consider it a privelige to serve you.

## 2022-01-26 NOTE — Progress Notes (Signed)
Anne Wright     MRN: [id number]      DOB: 02/21/1976   HPI Anne Wright with past medical history of uncontrolled type 2 diabetes, essential hypertension, mixed hyperlipidemia, vitamin D deficiency is here for follow up and re-evaluation of chronic medical conditions, medication management    Hypertension .currently taking lisinopril 20 mg daily , patient reports that she has been taking her medications daily .  Blood pressure is elevated in the office today, patient stated that she worked last night and has not had a chance to sleep before coming for her visit today, eat some pizza for breakfast this morning.  She denies chest pain, dizziness, edema   Uncontrolled type 2 diabetes . she sometimes check her blood sugars in the mornings, states that fasting sugar is sometimes 120's. Sometimes has hypoglycemia about 1-2 times a month.  She is followed by endocrinologist.  On glipizide 5 mg daily, Lantus 24 units at bedtime  Anxiety. Has been having anxiety for months, anxiety now worse since she has been in school, studying  nursing, denies SI, HI, depression   ROS Denies recent fever or chills. Denies sinus pressure, nasal congestion, ear pain or sore throat. Denies chest congestion, productive cough or wheezing. Denies chest pains, palpitations and leg swelling Denies abdominal pain, nausea, vomiting,diarrhea or constipation.   Denies dysuria, frequency, hesitancy or incontinence. Denies joint pain, swelling and limitation in mobility. Denies headaches, seizures, numbness, or tingling.    PE  BP (!) 142/82   Pulse 88   Ht 5\' 2"  (1.575 m)   Wt 153 lb (69.4 kg)   LMP 01/23/2022 (Approximate)   SpO2 98%   BMI 27.98 kg/m   Patient alert and oriented and in no cardiopulmonary distress.  Chest: Clear to auscultation bilaterally.  CVS: S1, S2 no murmurs, no S3.Regular rate.  ABD: Soft non tender.   Ext: No edema  MS: Adequate ROM spine, shoulders, hips and  knees.  Psych: Good eye contact, normal affect. Memory intact not anxious or depressed appearing.  CNS: CN 2-12 intact, power,  normal throughout.no focal deficits noted.   Assessment & Plan  Essential hypertension, benign BP Readings from Last 3 Encounters:  01/26/22 (!) 142/82  11/01/21 118/74  09/16/21 122/82  Currently on lisinopril 20 mg daily BP elevated in the office today, she walked last night and has not had a chance to sleep before coming for her visit today. No changes made to medication today her previous blood pressure reading is within normal. DASH diet advised patient encouraged to get adequate rest, engage in regular moderate exercises at least for 50 minutes weekly Follow-up in 6 weeks Monitor blood pressure at least 3 times weekly goal is BP of less than 130/80  Uncontrolled type 2 diabetes mellitus with hyperglycemia ([facility]) Lab Results  Component Value Date   HGBA1C 9.3 (A) 11/01/2021  Uncontrolled condition On Lantus 24 units daily at bedtime, glipizide 5 mg daily States that she has had eye exam done we will get records from her ophthalmologist. Avoid sugar sweets SODA Urine creatinine labs today Patient encouraged to maintain close follow-up with Dr. 01/01/2022 she verbalized understanding  Mixed hyperlipidemia Atorvastatin 20 mg daily check lipid panel Avoid fried fatty foods  Anxiety GAD-7 score 19 Start Lexapro 10 mg daily Need to take medication daily as ordered discussed with patient Follow-up in 6 weeks Side effects of medication discussed  Elevated liver enzymes  Denies abdominal pain does not take Tylenol does not drink alcohol She  has not seen GI yet, missed her appointment  Recheck CMP if lab remains abnormal will encourage patient to follow-up with GI

## 2022-01-26 NOTE — Assessment & Plan Note (Signed)
GAD-7 score 19 Start Lexapro 10 mg daily Need to take medication daily as ordered discussed with patient Follow-up in 6 weeks Side effects of medication discussed

## 2022-01-26 NOTE — Assessment & Plan Note (Addendum)
Lab Results  Component Value Date   HGBA1C 9.3 (A) 11/01/2021  Uncontrolled condition On Lantus 24 units daily at bedtime, glipizide 5 mg daily States that she has had eye exam done we will get records from her ophthalmologist. Rarely has hypoglycemia  Avoid sugar sweets SODA Urine creatinine labs today Patient encouraged to maintain close follow-up with Dr. Fransico Him she verbalized understanding

## 2022-02-08 DIAGNOSIS — I10 Essential (primary) hypertension: Secondary | ICD-10-CM | POA: Diagnosis not present

## 2022-02-08 DIAGNOSIS — E782 Mixed hyperlipidemia: Secondary | ICD-10-CM | POA: Diagnosis not present

## 2022-02-08 DIAGNOSIS — E1165 Type 2 diabetes mellitus with hyperglycemia: Secondary | ICD-10-CM | POA: Diagnosis not present

## 2022-02-10 ENCOUNTER — Other Ambulatory Visit: Payer: Self-pay | Admitting: Nurse Practitioner

## 2022-02-10 ENCOUNTER — Encounter: Payer: Self-pay | Admitting: *Deleted

## 2022-02-10 DIAGNOSIS — R748 Abnormal levels of other serum enzymes: Secondary | ICD-10-CM

## 2022-02-10 LAB — CMP14+EGFR
ALT: 49 IU/L — ABNORMAL HIGH (ref 0–32)
AST: 31 IU/L (ref 0–40)
Albumin/Globulin Ratio: 1.7 (ref 1.2–2.2)
Albumin: 4 g/dL (ref 3.9–4.9)
Alkaline Phosphatase: 74 IU/L (ref 44–121)
BUN/Creatinine Ratio: 30 — ABNORMAL HIGH (ref 9–23)
BUN: 26 mg/dL — ABNORMAL HIGH (ref 6–24)
Bilirubin Total: <0.2 mg/dL (ref 0.0–1.2)
CO2: 19 mmol/L — ABNORMAL LOW (ref 20–29)
Calcium: 9.2 mg/dL (ref 8.7–10.2)
Chloride: 110 mmol/L — ABNORMAL HIGH (ref 96–106)
Creatinine, Ser: 0.88 mg/dL (ref 0.57–1.00)
Globulin, Total: 2.3 g/dL (ref 1.5–4.5)
Glucose: 165 mg/dL — ABNORMAL HIGH (ref 70–99)
Potassium: 4.5 mmol/L (ref 3.5–5.2)
Sodium: 144 mmol/L (ref 134–144)
Total Protein: 6.3 g/dL (ref 6.0–8.5)
eGFR: 84 mL/min/{1.73_m2} (ref >59)

## 2022-02-10 LAB — LIPID PANEL
Chol/HDL Ratio: 3.2 ratio (ref 0.0–4.4)
Cholesterol, Total: 155 mg/dL (ref 100–199)
HDL: 49 mg/dL (ref >39)
LDL Chol Calc (NIH): 70 mg/dL (ref 0–99)
Triglycerides: 221 mg/dL — ABNORMAL HIGH (ref 0–149)
VLDL Cholesterol Cal: 36 mg/dL (ref 5–40)

## 2022-02-10 LAB — MICROALBUMIN / CREATININE URINE RATIO
Creatinine, Urine: 153.2 mg/dL
Microalb/Creat Ratio: 88 mg/g creat — ABNORMAL HIGH (ref 0–29)
Microalbumin, Urine: 134.3 ug/mL

## 2022-02-10 NOTE — Progress Notes (Signed)
LDL is normal but triglycerides is high pt should avoid refined sugar, sugary drinks, alcohol, high calorie foods.  ALT remains elevated but improved from 3 months ago, avoid alcohol, tylenol, pt should follow up with GI as planned.   Maintain close follow up with endo , she is spilling protein in her urine and we need to get her diabetes under control to avoid kidney damage.

## 2022-03-02 ENCOUNTER — Ambulatory Visit (INDEPENDENT_AMBULATORY_CARE_PROVIDER_SITE_OTHER): Payer: 59 | Admitting: Internal Medicine

## 2022-03-02 ENCOUNTER — Encounter: Payer: Self-pay | Admitting: Internal Medicine

## 2022-03-02 VITALS — BP 100/69 | HR 89 | Temp 98.3°F | Ht 62.0 in | Wt 152.9 lb

## 2022-03-02 DIAGNOSIS — K219 Gastro-esophageal reflux disease without esophagitis: Secondary | ICD-10-CM

## 2022-03-02 DIAGNOSIS — R7989 Other specified abnormal findings of blood chemistry: Secondary | ICD-10-CM

## 2022-03-02 DIAGNOSIS — R1319 Other dysphagia: Secondary | ICD-10-CM | POA: Diagnosis not present

## 2022-03-02 MED ORDER — FAMOTIDINE 20 MG PO TABS: 20.0000 mg | ORAL_TABLET | Freq: Two times a day (BID) | ORAL | 11 refills | Status: DC

## 2022-03-02 NOTE — Progress Notes (Signed)
Primary Care Physician:  Donell Beers, FNP Primary Gastroenterologist:  Dr. Marletta Lor  Chief Complaint  Patient presents with   Elevated Hepatic Enzymes    New patient. Referred for elevated liver enzymes.     HPI:   Anne Wright is a 46 y.o. female who presents to the clinic today by referral from her PCP Edwin Dada for evaluation.  Routine blood work showed slightly abnormal liver function test.    09/07/2021 AST 45, ALT 71, T. bili 0.2, alk phos 72 10/22/2021 AST 28, ALT 53, T. bili 0.3, alk phos 71 02/08/2022 AST 31, ALT 49, T. bili 0.2, alk phos 74  Patient denies any chronic alcohol use.  Does state she used to drink alcohol previously.  No family history of liver disease.  No new medications.  No family history of autoimmune disorders.  No exposure to viral hepatitis that she is aware of.  Does has risk factors for fatty liver disease including diabetes and dyslipidemia.  BMI 28.  Does note chronic acid reflux.  Notes heartburn daily.  Occasional dysphagia with food getting stuck though this is intermittent.  Does not currently take any medications for this.  No melena hematochezia.  No abdominal pain.  No unintentional weight loss.  No family history of colorectal malignancy.  Past Medical History:  Diagnosis Date   Diabetes mellitus without complication (HCC)    High cholesterol    Hypertension     History reviewed. No pertinent surgical history.  Current Outpatient Medications  Medication Sig Dispense Refill   aspirin EC 81 MG tablet Take 81 mg by mouth daily.     atorvastatin (LIPITOR) 20 MG tablet Take one tablet daily 90 tablet 0   Blood Glucose Monitoring Suppl (ACCU-CHEK GUIDE ME) w/Device KIT 1 Piece by Does not apply route as directed. 1 kit 0   escitalopram (LEXAPRO) 10 MG tablet Take 1 tablet (10 mg total) by mouth daily. 90 tablet 0   famotidine (PEPCID) 20 MG tablet Take 1 tablet (20 mg total) by mouth 2 (two) times daily. 60 tablet 11    glipiZIDE (GLUCOTROL XL) 5 MG 24 hr tablet TAKE 1 TABLET(5 MG) BY MOUTH DAILY WITH BREAKFAST 90 tablet 0   glucose blood (ACCU-CHEK GUIDE) test strip Use as instructed 150 each 2   insulin glargine (LANTUS SOLOSTAR) 100 UNIT/ML Solostar Pen Inject 24 Units into the skin at bedtime. 15 mL 1   Insulin Pen Needle (BD PEN NEEDLE NANO U/F) 32G X 4 MM MISC Once daily 100 each 3   Lancets (FREESTYLE) lancets Test blod glucose four times daily. Use as instructed 100 each 12   lisinopril (ZESTRIL) 20 MG tablet TAKE 1 TABLET(20 MG) BY MOUTH DAILY 90 tablet 0   No current facility-administered medications for this visit.    Allergies as of 03/02/2022 - Review Complete 03/02/2022  Allergen Reaction Noted   Amoxicillin  02/07/2020   Shellfish allergy  01/06/2020    Family History  Problem Relation Age of Onset   Diabetes Mother    Hypertension Mother    Asthma Brother    Breast cancer Maternal Grandmother    Asthma Son    Colon cancer Neg Hx    Cancer - Lung Neg Hx    Cervical cancer Neg Hx     Social History   Socioeconomic History   Marital status: Married    Spouse name: Not on file   Number of children: 2   Years of education: Not on  file   Highest education level: Not on file  Occupational History   Not on file  Tobacco Use   Smoking status: Former    Types: Cigarettes    Quit date: 06/29/2018    Years since quitting: 3.6    Passive exposure: Past   Smokeless tobacco: Never   Tobacco comments:    She smoked 10 cigarettes a day for 15 years , quit 2019.   Vaping Use   Vaping Use: Never used  Substance and Sexual Activity   Alcohol use: Not Currently    Comment: occa   Drug use: No   Sexual activity: Not Currently  Other Topics Concern   Not on file  Social History Narrative   Lives with her son.    Social Determinants of Health   Financial Resource Strain: Not on file  Food Insecurity: Not on file  Transportation Needs: Not on file  Physical Activity: Not on  file  Stress: Not on file  Social Connections: Not on file  Intimate Partner Violence: Not on file    Subjective: Review of Systems  Constitutional:  Negative for chills and fever.  HENT:  Negative for congestion and hearing loss.   Eyes:  Negative for blurred vision and double vision.  Respiratory:  Negative for cough and shortness of breath.   Cardiovascular:  Negative for chest pain and palpitations.  Gastrointestinal:  Positive for heartburn. Negative for abdominal pain, blood in stool, constipation, diarrhea, melena and vomiting.  Genitourinary:  Negative for dysuria and urgency.  Musculoskeletal:  Negative for joint pain and myalgias.  Skin:  Negative for itching and rash.  Neurological:  Negative for dizziness and headaches.  Psychiatric/Behavioral:  Negative for depression. The patient is not nervous/anxious.        Objective: BP 100/69 (BP Location: Left Arm, Patient Position: Sitting, Cuff Size: Normal)   Pulse 89   Temp 98.3 F (36.8 C) (Oral)   Ht $R'5\' 2"'ku$  (1.575 m)   Wt 152 lb 14.4 oz (69.4 kg)   BMI 27.97 kg/m  Physical Exam Constitutional:      Appearance: Normal appearance.  HENT:     Head: Normocephalic and atraumatic.  Eyes:     Extraocular Movements: Extraocular movements intact.     Conjunctiva/sclera: Conjunctivae normal.  Cardiovascular:     Rate and Rhythm: Normal rate and regular rhythm.  Pulmonary:     Effort: Pulmonary effort is normal.     Breath sounds: Normal breath sounds.  Abdominal:     General: Bowel sounds are normal.     Palpations: Abdomen is soft.  Musculoskeletal:        General: No swelling. Normal range of motion.     Cervical back: Normal range of motion and neck supple.  Skin:    General: Skin is warm and dry.     Coloration: Skin is not jaundiced.  Neurological:     General: No focal deficit present.     Mental Status: She is alert and oriented to person, place, and time.  Psychiatric:        Mood and Affect: Mood  normal.        Behavior: Behavior normal.      Assessment: *Chronic GERD *Esophageal dysphagia-intermittent *Abnormal aminotransferases  Plan: Patient with daily heartburn, intermittent dysphagia.  Will start on famotidine 20 mg twice daily.  Patient to call in 4 to 6 weeks if not improved will trial on PPI.  May need endoscopic evaluation pending clinical course.  Discussed  abnormal liver tests in depth with patient today.  Likely component of fatty liver disease given her underlying diabetes and dyslipidemia.  We will check ultrasound with elastography.  Blood work to rule out viral hepatitis, autoimmune hepatitis, iron disorders.  Colonoscopy for colon cancer screening due at age 23.  Patient is agreeable.  Follow-up in 6 months.  Thank you for Vena Rua for the kind referral.  03/02/2022 9:22 AM   Disclaimer: This note was dictated with voice recognition software. Similar sounding words can inadvertently be transcribed and may not be corrected upon review.

## 2022-03-02 NOTE — Patient Instructions (Signed)
Etiology of your slightly abnormal liver tests likely due to fatty liver disease.  I will order ultrasound with elastography to further evaluate.  I am also going to check blood work at [facility] to rule out other causes including hepatitis B, autoimmune hepatitis, iron disorders.  For your chronic reflux, I will send in famotidine 20 mg twice daily.  Let us know in 4 to 6 weeks if your symptoms are not improved and I will send in a different medication.  Otherwise follow-up in 6 months.  It was very nice meeting you today.  Happy early birthday!  Dr. Marletta Lor  Nonalcoholic Fatty Liver Disease Diet, Adult Nonalcoholic fatty liver disease is a condition that causes fat to build up in and around the liver. The disease makes it harder for the liver to work the way that it should. Following a healthy diet can help to keep nonalcoholic fatty liver disease under control. It can also help to prevent or improve conditions that are associated with the disease, such as heart disease, diabetes, high blood pressure, and abnormal cholesterol levels. Along with regular exercise, this diet: Promotes weight loss. Helps to control blood sugar levels. Helps to improve the way that the body uses insulin. What are tips for following this plan? Reading food labels  Always check food labels for: The amount of saturated fat in a food. You should limit your intake of saturated fat. Saturated fat is found in foods that come from animals, including meat and dairy products such as butter, cheese, and whole milk. The amount of fiber in a food. You should choose high-fiber foods such as fruits, vegetables, and whole grains. Try to get 25-30 grams (g) of fiber a day.   Cooking When cooking, use heart-healthy oils that are high in monounsaturated fats. These include olive oil, canola oil, and avocado oil. Limit frying or deep-frying foods. Cook foods using healthy methods such as baking, boiling, steaming, and grilling  instead. Meal planning You may want to keep track of how many calories you take in. Eating the right amount of calories will help you achieve a healthy weight. Meeting with a registered dietitian can help you get started. Limit how often you eat takeout and fast food. These foods are usually very high in fat, salt, and sugar. Use the glycemic index (GI) to plan your meals. The index tells you how quickly a food will raise your blood sugar. Choose low-GI foods (GI less than 55). These foods take a longer time to raise blood sugar. A registered dietitian can help you identify foods lower on the GI scale. Lifestyle You may want to follow a Mediterranean diet. This diet includes a lot of vegetables, lean meats or fish, whole grains, fruits, and healthy oils and fats. What foods can I eat?    Fruits Bananas. Apples. Oranges. Grapes. Papaya. Mango. Pomegranate. Kiwi. Grapefruit. Cherries. Vegetables Lettuce. Spinach. Peas. Beets. Cauliflower. Cabbage. Broccoli. Carrots. Tomatoes. Squash. Eggplant. Herbs. Peppers. Onions. Cucumbers. Brussels sprouts. Yams and sweet potatoes. Beans. Lentils. Grains Whole wheat or whole-grain foods, including breads, crackers, cereals, and pasta. Stone-ground whole wheat. Unsweetened oatmeal. Bulgur. Barley. Quinoa. Brown or wild rice. Corn or whole wheat flour tortillas. Meats and other proteins Lean meats. Poultry. Tofu. Seafood and shellfish. Dairy Low-fat or fat-free dairy products, such as yogurt, cottage cheese, or cheese. Beverages Water. Sugar-free drinks. Tea. Coffee. Low-fat or skim milk. Milk alternatives, such as soy or almond milk. Real fruit juice. Fats and oils Avocado. Canola or olive oil. Nuts  and nut butters. Seeds. Seasonings and condiments Mustard. Relish. Low-fat, low-sugar ketchup and barbecue sauce. Low-fat or fat-free mayonnaise. Sweets and desserts Sugar-free sweets. The items listed above may not be a complete list of foods and beverages  you can eat. Contact a dietitian for more information. What foods should I limit or avoid? Meats and other proteins Limit red meat to 1-2 times a week. Dairy [employer]. Fats and oils Palm oil and coconut oil. Fried foods. Other foods Processed foods. Foods that contain a lot of salt or sodium. Sweets and desserts Sweets that contain sugar. Beverages Sweetened drinks, such as sweet tea, milkshakes, iced sweet drinks, and sodas. Alcohol. The items listed above may not be a complete list of foods and beverages you should avoid. Contact a dietitian for more information. Where to find more information The [employer] of Diabetes and Digestive and Kidney Diseases: [employer] Summary Nonalcoholic fatty liver disease is a condition that causes fat to build up in and around the liver. Following a healthy diet can help to keep nonalcoholic fatty liver disease under control. Your diet should be rich in fruits, vegetables, whole grains, and lean proteins. Limit your intake of saturated fat. Saturated fat is found in foods that come from animals, including meat and dairy products such as butter, cheese, and whole milk. This diet promotes weight loss, helps to control blood sugar levels, and helps to improve the way that the body uses insulin. This information is not intended to replace advice given to you by your health care provider. Make sure you discuss any questions you have with your health care provider. Document Revised: 11/09/2018 Document Reviewed: 08/09/2018 Elsevier Patient Education  [place].

## 2022-03-03 ENCOUNTER — Encounter: Payer: Self-pay | Admitting: *Deleted

## 2022-03-06 LAB — IRON,TIBC AND FERRITIN PANEL
%SAT: 16 % (calc) (ref 16–45)
Ferritin: 23 ng/mL (ref 16–232)
Iron: 60 ug/dL (ref 40–190)
TIBC: 371 mcg/dL (calc) (ref 250–450)

## 2022-03-06 LAB — HEPATITIS B SURFACE ANTIBODY,QUALITATIVE: Hep B S Ab: REACTIVE — AB

## 2022-03-06 LAB — HEPATITIS A ANTIBODY, TOTAL: Hepatitis A AB,Total: NONREACTIVE

## 2022-03-06 LAB — HEPATITIS B SURFACE ANTIGEN: Hepatitis B Surface Ag: NONREACTIVE

## 2022-03-06 LAB — ANTI-SMOOTH MUSCLE ANTIBODY, IGG: Actin (Smooth Muscle) Antibody (IGG): <20 U (ref <20)

## 2022-03-06 LAB — HEPATITIS B CORE ANTIBODY, TOTAL: Hep B Core Total Ab: NONREACTIVE

## 2022-03-06 LAB — ANA: Anti Nuclear Antibody (ANA): NEGATIVE

## 2022-03-09 ENCOUNTER — Other Ambulatory Visit: Payer: Self-pay | Admitting: *Deleted

## 2022-03-09 ENCOUNTER — Encounter: Payer: Self-pay | Admitting: *Deleted

## 2022-03-09 DIAGNOSIS — Z1239 Encounter for other screening for malignant neoplasm of breast: Secondary | ICD-10-CM | POA: Diagnosis not present

## 2022-03-09 DIAGNOSIS — N898 Other specified noninflammatory disorders of vagina: Secondary | ICD-10-CM | POA: Diagnosis not present

## 2022-03-09 DIAGNOSIS — Z6828 Body mass index (BMI) 28.0-28.9, adult: Secondary | ICD-10-CM | POA: Diagnosis not present

## 2022-03-09 DIAGNOSIS — R7989 Other specified abnormal findings of blood chemistry: Secondary | ICD-10-CM

## 2022-03-09 DIAGNOSIS — Z01419 Encounter for gynecological examination (general) (routine) without abnormal findings: Secondary | ICD-10-CM | POA: Diagnosis not present

## 2022-03-09 DIAGNOSIS — Z124 Encounter for screening for malignant neoplasm of cervix: Secondary | ICD-10-CM | POA: Diagnosis not present

## 2022-03-09 DIAGNOSIS — N951 Menopausal and female climacteric states: Secondary | ICD-10-CM | POA: Diagnosis not present

## 2022-03-10 ENCOUNTER — Encounter: Payer: Self-pay | Admitting: Nurse Practitioner

## 2022-03-10 ENCOUNTER — Ambulatory Visit: Payer: 59 | Admitting: Nurse Practitioner

## 2022-03-10 VITALS — BP 102/68 | HR 96 | Ht 62.0 in | Wt 154.0 lb

## 2022-03-10 DIAGNOSIS — F419 Anxiety disorder, unspecified: Secondary | ICD-10-CM | POA: Diagnosis not present

## 2022-03-10 DIAGNOSIS — R42 Dizziness and giddiness: Secondary | ICD-10-CM | POA: Diagnosis not present

## 2022-03-10 DIAGNOSIS — I10 Essential (primary) hypertension: Secondary | ICD-10-CM

## 2022-03-10 DIAGNOSIS — F32A Depression, unspecified: Secondary | ICD-10-CM

## 2022-03-10 MED ORDER — BUSPIRONE HCL 5 MG PO TABS: 5.0000 mg | ORAL_TABLET | Freq: Two times a day (BID) | ORAL | 3 refills | Status: DC

## 2022-03-10 MED ORDER — LISINOPRIL 10 MG PO TABS: 10.0000 mg | ORAL_TABLET | Freq: Every day | ORAL | 3 refills | Status: DC

## 2022-03-10 MED ORDER — ATORVASTATIN CALCIUM 20 MG PO TABS
20.0000 mg | ORAL_TABLET | Freq: Every day | ORAL | 1 refills | Status: DC
Start: 2022-03-10 — End: 2022-05-11
  Filled 2022-03-28: qty 90, 90d supply, fill #0

## 2022-03-10 NOTE — Assessment & Plan Note (Signed)
She stopped taking Lexapro due to diarrhea Start buspirone 5 mg twice daily Follow-up in 6 weeks Denies SI, HI

## 2022-03-10 NOTE — Assessment & Plan Note (Addendum)
BP Readings from Last 3 Encounters:  03/10/22 102/68  03/02/22 100/69  01/26/22 (!) 142/82  Chronic condition well-controlled on lisinopril 20 mg daily but reports chronic  dizziness  Blood pressure is 102/68 in the office today reported that the last time she took her lisinopril was yesterday morning Decreased dose of lisinopril to 10 mg daily to see if this will help with her dizziness Patient told to monitor blood pressure daily at home call the office if her blood pressure consistently stays greater than 130/80 DASH diet advised engage in regular daily exercises at least 150 minutes weekly Follow-up in 6 weeks

## 2022-03-10 NOTE — Assessment & Plan Note (Signed)
Chronic condition Dose of lisinopril decreased to 10 mg daily from 20 mg daily to see if this will resolve her dizziness Also has uncontrolled diabetes this could be contributing to her dizziness as well. Patient encouraged to maintain close follow-up with endocrinologist, need to get her diabetes under control discussed

## 2022-03-10 NOTE — Patient Instructions (Addendum)
Please start taking buspirone 5mg  twice daily for your anxiety.   Start lisinopril 10mg  daily , monitor your blood pressure daily at home blood pressure goal is less than 130/80, please keep a log and bring to your next appointment    It is important that you exercise regularly at least 30 minutes 5 times a week.  Think about what you will eat, plan ahead. Choose " clean, green, fresh or frozen" over canned, processed or packaged foods which are more sugary, salty and fatty. 70 to 75% of food eaten should be vegetables and fruit. Three meals at set times with snacks allowed between meals, but they must be fruit or vegetables. Aim to eat over a 12 hour period , example 7 am to 7 pm, and STOP after  your last meal of the day. Drink water,generally about 64 ounces per day, no other drink is as healthy. Fruit juice is best enjoyed in a healthy way, by EATING the fruit.  Thanks for choosing [facility], we consider it a privelige to serve you.

## 2022-03-10 NOTE — Assessment & Plan Note (Addendum)
She stopped taking lexapro because it gave her diarrhea Start buspirone 5 mg twice daily Denies SI HI Follow-up in 6 weeks

## 2022-03-10 NOTE — Progress Notes (Signed)
   Anne Wright     MRN: [id number]      DOB: January 09, 1976   HPI Anne Wright with past medical history of uncontrolled type 2 diabetes, hypertension, anxiety and depression, hyperlipidemia is here for follow up for anxiety  Anxiety.  She has stopped taking Lexapro due to diarrhea, states that she took buspirone in the past she did well on it.  Willing to start buspirone today.  She denies SI, HI   Patient complains of dizziness with blurry vision that she has had for about a year now, happens  every other day usually when she is at work.  She usually sits down when she feels dizzy and the dizziness goes away after some minutes.  She denies headache, numbness, tingling, seizures, chest pain She had her eye exam last year she is going to make an appointment to get her diabetic eye exam done exam    Goes for [place] of her abdomen on the 15th of this month    ROS Denies recent fever or chills. Denies sinus pressure, nasal congestion, ear pain or sore throat. Denies chest congestion, productive cough or wheezing. Denies chest pains, palpitations and leg swelling Denies abdominal pain, nausea, vomiting,diarrhea or constipation.   Denies dysuria, frequency, hesitancy or incontinence. Denies joint pain, swelling and limitation in mobility. Denies headaches, seizures, numbness, or tingling.    PE  BP 102/68 (BP Location: Right Arm, Patient Position: Sitting, Cuff Size: Normal)   Pulse 96   Ht 5\' 2"  (1.575 m)   Wt 154 lb (69.9 kg)   LMP 02/17/2022 (Approximate)   SpO2 98%   BMI 28.17 kg/m   Patient alert and oriented and in no cardiopulmonary distress.  HEENT: No facial asymmetry, EOMI,     Neck supple .  Chest: Clear to auscultation bilaterally.  CVS: S1, S2 no murmurs, no S3.Regular rate.  ABD: Soft non tender.   Ext: No edema  MS: Adequate ROM spine, shoulders, hips and knees.  Psych: Good eye contact, normal affect. Memory intact not anxious or depressed  appearing.  CNS: CN 2-12 intact, power,  normal throughout.no focal deficits noted.   Assessment & Plan  Depressive disorder She stopped taking lexapro because it gave her diarrhea Start buspirone 5 mg twice daily Denies SI HI Follow-up in 6 weeks  Essential hypertension, benign BP Readings from Last 3 Encounters:  03/10/22 102/68  03/02/22 100/69  01/26/22 (!) 142/82  Chronic condition well-controlled on lisinopril 20 mg daily but reports chronic  dizziness  Blood pressure is 102/68 in the office today reported that the last time she took her lisinopril was yesterday morning Decreased dose of lisinopril to 10 mg daily to see if this will help with her dizziness Patient told to monitor blood pressure daily at home call the office if her blood pressure consistently stays greater than 130/80 DASH diet advised engage in regular daily exercises at least 150 minutes weekly Follow-up in 6 weeks   Anxiety She stopped taking Lexapro due to diarrhea Start buspirone 5 mg twice daily Follow-up in 6 weeks Denies SI, HI  Dizziness Chronic condition Dose of lisinopril decreased to 10 mg daily from 20 mg daily to see if this will resolve her dizziness Also has uncontrolled diabetes this could be contributing to her dizziness as well. Patient encouraged to maintain close follow-up with endocrinologist, need to get her diabetes under control discussed

## 2022-03-15 ENCOUNTER — Ambulatory Visit (HOSPITAL_COMMUNITY)
Admission: RE | Admit: 2022-03-15 | Discharge: 2022-03-15 | Disposition: A | Discharge status: Home / Self Care | Payer: 59 | Source: Ambulatory Visit | Attending: Internal Medicine | Admitting: Internal Medicine

## 2022-03-15 DIAGNOSIS — R7989 Other specified abnormal findings of blood chemistry: Secondary | ICD-10-CM | POA: Diagnosis not present

## 2022-03-15 DIAGNOSIS — E119 Type 2 diabetes mellitus without complications: Secondary | ICD-10-CM | POA: Diagnosis not present

## 2022-03-15 DIAGNOSIS — I10 Essential (primary) hypertension: Secondary | ICD-10-CM | POA: Diagnosis not present

## 2022-03-15 IMAGING — MG MM DIGITAL DIAGNOSTIC UNILAT*L* W/ TOMO W/ CAD
4 series · 4 of 12 positions shown · non-contrast
Comparison: Screening mammography November 14, 2019

CLINICAL DATA: The patient was called back for a left breast
asymmetry.

EXAM:
DIGITAL DIAGNOSTIC UNILATERAL LEFT MAMMOGRAM WITH CAD AND TOMO

[L MLO synth-2D]
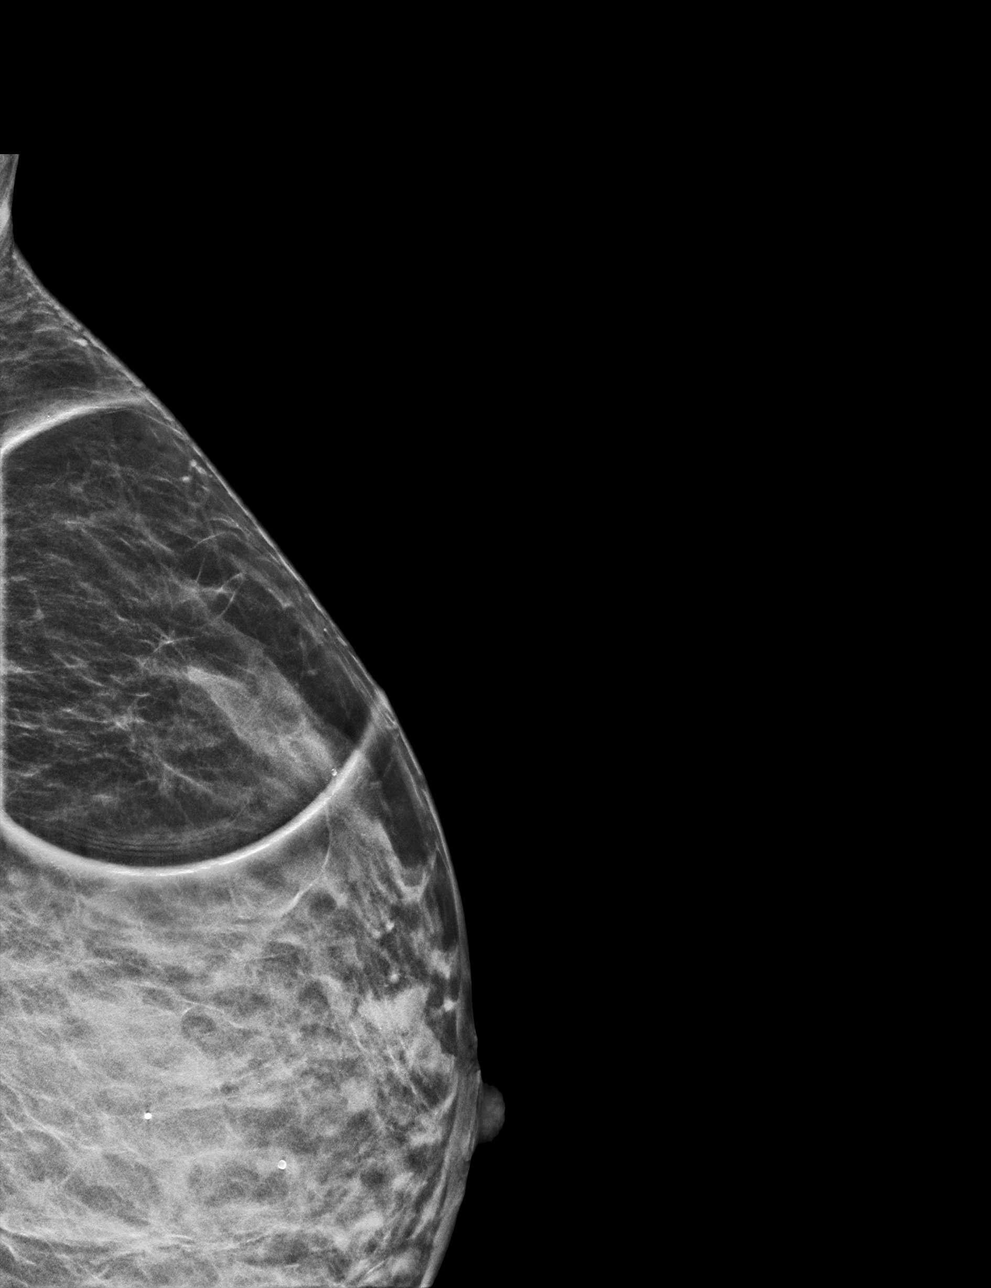

[L ML synth-2D]
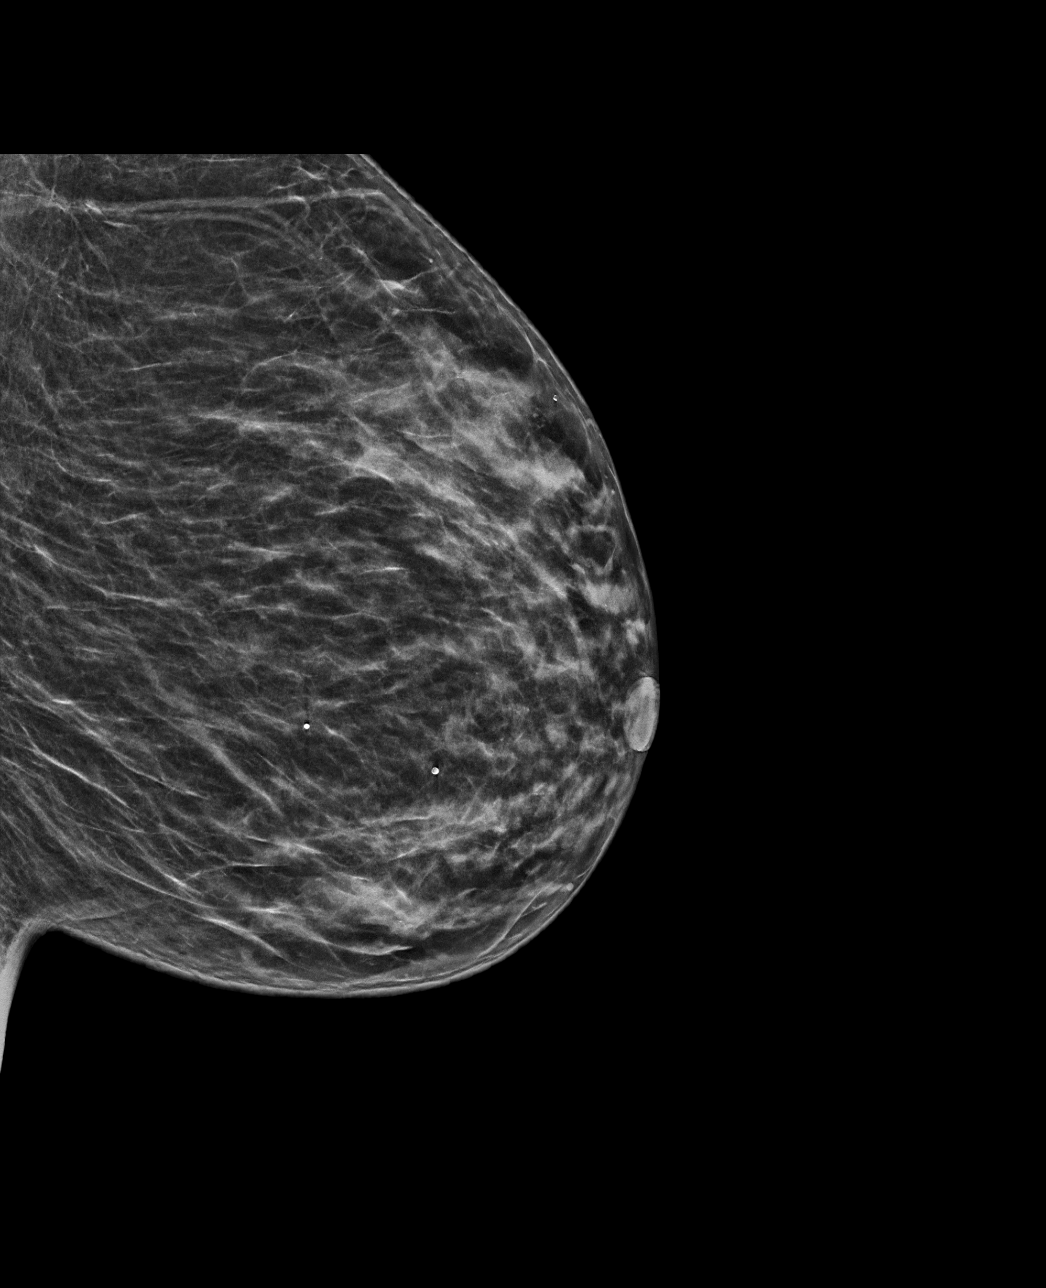

[L MLO tomo · tomo slice 21/42.0]
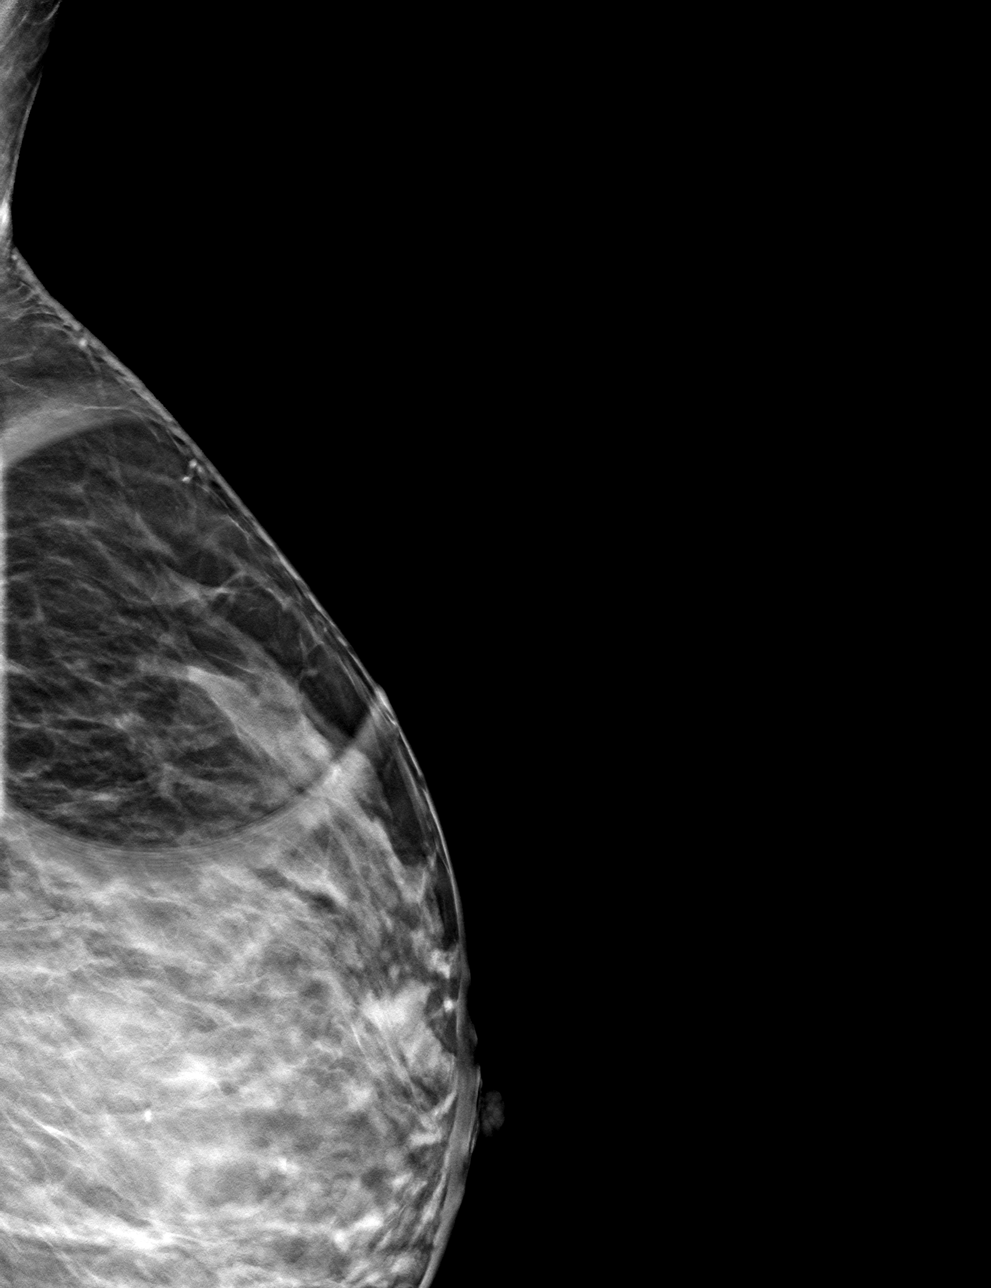

[L ML tomo · tomo slice 27/52.0]
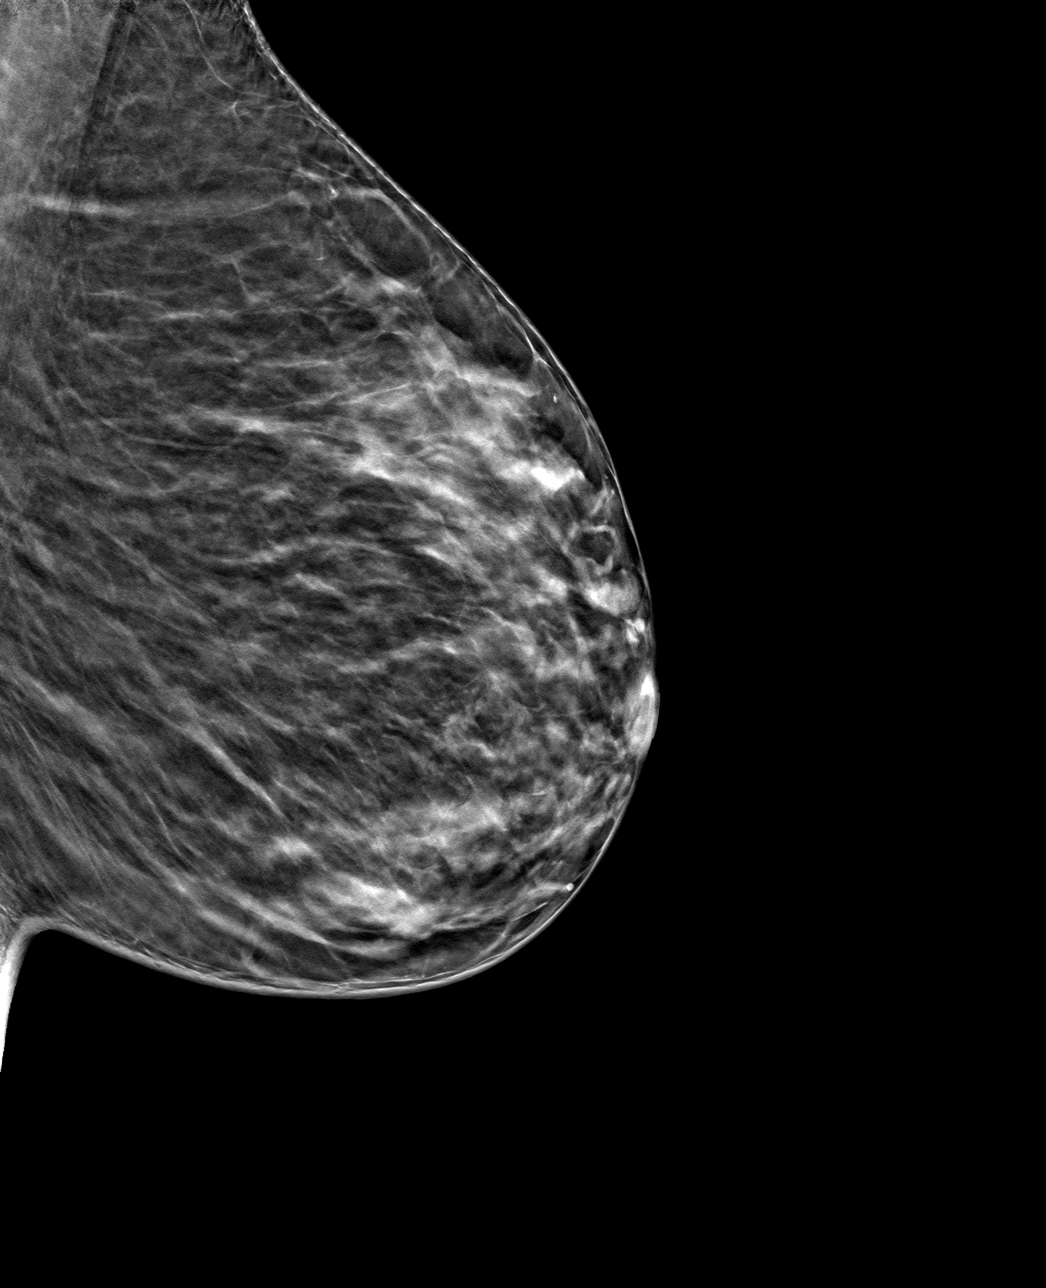

[4 of 12 positions shown; findings below may reference images not displayed]

ACR Breast Density Category c: The breast tissue is heterogeneously
dense, which may obscure small masses.
FINDINGS: The left breast asymmetry resolves into glandular tissue on repeat
and additional imaging.

Mammographic images were processed with CAD.
IMPRESSION: No mammographic evidence of malignancy on the left.

RECOMMENDATION:
Annual screening mammography.

I have discussed the findings and recommendations with the patient.
If applicable, a reminder letter will be sent to the patient
regarding the next appointment.

BI-RADS CATEGORY  1: Negative.

## 2022-03-28 ENCOUNTER — Other Ambulatory Visit (HOSPITAL_COMMUNITY): Payer: Self-pay

## 2022-04-12 ENCOUNTER — Other Ambulatory Visit (HOSPITAL_COMMUNITY): Payer: Self-pay

## 2022-04-13 ENCOUNTER — Other Ambulatory Visit (HOSPITAL_COMMUNITY): Payer: Self-pay

## 2022-04-20 ENCOUNTER — Other Ambulatory Visit: Payer: Self-pay | Admitting: "Endocrinology

## 2022-04-21 ENCOUNTER — Other Ambulatory Visit (HOSPITAL_COMMUNITY): Payer: Self-pay

## 2022-04-21 ENCOUNTER — Other Ambulatory Visit: Payer: Self-pay | Admitting: Internal Medicine

## 2022-04-21 ENCOUNTER — Encounter: Payer: Self-pay | Admitting: Nurse Practitioner

## 2022-04-21 ENCOUNTER — Other Ambulatory Visit: Payer: Self-pay | Admitting: "Endocrinology

## 2022-04-21 ENCOUNTER — Other Ambulatory Visit: Payer: Self-pay

## 2022-04-21 ENCOUNTER — Other Ambulatory Visit: Payer: Self-pay | Admitting: Nurse Practitioner

## 2022-04-21 ENCOUNTER — Ambulatory Visit (INDEPENDENT_AMBULATORY_CARE_PROVIDER_SITE_OTHER): Payer: 59 | Admitting: Nurse Practitioner

## 2022-04-21 VITALS — BP 105/67 | HR 88 | Ht 62.0 in | Wt 151.0 lb

## 2022-04-21 DIAGNOSIS — Z23 Encounter for immunization: Secondary | ICD-10-CM | POA: Diagnosis not present

## 2022-04-21 DIAGNOSIS — F419 Anxiety disorder, unspecified: Secondary | ICD-10-CM

## 2022-04-21 DIAGNOSIS — I10 Essential (primary) hypertension: Secondary | ICD-10-CM | POA: Diagnosis not present

## 2022-04-21 DIAGNOSIS — E1165 Type 2 diabetes mellitus with hyperglycemia: Secondary | ICD-10-CM | POA: Diagnosis not present

## 2022-04-21 MED ORDER — BUSPIRONE HCL 5 MG PO TABS
5.0000 mg | ORAL_TABLET | Freq: Three times a day (TID) | ORAL | 1 refills | Status: DC
  Filled 2022-04-21 (×2): qty 90, 30d supply, fill #0

## 2022-04-21 MED ORDER — LISINOPRIL 10 MG PO TABS
10.0000 mg | ORAL_TABLET | Freq: Every day | ORAL | 3 refills | Status: DC
  Filled 2022-04-21: qty 90, 90d supply, fill #0
  Filled 2022-08-29: qty 90, 90d supply, fill #1

## 2022-04-21 MED ORDER — BUSPIRONE HCL 5 MG PO TABS
5.0000 mg | ORAL_TABLET | Freq: Three times a day (TID) | ORAL | 1 refills | Status: DC
  Filled 2022-04-21: qty 90, 30d supply, fill #0

## 2022-04-21 NOTE — Assessment & Plan Note (Signed)
States that she missed her last appointment with endocrinologist Need to get her blood sugar under control discussed with the patient Patient encouraged to call the endocrinologist office and reschedule appointment today She denies hypoglycemia Currently on glipizide 5 mg daily, Lantus 24 units at bedtime Avoid sugar sweets soda Has upcoming diabetic eye exam

## 2022-04-21 NOTE — Assessment & Plan Note (Signed)
Patient educated on CDC recommendation for the vaccine. Verbal consent was obtained from the patient, vaccine administered by nurse, no sign of adverse reactions noted at this time. Patient education on arm soreness and use of tylenol  for this patient  was discussed. Patient educated on the signs and symptoms of adverse effect and advise to contact the office if they occur. Vaccine information sheet given to patient.  

## 2022-04-21 NOTE — Assessment & Plan Note (Signed)
BP Readings from Last 3 Encounters:  04/21/22 105/67  03/10/22 102/68  03/02/22 100/69  Chronic medical condition well-controlled on lisinopril 10 mg daily She denies dizziness, chest pain, edema Continue current medication DASH diet advised engage in regular moderate exercises at least 150 minutes weekly Follow-up in 4 months

## 2022-04-21 NOTE — Assessment & Plan Note (Addendum)
Anxiety slightly improved,GAD 7 score 16.  Currently on buspirone 5 mg twice daily Start buspirone 5 mg 3 times daily She may increase dose by 5 mg every 2-3 days up to 30 mg daily Plan discussed with patient she verbalized understanding

## 2022-04-21 NOTE — Patient Instructions (Signed)
Please start taking buspirone 5mg  three times daily , you may increase your dose every 2-3 days by 5 mg up to a total of 30mg  daily.    It is important that you exercise regularly at least 30 minutes 5 times a week.  Think about what you will eat, plan ahead. Choose " clean, green, fresh or frozen" over canned, processed or packaged foods which are more sugary, salty and fatty. 70 to 75% of food eaten should be vegetables and fruit. Three meals at set times with snacks allowed between meals, but they must be fruit or vegetables. Aim to eat over a 12 hour period , example 7 am to 7 pm, and STOP after  your last meal of the day. Drink water,generally about 64 ounces per day, no other drink is as healthy. Fruit juice is best enjoyed in a healthy way, by EATING the fruit.  Thanks for choosing [facility], we consider it a privelige to serve you.

## 2022-04-21 NOTE — Progress Notes (Signed)
Established Patient Office Visit  Subjective:  Patient ID: Anne Wright, female    DOB: 1976-07-18  Age: 46 y.o. MRN: [id number]  CC:  Chief Complaint  Patient presents with   Hypertension    6 week f/u   Anxiety    6 week f/u     HPI Anne Wright is a 46 y.o. female with past medical history of hypertension, uncontrolled type 2 diabetes, anxiety, hyperlipidemia who presents for follow-up for hypertension and anxiety.    Hypertension .currently on lisinopril 10 mg daily ,no dizzy spell since she started lisinopril 14m daily .   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  Family History  Problem Relation Age of Onset   Diabetes Mother    Hypertension Mother    Asthma Brother    Breast cancer Maternal Grandmother    Asthma Son    Colon cancer Neg Hx    Cancer - Lung Neg Hx    Cervical cancer Neg Hx     Social History   Socioeconomic History   Marital status: Married    Spouse name: Not on file   Number of children: 2   Years of education: Not on file   Highest education level: Not on file  Occupational History   Not on file  Tobacco Use   Smoking status: Former    Types: Cigarettes    Quit date: 06/29/2018    Years since quitting: 3.8    Passive exposure: Past   Smokeless tobacco: Never   Tobacco comments:    She smoked 10 cigarettes a day for 15 years , quit 2019.   Vaping Use   Vaping Use: Never used  Substance and Sexual Activity   Alcohol use: Yes    Comment: occa   Drug use: No   Sexual activity: Not Currently  Other Topics Concern   Not on file  Social History  Narrative   Lives with her son.    Social Determinants of Health   Financial Resource Strain: Not on file  Food Insecurity: Not on file  Transportation Needs: Not on file  Physical Activity: Not on file  Stress: Not on file  Social Connections: Not on file  Intimate Partner Violence: Not on file    Outpatient Medications Prior to Visit  Medication Sig Dispense Refill   aspirin EC 81 MG tablet Take 81 mg by mouth daily.     atorvastatin (LIPITOR) 20 MG tablet Take 1 tablet (20 mg total) by mouth daily. 90 tablet 1   Blood Glucose Monitoring Suppl (ACCU-CHEK GUIDE ME) w/Device KIT 1 Piece by Does not apply route as directed. 1 kit 0   famotidine (PEPCID) 20 MG tablet Take 1 tablet (20 mg total) by mouth 2 (two) times daily. 60 tablet 11   glipiZIDE (GLUCOTROL XL) 5 MG 24 hr tablet TAKE 1 TABLET(5 MG) BY MOUTH DAILY WITH BREAKFAST 90 tablet 0   glucose blood (ACCU-CHEK GUIDE) test strip Use as instructed 150 each 2   insulin glargine (LANTUS SOLOSTAR) 100 UNIT/ML Solostar Pen Inject 24 Units  into the skin at bedtime. 15 mL 1   Insulin Pen Needle (BD PEN NEEDLE NANO U/F) 32G X 4 MM MISC Once daily 100 each 3   Lancets (FREESTYLE) lancets Test blod glucose four times daily. Use as instructed 100 each 12   lisinopril (ZESTRIL) 10 MG tablet Take 1 tablet (10 mg total) by mouth daily. 90 tablet 3   busPIRone (BUSPAR) 5 MG tablet Take 1 tablet (5 mg total) by mouth 2 (two) times daily. 60 tablet 3   No facility-administered medications prior to visit.    Allergies  Allergen Reactions   Amoxicillin    Shellfish Allergy     ROS Review of Systems  Constitutional: Negative.  Negative for activity change, appetite change and chills.  HENT: Negative.  Negative for congestion, sinus pressure and sneezing.   Respiratory: Negative.  Negative for apnea, choking and chest tightness.   Cardiovascular: Negative.  Negative for chest pain, palpitations and leg swelling.  Gastrointestinal:  Negative.  Negative for abdominal distention, abdominal pain and anal bleeding.  Endocrine: Negative.  Negative for polydipsia, polyphagia and polyuria.  Neurological: Negative.  Negative for dizziness, facial asymmetry and headaches.  Psychiatric/Behavioral: Negative.  Negative for agitation, behavioral problems and confusion.       Objective:    Physical Exam Constitutional:      General: She is not in acute distress.    Appearance: She is not ill-appearing, toxic-appearing or diaphoretic.  Cardiovascular:     Rate and Rhythm: Normal rate and regular rhythm.     Pulses: Normal pulses.     Heart sounds: No murmur heard.    No friction rub. No gallop.  Pulmonary:     Effort: Pulmonary effort is normal. No respiratory distress.     Breath sounds: Normal breath sounds. No stridor. No wheezing, rhonchi or rales.  Chest:     Chest wall: No tenderness.  Abdominal:     Palpations: Abdomen is soft.     Tenderness: There is no abdominal tenderness.  Musculoskeletal:        General: No swelling, tenderness, deformity or signs of injury.     Right lower leg: No edema.     Left lower leg: No edema.  Skin:    General: Skin is warm.     Capillary Refill: Capillary refill takes less than 2 seconds.     Coloration: Skin is not jaundiced.     Findings: No bruising or lesion.  Neurological:     Mental Status: She is alert and oriented to person, place, and time.     Cranial Nerves: No cranial nerve deficit.     Sensory: No sensory deficit.     Motor: No weakness.     Coordination: Coordination normal.     Gait: Gait normal.  Psychiatric:        Mood and Affect: Mood normal.        Behavior: Behavior normal.        Thought Content: Thought content normal.        Judgment: Judgment normal.     BP 105/67 (BP Location: Right Arm, Patient Position: Sitting, Cuff Size: Normal)   Pulse 88   Ht _0  (1.575 m)   Wt 151 lb (68.5 kg)   LMP 04/19/2022 (Approximate)   SpO2 98%   BMI 27.62  kg/m  Wt Readings from Last 3 Encounters:  04/21/22 151 lb (68.5 kg)  03/10/22 154 lb (69.9 kg)  03/02/22 152 lb 14.4 oz (69.4 kg)  Lab Results  Component Value Date   TSH 1.590 04/21/2021   Lab Results  Component Value Date   WBC 7.7 09/07/2021   HGB 12.1 09/07/2021   HCT 36.0 09/07/2021   MCV 93 09/07/2021   PLT 272 09/07/2021   Lab Results  Component Value Date   NA 144 02/08/2022   K 4.5 02/08/2022   CO2 19 (L) 02/08/2022   GLUCOSE 165 (H) 02/08/2022   BUN 26 (H) 02/08/2022   CREATININE 0.88 02/08/2022   BILITOT <0.2 02/08/2022   ALKPHOS 74 02/08/2022   AST 31 02/08/2022   ALT 49 (H) 02/08/2022   PROT 6.3 02/08/2022   ALBUMIN 4.0 02/08/2022   CALCIUM 9.2 02/08/2022   ANIONGAP 7 09/16/2020   EGFR 84 02/08/2022   Lab Results  Component Value Date   CHOL 155 02/08/2022   Lab Results  Component Value Date   HDL 49 02/08/2022   Lab Results  Component Value Date   LDLCALC 70 02/08/2022   Lab Results  Component Value Date   TRIG 221 (H) 02/08/2022   Lab Results  Component Value Date   CHOLHDL 3.2 02/08/2022   Lab Results  Component Value Date   HGBA1C 9.3 (A) 11/01/2021      Assessment & Plan:   Problem List Items Addressed This Visit       Cardiovascular and Mediastinum   Essential hypertension, benign - Primary    BP Readings from Last 3 Encounters:  04/21/22 105/67  03/10/22 102/68  03/02/22 100/69  Chronic medical condition well-controlled on lisinopril 10 mg daily She denies dizziness, chest pain, edema Continue current medication DASH diet advised engage in regular moderate exercises at least 150 minutes weekly Follow-up in 4 months        Endocrine   Uncontrolled type 2 diabetes mellitus with hyperglycemia ([place])    States that she missed her last appointment with endocrinologist Need to get her blood sugar under control discussed with the patient Patient encouraged to call the endocrinologist office and reschedule  appointment today She denies hypoglycemia Currently on glipizide 5 mg daily, Lantus 24 units at bedtime Avoid sugar sweets soda Has upcoming diabetic eye exam        Other   Anxiety    Anxiety slightly improved,GAD 7 score 16.  Currently on buspirone 5 mg twice daily Start buspirone 5 mg 3 times daily She may increase dose by 5 mg every 2-3 days up to 30 mg daily Plan discussed with patient she verbalized understanding        Relevant Medications   busPIRone (BUSPAR) 5 MG tablet   Need for immunization against influenza    Patient educated on CDC recommendation for the vaccine. Verbal consent was obtained from the patient, vaccine administered by nurse, no sign of adverse reactions noted at this time. Patient education on arm soreness and use of tylenol  for this patient  was discussed. Patient educated on the signs and symptoms of adverse effect and advise to contact the office if they occur. Vaccine information sheet given to patient.       Relevant Orders   Flu Vaccine QUAD 74moIM (Fluarix, Fluzone & Alfiuria Quad PF) (Completed)    Meds ordered this encounter  Medications   busPIRone (BUSPAR) 5 MG tablet    Sig: Take 1 tablet (5 mg total) by mouth 3 (three) times daily.    Dispense:  90 tablet    Refill:  1    Follow-up: Return in about 4  months (around 08/21/2022) for HTN/DM/HLD.    Renee Rival, FNP

## 2022-04-22 ENCOUNTER — Other Ambulatory Visit (HOSPITAL_COMMUNITY): Payer: Self-pay

## 2022-04-25 ENCOUNTER — Other Ambulatory Visit (HOSPITAL_COMMUNITY): Payer: Self-pay

## 2022-04-25 MED ORDER — FAMOTIDINE 20 MG PO TABS
20.0000 mg | ORAL_TABLET | Freq: Two times a day (BID) | ORAL | 11 refills | Status: DC
  Filled 2022-04-25: qty 60, 30d supply, fill #0

## 2022-04-27 ENCOUNTER — Other Ambulatory Visit (HOSPITAL_COMMUNITY): Payer: Self-pay

## 2022-04-28 ENCOUNTER — Other Ambulatory Visit (HOSPITAL_COMMUNITY): Payer: Self-pay

## 2022-04-29 ENCOUNTER — Other Ambulatory Visit (HOSPITAL_COMMUNITY): Payer: Self-pay

## 2022-05-10 ENCOUNTER — Other Ambulatory Visit: Payer: Self-pay | Admitting: "Endocrinology

## 2022-05-10 ENCOUNTER — Other Ambulatory Visit (HOSPITAL_COMMUNITY): Payer: Self-pay

## 2022-05-10 ENCOUNTER — Telehealth: Payer: Self-pay | Admitting: "Endocrinology

## 2022-05-10 DIAGNOSIS — E1165 Type 2 diabetes mellitus with hyperglycemia: Secondary | ICD-10-CM

## 2022-05-10 DIAGNOSIS — E782 Mixed hyperlipidemia: Secondary | ICD-10-CM

## 2022-05-10 MED ORDER — FREESTYLE LITE TEST VI STRP
ORAL_STRIP | 2 refills | Status: DC
  Filled 2022-05-10: qty 100, 25d supply, fill #0

## 2022-05-10 MED ORDER — FREESTYLE LITE W/DEVICE KIT
PACK | 0 refills | Status: DC
  Filled 2022-05-10: qty 1, 1d supply, fill #0

## 2022-05-10 MED ORDER — ACCU-CHEK GUIDE ME W/DEVICE KIT
1.0000 | PACK | 0 refills | Status: DC
  Filled 2022-05-10: qty 1, 30d supply, fill #0

## 2022-05-10 MED ORDER — FREESTYLE LANCETS MISC
12 refills | Status: DC
  Filled 2022-05-10: qty 100, 25d supply, fill #0

## 2022-05-10 NOTE — Telephone Encounter (Signed)
Pt is asking for refills on her Lantus, Glipizide & Atorvastatin. Anne Wright.

## 2022-05-10 NOTE — Telephone Encounter (Signed)
Pt needs a new meter, Accu Chek Guide me. Can you send this in, the last one broke. [place]

## 2022-05-11 ENCOUNTER — Other Ambulatory Visit (HOSPITAL_COMMUNITY): Payer: Self-pay

## 2022-05-11 MED ORDER — GLIPIZIDE ER 5 MG PO TB24
5.0000 mg | ORAL_TABLET | Freq: Every day | ORAL | 0 refills | Status: DC
  Filled 2022-05-11: qty 30, 30d supply, fill #0

## 2022-05-11 MED ORDER — INSULIN GLARGINE-YFGN 100 UNIT/ML ~~LOC~~ SOPN
24.0000 [IU] | PEN_INJECTOR | Freq: Every day | SUBCUTANEOUS | 0 refills | Status: DC
  Filled 2022-05-11: qty 15, 62d supply, fill #0

## 2022-05-11 MED ORDER — ATORVASTATIN CALCIUM 20 MG PO TABS
20.0000 mg | ORAL_TABLET | Freq: Every day | ORAL | 0 refills | Status: DC
  Filled 2022-05-11: qty 30, 30d supply, fill #0

## 2022-05-11 NOTE — Telephone Encounter (Signed)
Rx refill(s) sent.

## 2022-05-12 ENCOUNTER — Ambulatory Visit: Payer: 59 | Admitting: "Endocrinology

## 2022-05-13 ENCOUNTER — Other Ambulatory Visit (HOSPITAL_COMMUNITY): Payer: Self-pay

## 2022-05-31 ENCOUNTER — Telehealth: Payer: Self-pay | Admitting: Family Medicine

## 2022-05-31 NOTE — Telephone Encounter (Signed)
Pt called wanting to know if she can please get a copy of the flu vaccine she received. She is wanting to pick this up today if possible.

## 2022-05-31 NOTE — Telephone Encounter (Signed)
Flu vaccine record printed, pt has picked it up.

## 2022-06-08 ENCOUNTER — Telehealth: Payer: Self-pay

## 2022-06-08 ENCOUNTER — Encounter: Payer: Self-pay | Admitting: "Endocrinology

## 2022-06-08 ENCOUNTER — Other Ambulatory Visit (HOSPITAL_COMMUNITY): Payer: Self-pay

## 2022-06-08 ENCOUNTER — Ambulatory Visit (INDEPENDENT_AMBULATORY_CARE_PROVIDER_SITE_OTHER): Payer: 59 | Admitting: "Endocrinology

## 2022-06-08 VITALS — BP 140/88 | HR 96 | Ht 62.0 in | Wt 155.4 lb

## 2022-06-08 DIAGNOSIS — I10 Essential (primary) hypertension: Secondary | ICD-10-CM | POA: Diagnosis not present

## 2022-06-08 DIAGNOSIS — E559 Vitamin D deficiency, unspecified: Secondary | ICD-10-CM

## 2022-06-08 DIAGNOSIS — E1165 Type 2 diabetes mellitus with hyperglycemia: Secondary | ICD-10-CM | POA: Diagnosis not present

## 2022-06-08 DIAGNOSIS — E782 Mixed hyperlipidemia: Secondary | ICD-10-CM

## 2022-06-08 DIAGNOSIS — Z91199 Patient's noncompliance with other medical treatment and regimen due to unspecified reason: Secondary | ICD-10-CM

## 2022-06-08 LAB — POCT GLYCOSYLATED HEMOGLOBIN (HGB A1C): HbA1c, POC (controlled diabetic range): 10.3 % — AB (ref 0.0–7.0)

## 2022-06-08 MED ORDER — FREESTYLE LITE TEST VI STRP
ORAL_STRIP | Freq: Four times a day (QID) | 2 refills | Status: DC
  Filled 2022-06-08: qty 100, 25d supply, fill #0

## 2022-06-08 MED ORDER — INSULIN GLARGINE-YFGN 100 UNIT/ML ~~LOC~~ SOPN: 30.0000 [IU] | PEN_INJECTOR | Freq: Every day | SUBCUTANEOUS | 1 refills | Status: DC

## 2022-06-08 MED ORDER — ATORVASTATIN CALCIUM 20 MG PO TABS
20.0000 mg | ORAL_TABLET | Freq: Every day | ORAL | 1 refills | Status: DC
  Filled 2022-06-08: qty 90, 90d supply, fill #0
  Filled 2022-08-29: qty 90, 90d supply, fill #1

## 2022-06-08 NOTE — Patient Instructions (Signed)

## 2022-06-08 NOTE — Telephone Encounter (Signed)
Rx refill for Freestyle Lite glucose test strips sent to [facility].

## 2022-06-08 NOTE — Progress Notes (Unsigned)
06/08/2022, 5:30 PM  Endocrinology follow-up note  Subjective:    Patient ID: Anne Wright, female    DOB: July 12, 1976.  Cheyla Lasota is being seen in follow-up after she was seen in consultation for management of currently uncontrolled symptomatic diabetes requested by  Alvira Monday, [place].   Past Medical History:  Diagnosis Date   Diabetes mellitus without complication (HCC)    High cholesterol    Hypertension     History reviewed. No pertinent surgical history.  Social History   Socioeconomic History   Marital status: Married    Spouse name: Not on file   Number of children: 2   Years of education: Not on file   Highest education level: Not on file  Occupational History   Not on file  Tobacco Use   Smoking status: Former    Types: Cigarettes    Quit date: 06/29/2018    Years since quitting: 3.9    Passive exposure: Past   Smokeless tobacco: Never   Tobacco comments:    She smoked 10 cigarettes a day for 15 years , quit 2019.   Vaping Use   Vaping Use: Never used  Substance and Sexual Activity   Alcohol use: Yes    Comment: occa   Drug use: No   Sexual activity: Not Currently  Other Topics Concern   Not on file  Social History Narrative   Lives with her son.    Social Determinants of Health   Financial Resource Strain: Not on file  Food Insecurity: Not on file  Transportation Needs: Not on file  Physical Activity: Not on file  Stress: Not on file  Social Connections: Not on file    Family History  Problem Relation Age of Onset   Diabetes Mother    Hypertension Mother    Asthma Brother    Breast cancer Maternal Grandmother    Asthma Son    Colon cancer Neg Hx    Cancer - Lung Neg Hx    Cervical cancer Neg Hx     Outpatient Encounter Medications as of 06/08/2022  Medication Sig   aspirin EC 81 MG tablet Take 81 mg by mouth daily.   atorvastatin  (LIPITOR) 20 MG tablet Take 1 tablet (20 mg total) by mouth daily.   Blood Glucose Monitoring Suppl (FREESTYLE LITE) w/Device KIT Use to monitor glucose 4 times a day   busPIRone (BUSPAR) 5 MG tablet Take 1 tablet (5 mg total) by mouth 3 (three) times daily.   famotidine (PEPCID) 20 MG tablet Take 1 tablet (20 mg total) by mouth 2 (two) times daily.   glipiZIDE (GLUCOTROL XL) 5 MG 24 hr tablet Take 1 tablet (5 mg total) by mouth daily with breakfast.   insulin glargine-yfgn (SEMGLEE) 100 UNIT/ML Pen Inject 30 Units into the skin at bedtime.   Insulin Pen Needle (BD PEN NEEDLE NANO U/F) 32G X 4 MM MISC Once daily   Lancets (FREESTYLE) lancets Test blood glucose four times daily. Use as instructed   lisinopril (ZESTRIL) 10 MG tablet Take 1 tablet (10 mg total) by mouth daily.   [  DISCONTINUED] atorvastatin (LIPITOR) 20 MG tablet Take 1 tablet (20 mg total) by mouth daily.   [DISCONTINUED] glucose blood (FREESTYLE LITE) test strip Use to monitor glucose 4 times a  day as instructed   [DISCONTINUED] insulin glargine (LANTUS SOLOSTAR) 100 UNIT/ML Solostar Pen Inject 24 Units into the skin at bedtime.   [DISCONTINUED] insulin glargine-yfgn (SEMGLEE) 100 UNIT/ML Pen Inject 24 Units into the skin at bedtime.   No facility-administered encounter medications on file as of 06/08/2022.    ALLERGIES: Allergies  Allergen Reactions   Amoxicillin    Shellfish Allergy     VACCINATION STATUS: Immunization History  Administered Date(s) Administered   Hepatitis B, adult 08/06/2019, 09/11/2019   Influenza,inj,Quad PF,6+ Mos 04/21/2022   Influenza,inj,quad, With Preservative 08/01/2019   Influenza-Unspecified 04/29/2021   Moderna Sars-Covid-2 Vaccination 04/02/2020   PFIZER(Purple Top)SARS-COV-2 Vaccination 08/01/2020   PPD Test 04/08/2020, 11/22/2021   Tdap 04/10/2015, 08/01/2019    Diabetes She presents for her follow-up diabetic visit. She has type 2 diabetes mellitus. Onset time: She was diagnosed  at approximate age of 71 years.  She did have a gestational diabetes during her first pregnancy 19 years ago. Her disease course has been worsening. There are no hypoglycemic associated symptoms. Pertinent negatives for hypoglycemia include no confusion, headaches, pallor or seizures. Associated symptoms include polydipsia and polyuria. Pertinent negatives for diabetes include no blurred vision, no chest pain, no fatigue and no polyphagia. There are no hypoglycemic complications. Symptoms are worsening. There are no diabetic complications. Risk factors for coronary artery disease include diabetes mellitus and dyslipidemia. Current diabetic treatments: She is not tolerating her Metformin.  Currently taking Trulicity 4.40 mg subcutaneously weekly. Her weight is fluctuating minimally. She is following a generally unhealthy diet. When asked about meal planning, she reported none. She has not had a previous visit with a dietitian. She rarely participates in exercise. Her home blood glucose trend is increasing steadily. Her breakfast blood glucose range is generally 180-200 mg/dl. Her overall blood glucose range is 180-200 mg/dl. (She presents with  worsening glucose readings EAG of 198 in 30 days .   Her point-of-care A1c is 10.3%, increasing from 7.9% . She denies any hypoglycemia.   ) An ACE inhibitor/angiotensin II receptor blocker is not being taken. Eye exam is not current.  Hyperlipidemia This is a chronic problem. The current episode started more than 1 year ago. The problem is uncontrolled. Exacerbating diseases include diabetes. Pertinent negatives include no chest pain, myalgias or shortness of breath. Current antihyperlipidemic treatment includes statins. Risk factors for coronary artery disease include diabetes mellitus, dyslipidemia and family history.    Review of Systems  Constitutional:  Negative for chills, fatigue, fever and unexpected weight change.  HENT:  Negative for trouble swallowing and  voice change.   Eyes:  Negative for blurred vision and visual disturbance.  Respiratory:  Negative for cough, shortness of breath and wheezing.   Cardiovascular:  Negative for chest pain, palpitations and leg swelling.  Gastrointestinal:  Negative for diarrhea, nausea and vomiting.  Endocrine: Positive for polydipsia and polyuria. Negative for cold intolerance, heat intolerance and polyphagia.  Musculoskeletal:  Negative for arthralgias and myalgias.  Skin:  Negative for color change, pallor, rash and wound.  Neurological:  Negative for seizures and headaches.  Psychiatric/Behavioral:  Negative for confusion and suicidal ideas.     Objective:       06/08/2022    2:59 PM 04/21/2022    8:06 AM 03/10/2022    9:16 AM  Vitals with  BMI  Height _0  _1  _2   Weight 155 lbs 6 oz 151 lbs 154 lbs  BMI 28.42 09.47 09.62  Systolic 836 629 476  Diastolic 88 67 68  Pulse 96 88 96    BP (!) 140/88   Pulse 96   Ht _3  (1.575 m)   Wt 155 lb 6.4 oz (70.5 kg)   BMI 28.42 kg/m   Wt Readings from Last 3 Encounters:  06/08/22 155 lb 6.4 oz (70.5 kg)  04/21/22 151 lb (68.5 kg)  03/10/22 154 lb (69.9 kg)     Physical Exam Constitutional:      Appearance: She is well-developed.  HENT:     Head: Normocephalic and atraumatic.  Neck:     Thyroid: No thyromegaly.     Trachea: No tracheal deviation.  Cardiovascular:     Rate and Rhythm: Normal rate and regular rhythm.  Pulmonary:     Effort: Pulmonary effort is normal.  Abdominal:     Tenderness: There is no abdominal tenderness. There is no guarding.  Musculoskeletal:        General: Normal range of motion.     Cervical back: Normal range of motion and neck supple.  Skin:    General: Skin is warm and dry.     Coloration: Skin is not pale.     Findings: No erythema or rash.  Neurological:     Mental Status: She is alert and oriented to person, place, and time.     Cranial Nerves: No cranial nerve deficit.     Coordination:  Coordination normal.     Deep Tendon Reflexes: Reflexes are normal and symmetric.  Psychiatric:        Judgment: Judgment normal.      CMP ( most recent) CMP     Component Value Date/Time   NA 144 02/08/2022 0811   K 4.5 02/08/2022 0811   CL 110 (H) 02/08/2022 0811   CO2 19 (L) 02/08/2022 0811   GLUCOSE 165 (H) 02/08/2022 0811   GLUCOSE 152 (H) 09/16/2020 2043   BUN 26 (H) 02/08/2022 0811   CREATININE 0.88 02/08/2022 0811   CREATININE 0.66 05/19/2020 0750   CALCIUM 9.2 02/08/2022 0811   PROT 6.3 02/08/2022 0811   ALBUMIN 4.0 02/08/2022 0811   AST 31 02/08/2022 0811   ALT 49 (H) 02/08/2022 0811   ALKPHOS 74 02/08/2022 0811   BILITOT <0.2 02/08/2022 0811   GFRNONAA >60 09/16/2020 2043   GFRNONAA 109 05/19/2020 0750   GFRAA 126 05/19/2020 0750     Diabetic Labs (most recent): Lab Results  Component Value Date   HGBA1C 10.3 (A) 06/08/2022   HGBA1C 9.3 (A) 11/01/2021   HGBA1C 7.9 (A) 04/27/2021   MICROALBUR 150 10/20/2020   Recent Results (from the past 2160 hour(s))  HgB A1c     Status: Abnormal   Collection Time: 06/08/22  3:12 PM  Result Value Ref Range   Hemoglobin A1C     HbA1c POC (<> result, manual entry)     HbA1c, POC (prediabetic range)     HbA1c, POC (controlled diabetic range) 10.3 (A) 0.0 - 7.0 %    Lipid Panel     Component Value Date/Time   CHOL 155 02/08/2022 0811   TRIG 221 (H) 02/08/2022 0811   HDL 49 02/08/2022 0811   CHOLHDL 3.2 02/08/2022 0811   CHOLHDL 2.1 05/19/2020 0750   LDLCALC 70 02/08/2022 0811   LDLCALC 60 05/19/2020 0750   LABVLDL 36 02/08/2022  0811     Assessment & Plan:   1. Uncontrolled type 2 diabetes mellitus with hyperglycemia ([place])  - Simon Gary has currently uncontrolled symptomatic type 2 DM since 47 years of age.  She presents with  worsening glucose readings EAG of 198 in 30 days .   Her point-of-care A1c is 10.3%, increasing from 7.9% . She denies any hypoglycemia.    - Recent labs reviewed. - I had a  long discussion with her about the progressive nature of diabetes and the pathology behind its complications. -She did not report gross complications from her diabetes, however, she remains at a high risk for more acute and chronic complications which include CAD, CVA, CKD, retinopathy, and neuropathy. These are all discussed in detail with her.  - I have counseled her on diet  and weight management  by adopting a carbohydrate restricted/protein rich diet. Patient is encouraged to switch to  unprocessed or minimally processed     complex starch and increased protein intake (animal or plant source), fruits, and vegetables. -  she is advised to stick to a routine mealtimes to eat 3 meals  a day and avoid unnecessary snacks ( to snack only to correct hypoglycemia).   - she acknowledges that there is a room for improvement in her food and drink choices. - Suggestion is made for her to avoid simple carbohydrates  from her diet including Cakes, Sweet Desserts, Ice Cream, Soda (diet and regular), Sweet Tea, Candies, Chips, Cookies, Store Bought Juices, Alcohol in Excess of  1-2 drinks a day, Artificial Sweeteners,  Coffee Creamer, and "Sugar-free" Products, Lemonade. This will help patient to have more stable blood glucose profile and potentially avoid unintended weight gain.  - she has been scheduled with Jearld Fenton, RDN, CDE for diabetes education.  - I have approached her with the following individualized plan to manage  her diabetes and patient agrees:   -She is approached again to re-engage for monitoring blood glucose 4 times a day-daily before meals and at bedtime and return in 2 weeks with her meter and logs.   This patient will likely require multiple daily injections of insulin in order for her to achieve and control diabetes to target.    -She worries about hypoglycemia and rightly so in light of the fact that she is having rare, random, mild hypoglycemia.    -In the mean time , she is   advised to increase  Lantus to 30 units nightly, continue glipizide 5 mg XL p.o. daily at breakfast.     - she is encouraged to call clinic for blood glucose levels less than 70 or above 200 mg /dl.  She did not tolerate metformin, did not afford Trulicity.  - Specific targets for  A1c;  LDL, HDL,  and Triglycerides were discussed with the patient.  2) Blood Pressure /Hypertension:  -Her blood pressure is controlled to target. -She is advised to continue lisinopril 20 mg p.o. daily  at breakfast.  She also has urine microalbuminuria.     3) Lipids/Hyperlipidemia:   Review of her recent lipid panel showed improved LDL improving to 60 from 151.  She is advised to continue Atorvastatin  20 mg p.o. nightly.  Side effects and precautions discussed with her.     4)  Weight/Diet:  Body mass index is 28.42 kg/m.   she is is a candidate for modest weight loss.   Exercise, and detailed carbohydrates information provided  -  detailed on discharge instructions.  5)  Chronic Care/Health Maintenance:  -she  is on Statin medications and  is encouraged to initiate and continue to follow up with Ophthalmology, Dentist,  Podiatrist at least yearly or according to recommendations, and advised to   stay away from smoking. I have recommended yearly flu vaccine and pneumonia vaccine at least every 5 years; moderate intensity exercise for up to 150 minutes weekly; and  sleep for at least 7 hours a day.  - she is  advised to maintain close follow up with Alvira Monday, FNP for primary care needs, as well as her other providers for optimal and coordinated care.   I spent 41 minutes in the care of the patient today including review of labs from [place], Lipids, Thyroid Function, Hematology (current and previous including abstractions from other facilities); face-to-face time discussing  her blood glucose readings/logs, discussing hypoglycemia and hyperglycemia episodes and symptoms, medications doses, her options of  short and long term treatment based on the latest standards of care / guidelines;  discussion about incorporating lifestyle medicine;  and documenting the encounter. Risk reduction counseling performed per USPSTF guidelines to reduce  cardiovascular risk factors.     Please refer to Patient Instructions for Blood Glucose Monitoring and Insulin/Medications Dosing Guide"  in media tab for additional information. Please  also refer to " Patient Self Inventory" in the Media  tab for reviewed elements of pertinent patient history.  Ky Ironside participated in the discussions, expressed understanding, and voiced agreement with the above plans.  All questions were answered to her satisfaction. she is encouraged to contact clinic should she have any questions or concerns prior to her return visit.    Follow up plan: - Return in about 2 weeks (around 06/22/2022) for F/U with Meter/CGM Edison Simon Only - no Labs.  Glade Lloyd, MD [facility] [address] [place], [place] [address] Phone: [phone]  Fax: [phone]    06/08/2022, 5:30 PM  This note was partially dictated with voice recognition software. Similar sounding words can be transcribed inadequately or may not  be corrected upon review.

## 2022-06-13 ENCOUNTER — Other Ambulatory Visit (HOSPITAL_COMMUNITY): Payer: Self-pay

## 2022-06-13 DIAGNOSIS — Z1231 Encounter for screening mammogram for malignant neoplasm of breast: Secondary | ICD-10-CM | POA: Diagnosis not present

## 2022-06-14 ENCOUNTER — Other Ambulatory Visit (HOSPITAL_COMMUNITY): Payer: Self-pay

## 2022-06-15 ENCOUNTER — Other Ambulatory Visit (HOSPITAL_COMMUNITY): Payer: Self-pay

## 2022-06-29 ENCOUNTER — Ambulatory Visit (INDEPENDENT_AMBULATORY_CARE_PROVIDER_SITE_OTHER): Payer: 59 | Admitting: "Endocrinology

## 2022-06-29 ENCOUNTER — Other Ambulatory Visit (HOSPITAL_COMMUNITY): Payer: Self-pay

## 2022-06-29 ENCOUNTER — Encounter: Payer: Self-pay | Admitting: "Endocrinology

## 2022-06-29 ENCOUNTER — Other Ambulatory Visit: Payer: Self-pay | Admitting: "Endocrinology

## 2022-06-29 VITALS — BP 122/78 | HR 88 | Ht 62.0 in | Wt 155.6 lb

## 2022-06-29 DIAGNOSIS — E1165 Type 2 diabetes mellitus with hyperglycemia: Secondary | ICD-10-CM

## 2022-06-29 DIAGNOSIS — E782 Mixed hyperlipidemia: Secondary | ICD-10-CM

## 2022-06-29 DIAGNOSIS — E559 Vitamin D deficiency, unspecified: Secondary | ICD-10-CM | POA: Diagnosis not present

## 2022-06-29 DIAGNOSIS — I10 Essential (primary) hypertension: Secondary | ICD-10-CM

## 2022-06-29 MED ORDER — GLIPIZIDE ER 5 MG PO TB24
5.0000 mg | ORAL_TABLET | Freq: Every day | ORAL | 2 refills | Status: DC
  Filled 2022-06-29: qty 30, 30d supply, fill #0
  Filled 2022-08-29: qty 30, 30d supply, fill #1
  Filled 2022-10-17 – 2022-10-24 (×2): qty 30, 30d supply, fill #2

## 2022-06-29 MED ORDER — INSULIN GLARGINE-YFGN 100 UNIT/ML ~~LOC~~ SOPN: 26.0000 [IU] | PEN_INJECTOR | Freq: Every day | SUBCUTANEOUS | 1 refills | Status: DC

## 2022-06-29 NOTE — Progress Notes (Signed)
06/29/2022, 2:06 PM  Endocrinology follow-up note  Subjective:    Patient ID: Anne Wright, female    DOB: Feb 03, 1976.  Bona Tulloch is being seen in follow-up after she was seen in consultation for management of currently uncontrolled symptomatic diabetes requested by  Alvira Monday, [place].   Past Medical History:  Diagnosis Date   Diabetes mellitus without complication (HCC)    High cholesterol    Hypertension     History reviewed. No pertinent surgical history.  Social History   Socioeconomic History   Marital status: Married    Spouse name: Not on file   Number of children: 2   Years of education: Not on file   Highest education level: Not on file  Occupational History   Not on file  Tobacco Use   Smoking status: Former    Types: Cigarettes    Quit date: 06/29/2018    Years since quitting: 4.0    Passive exposure: Past   Smokeless tobacco: Never   Tobacco comments:    She smoked 10 cigarettes a day for 15 years , quit 2019.   Vaping Use   Vaping Use: Never used  Substance and Sexual Activity   Alcohol use: Yes    Comment: occa   Drug use: No   Sexual activity: Not Currently  Other Topics Concern   Not on file  Social History Narrative   Lives with her son.    Social Determinants of Health   Financial Resource Strain: Not on file  Food Insecurity: Not on file  Transportation Needs: Not on file  Physical Activity: Not on file  Stress: Not on file  Social Connections: Not on file    Family History  Problem Relation Age of Onset   Diabetes Mother    Hypertension Mother    Asthma Brother    Breast cancer Maternal Grandmother    Asthma Son    Colon cancer Neg Hx    Cancer - Lung Neg Hx    Cervical cancer Neg Hx     Outpatient Encounter Medications as of 06/29/2022  Medication Sig   aspirin EC 81 MG tablet Take 81 mg by mouth daily.   atorvastatin  (LIPITOR) 20 MG tablet Take 1 tablet (20 mg total) by mouth daily.   Blood Glucose Monitoring Suppl (FREESTYLE LITE) w/Device KIT Use to monitor glucose 4 times a day   busPIRone (BUSPAR) 5 MG tablet Take 1 tablet (5 mg total) by mouth 3 (three) times daily.   famotidine (PEPCID) 20 MG tablet Take 1 tablet (20 mg total) by mouth 2 (two) times daily.   glipiZIDE (GLUCOTROL XL) 5 MG 24 hr tablet Take 1 tablet (5 mg total) by mouth daily with breakfast.   glucose blood (FREESTYLE LITE) test strip Use to monitor glucose 4 times a day as instructed   insulin glargine-yfgn (SEMGLEE) 100 UNIT/ML Pen Inject 26 Units into the skin at bedtime.   Insulin Pen Needle (BD PEN NEEDLE NANO U/F) 32G X 4 MM MISC Once daily   Lancets (FREESTYLE) lancets Test blood glucose four times daily. Use as instructed  lisinopril (ZESTRIL) 10 MG tablet Take 1 tablet (10 mg total) by mouth daily.   [DISCONTINUED] insulin glargine (LANTUS SOLOSTAR) 100 UNIT/ML Solostar Pen Inject 24 Units into the skin at bedtime.   [DISCONTINUED] insulin glargine-yfgn (SEMGLEE) 100 UNIT/ML Pen Inject 30 Units into the skin at bedtime.   No facility-administered encounter medications on file as of 06/29/2022.    ALLERGIES: Allergies  Allergen Reactions   Amoxicillin    Shellfish Allergy     VACCINATION STATUS: Immunization History  Administered Date(s) Administered   Hepatitis B, adult 08/06/2019, 09/11/2019   Influenza,inj,Quad PF,6+ Mos 04/21/2022   Influenza,inj,quad, With Preservative 08/01/2019   Influenza-Unspecified 04/29/2021   Moderna Sars-Covid-2 Vaccination 04/02/2020   PFIZER(Purple Top)SARS-COV-2 Vaccination 08/01/2020   PPD Test 04/08/2020, 11/22/2021   Tdap 04/10/2015, 08/01/2019    Diabetes She presents for her follow-up diabetic visit. She has type 2 diabetes mellitus. Onset time: She was diagnosed at approximate age of 4 years.  She did have a gestational diabetes during her first pregnancy 19 years ago. Her  disease course has been improving. There are no hypoglycemic associated symptoms. Pertinent negatives for hypoglycemia include no confusion, headaches, pallor or seizures. Pertinent negatives for diabetes include no blurred vision, no chest pain, no fatigue, no polydipsia, no polyphagia and no polyuria. There are no hypoglycemic complications. Symptoms are worsening. There are no diabetic complications. Risk factors for coronary artery disease include diabetes mellitus and dyslipidemia. Current diabetic treatments: She is not tolerating her Metformin.  Currently taking Trulicity 5.17 mg subcutaneously weekly. Her weight is fluctuating minimally. She is following a generally unhealthy diet. When asked about meal planning, she reported none. She has not had a previous visit with a dietitian. She rarely participates in exercise. Her home blood glucose trend is decreasing steadily. Her breakfast blood glucose range is generally 130-140 mg/dl. Her bedtime blood glucose range is generally 180-200 mg/dl. Her overall blood glucose range is 180-200 mg/dl. (She presents with improved glycemic profile based on her meter showing 2 readings daily averaging 122 for the last 14 days.    Her recent point-of-care A1c is 10.3%, increasing from 7.9% . She denies any hypoglycemia.   ) An ACE inhibitor/angiotensin II receptor blocker is not being taken. Eye exam is not current.  Hyperlipidemia This is a chronic problem. The current episode started more than 1 year ago. The problem is uncontrolled. Exacerbating diseases include diabetes. Pertinent negatives include no chest pain, myalgias or shortness of breath. Current antihyperlipidemic treatment includes statins. Risk factors for coronary artery disease include diabetes mellitus, dyslipidemia and family history.    Review of Systems  Constitutional:  Negative for chills, fatigue, fever and unexpected weight change.  HENT:  Negative for trouble swallowing and voice change.    Eyes:  Negative for blurred vision and visual disturbance.  Respiratory:  Negative for cough, shortness of breath and wheezing.   Cardiovascular:  Negative for chest pain, palpitations and leg swelling.  Gastrointestinal:  Negative for diarrhea, nausea and vomiting.  Endocrine: Negative for cold intolerance, heat intolerance, polydipsia, polyphagia and polyuria.  Musculoskeletal:  Negative for arthralgias and myalgias.  Skin:  Negative for color change, pallor, rash and wound.  Neurological:  Negative for seizures and headaches.  Psychiatric/Behavioral:  Negative for confusion and suicidal ideas.     Objective:       06/29/2022    1:50 PM 06/08/2022    2:59 PM 04/21/2022    8:06 AM  Vitals with BMI  Height _0  _1  5'  2"  Weight 155 lbs 10 oz 155 lbs 6 oz 151 lbs  BMI 28.45 10.93 23.55  Systolic 732 202 542  Diastolic 78 88 67  Pulse 88 96 88    BP 122/78   Pulse 88   Ht _0  (1.575 m)   Wt 155 lb 9.6 oz (70.6 kg)   BMI 28.46 kg/m   Wt Readings from Last 3 Encounters:  06/29/22 155 lb 9.6 oz (70.6 kg)  06/08/22 155 lb 6.4 oz (70.5 kg)  04/21/22 151 lb (68.5 kg)        CMP ( most recent) CMP     Component Value Date/Time   NA 144 02/08/2022 0811   K 4.5 02/08/2022 0811   CL 110 (H) 02/08/2022 0811   CO2 19 (L) 02/08/2022 0811   GLUCOSE 165 (H) 02/08/2022 0811   GLUCOSE 152 (H) 09/16/2020 2043   BUN 26 (H) 02/08/2022 0811   CREATININE 0.88 02/08/2022 0811   CREATININE 0.66 05/19/2020 0750   CALCIUM 9.2 02/08/2022 0811   PROT 6.3 02/08/2022 0811   ALBUMIN 4.0 02/08/2022 0811   AST 31 02/08/2022 0811   ALT 49 (H) 02/08/2022 0811   ALKPHOS 74 02/08/2022 0811   BILITOT <0.2 02/08/2022 0811   GFRNONAA >60 09/16/2020 2043   GFRNONAA 109 05/19/2020 0750   GFRAA 126 05/19/2020 0750     Diabetic Labs (most recent): Lab Results  Component Value Date   HGBA1C 10.3 (A) 06/08/2022   HGBA1C 9.3 (A) 11/01/2021   HGBA1C 7.9 (A) 04/27/2021   MICROALBUR 150  10/20/2020   Recent Results (from the past 2160 hour(s))  HgB A1c     Status: Abnormal   Collection Time: 06/08/22  3:12 PM  Result Value Ref Range   Hemoglobin A1C     HbA1c POC (<> result, manual entry)     HbA1c, POC (prediabetic range)     HbA1c, POC (controlled diabetic range) 10.3 (A) 0.0 - 7.0 %    Lipid Panel     Component Value Date/Time   CHOL 155 02/08/2022 0811   TRIG 221 (H) 02/08/2022 0811   HDL 49 02/08/2022 0811   CHOLHDL 3.2 02/08/2022 0811   CHOLHDL 2.1 05/19/2020 0750   LDLCALC 70 02/08/2022 0811   LDLCALC 60 05/19/2020 0750   LABVLDL 36 02/08/2022 0811     Assessment & Plan:   1. Uncontrolled type 2 diabetes mellitus with hyperglycemia ([place])  - Khaliah Chappelle has currently uncontrolled symptomatic type 2 DM since 46 years of age. She presents with improved glycemic profile based on her meter showing 2 readings daily averaging 122 for the last 14 days.    Her recent point-of-care A1c is 10.3%, increasing from 7.9% . She denies any hypoglycemia.    - Recent labs reviewed. - I had a long discussion with her about the progressive nature of diabetes and the pathology behind its complications. -She did not report gross complications from her diabetes, however, she remains at a high risk for more acute and chronic complications which include CAD, CVA, CKD, retinopathy, and neuropathy. These are all discussed in detail with her.  - I have counseled her on diet  and weight management  by adopting a carbohydrate restricted/protein rich diet. Patient is encouraged to switch to  unprocessed or minimally processed     complex starch and increased protein intake (animal or plant source), fruits, and vegetables. -  she is advised to stick to a routine mealtimes to eat 3 meals  a day and avoid unnecessary snacks ( to snack only to correct hypoglycemia).  - she acknowledges that there is a room for improvement in her food and drink choices. - Suggestion is made for her to  avoid simple carbohydrates  from her diet including Cakes, Sweet Desserts, Ice Cream, Soda (diet and regular), Sweet Tea, Candies, Chips, Cookies, Store Bought Juices, Alcohol in Excess of  1-2 drinks a day, Artificial Sweeteners,  Coffee Creamer, and "Sugar-free" Products, Lemonade. This will help patient to have more stable blood glucose profile and potentially avoid unintended weight gain.  - she has been scheduled with Jearld Fenton, RDN, CDE for diabetes education.  - I have approached her with the following individualized plan to manage  her diabetes and patient agrees:   -She is approached to stay engaged for monitoring of blood glucose at least twice a day-daily before breakfast and at bedtime.    I discussed and lowered her Semglee to 26 units nightly, associated with monitoring of blood glucose as ordered.   Based on her presentation with significant improvement in her glycemic profile, she will not need prandial insulin for now.  -She worries about hypoglycemia and rightly so in light of the fact that she is having rare, random, mild hypoglycemia.    She is advised to continue glipizide 5 mg XL p.o. daily at breakfast.     - she is encouraged to call clinic for blood glucose levels less than 70 or above 200 mg /dl.  She did not tolerate metformin, did not afford Trulicity.  - Specific targets for  A1c;  LDL, HDL,  and Triglycerides were discussed with the patient.  2) Blood Pressure /Hypertension:  -Blood pressure is controlled to target. -She is advised to continue lisinopril 20 mg p.o. daily  at breakfast.  She also has urine microalbuminuria.     3) Lipids/Hyperlipidemia:   Review of her recent lipid panel showed improved LDL improving to 60 from 151.  She is advised to continue atorvastatin 20 mg p.o. nightly.    Side effects and precautions discussed with her.     4)  Weight/Diet:  Body mass index is 28.46 kg/m.   she is is a candidate for modest weight loss.    Exercise, and detailed carbohydrates information provided  -  detailed on discharge instructions.  5) Chronic Care/Health Maintenance:  -she  is on Statin medications and  is encouraged to initiate and continue to follow up with Ophthalmology, Dentist,  Podiatrist at least yearly or according to recommendations, and advised to   stay away from smoking. I have recommended yearly flu vaccine and pneumonia vaccine at least every 5 years; moderate intensity exercise for up to 150 minutes weekly; and  sleep for at least 7 hours a day.  - she is  advised to maintain close follow up with Alvira Monday, FNP for primary care needs, as well as her other providers for optimal and coordinated care.   I spent 26 minutes in the care of the patient today including review of labs from [place], Lipids, Thyroid Function, Hematology (current and previous including abstractions from other facilities); face-to-face time discussing  her blood glucose readings/logs, discussing hypoglycemia and hyperglycemia episodes and symptoms, medications doses, her options of short and long term treatment based on the latest standards of care / guidelines;  discussion about incorporating lifestyle medicine;  and documenting the encounter. Risk reduction counseling performed per USPSTF guidelines to reduce obesity and cardiovascular risk factors.  Please refer to Patient Instructions for Blood Glucose Monitoring and Insulin/Medications Dosing Guide"  in media tab for additional information. Please  also refer to " Patient Self Inventory" in the Media  tab for reviewed elements of pertinent patient history.  Zilpha Englander participated in the discussions, expressed understanding, and voiced agreement with the above plans.  All questions were answered to her satisfaction. she is encouraged to contact clinic should she have any questions or concerns prior to her return visit.     Follow up plan: - Return in about 3 months (around  09/29/2022) for F/U with Pre-visit Labs, Meter/CGM/Logs, A1c here.  Glade Lloyd, MD [facility] [address] [place], [place] [address] Phone: [phone]  Fax: [phone]    06/29/2022, 2:06 PM  This note was partially dictated with voice recognition software. Similar sounding words can be transcribed inadequately or may not  be corrected upon review.

## 2022-06-29 NOTE — Patient Instructions (Signed)
                                     Advice for Weight Management  -For most of us the best way to lose weight is by diet management. Generally speaking, diet management means consuming less calories intentionally which over time brings about progressive weight loss.  This can be achieved more effectively by avoiding ultra processed carbohydrates, processed meats, unhealthy fats.    It is critically important to know your numbers: how much calorie you are consuming and how much calorie you need. More importantly, our carbohydrates sources should be unprocessed naturally occurring  complex starch food items.  It is always important to balance nutrition also by  appropriate intake of proteins (mainly plant-based), healthy fats/oils, plenty of fruits and vegetables.   -The American College of Lifestyle Medicine (ACL M) recommends nutrition derived mostly from Whole Food, Plant Predominant Sources example an apple instead of applesauce or apple pie. Eat Plenty of vegetables, Mushrooms, fruits, Legumes, Whole Grains, Nuts, seeds in lieu of processed meats, processed snacks/pastries red meat, poultry, eggs.  Use only water or unsweetened tea for hydration.  The College also recommends the need to stay away from risky substances including alcohol, smoking; obtaining 7-9 hours of restorative sleep, at least 150 minutes of moderate intensity exercise weekly, importance of healthy social connections, and being mindful of stress and seek help when it is overwhelming.    -Sticking to a routine mealtime to eat 3 meals a day and avoiding unnecessary snacks is shown to have a big role in weight control. Under normal circumstances, the only time we burn stored energy is when we are hungry, so allow  some hunger to take place- hunger means no food between appropriate meal times, only water.  It is not advisable to starve.   -It is better to avoid simple carbohydrates including:  Cakes, Sweet Desserts, Ice Cream, Soda (diet and regular), Sweet Tea, Candies, Chips, Cookies, Store Bought Juices, Alcohol in Excess of  1-2 drinks a day, Lemonade,  Artificial Sweeteners, Doughnuts, Coffee Creamers, "Sugar-free" Products, etc, etc.  This is not a complete list.....    -Consulting with certified diabetes educators is proven to provide you with the most accurate and current information on diet.  Also, you may be  interested in discussing diet options/exchanges , we can schedule a visit with Anne Wright, RDN, CDE for individualized nutrition education.  -Exercise: If you are able: 30 -60 minutes a day ,4 days a week, or 150 minutes of moderate intensity exercise weekly.    The longer the better if tolerated.  Combine stretch, strength, and aerobic activities.  If you were told in the past that you have high risk for cardiovascular diseases, or if you are currently symptomatic, you may seek evaluation by your heart doctor prior to initiating moderate to intense exercise programs.                                  Additional Care Considerations for Diabetes/Prediabetes   -Diabetes  is a chronic disease.  The most important care consideration is regular follow-up with your diabetes care provider with the goal being avoiding or delaying its complications and to take advantage of advances in medications and technology.  If appropriate actions are taken early enough, type 2 diabetes can even be   reversed.  Seek information from the right source.  - Whole Food, Plant Predominant Nutrition is highly recommended: Eat Plenty of vegetables, Mushrooms, fruits, Legumes, Whole Grains, Nuts, seeds in lieu of processed meats, processed snacks/pastries red meat, poultry, eggs as recommended by American College of  Lifestyle Medicine (ACLM).  -Type 2 diabetes is known to coexist with other important comorbidities such as high blood pressure and high cholesterol.  It is critical to control not only the  diabetes but also the high blood pressure and high cholesterol to minimize and delay the risk of complications including coronary artery disease, stroke, amputations, blindness, etc.  The good news is that this diet recommendation for type 2 diabetes is also very helpful for managing high cholesterol and high blood blood pressure.  - Studies showed that people with diabetes will benefit from a class of medications known as ACE inhibitors and statins.  Unless there are specific reasons not to be on these medications, the standard of care is to consider getting one from these groups of medications at an optimal doses.  These medications are generally considered safe and proven to help protect the heart and the kidneys.    - People with diabetes are encouraged to initiate and maintain regular follow-up with eye doctors, foot doctors, dentists , and if necessary heart and kidney doctors.     - It is highly recommended that people with diabetes quit smoking or stay away from smoking, and get yearly  flu vaccine and pneumonia vaccine at least every 5 years.  See above for additional recommendations on exercise, sleep, stress management , and healthy social connections.      

## 2022-07-04 ENCOUNTER — Other Ambulatory Visit (HOSPITAL_COMMUNITY): Payer: Self-pay

## 2022-08-02 ENCOUNTER — Telehealth: Payer: Self-pay | Admitting: "Endocrinology

## 2022-08-02 NOTE — Telephone Encounter (Signed)
No answer no VM

## 2022-08-02 NOTE — Telephone Encounter (Signed)
Pt said at her last visit you looked at her right foot. She said now it is a open wound like blister. Do you want her to go to her PCP, because she is asking to see you

## 2022-08-03 NOTE — Telephone Encounter (Signed)
F/u   Patient called back aware of Dr. Dorris Fetch recommendation to contacted her PCP.    Patient verbalized understanding

## 2022-08-05 ENCOUNTER — Ambulatory Visit (INDEPENDENT_AMBULATORY_CARE_PROVIDER_SITE_OTHER): Payer: 59 | Admitting: Family Medicine

## 2022-08-05 ENCOUNTER — Encounter: Payer: Self-pay | Admitting: Family Medicine

## 2022-08-05 ENCOUNTER — Other Ambulatory Visit (HOSPITAL_COMMUNITY): Payer: Self-pay

## 2022-08-05 VITALS — BP 125/83 | HR 95 | Ht 62.0 in | Wt 144.0 lb

## 2022-08-05 DIAGNOSIS — E1165 Type 2 diabetes mellitus with hyperglycemia: Secondary | ICD-10-CM | POA: Diagnosis not present

## 2022-08-05 DIAGNOSIS — L84 Corns and callosities: Secondary | ICD-10-CM

## 2022-08-05 MED ORDER — FREESTYLE LITE TEST VI STRP
ORAL_STRIP | Freq: Four times a day (QID) | 2 refills | Status: DC
  Filled 2022-08-05: qty 200, 50d supply, fill #0
  Filled 2022-08-29: qty 200, 50d supply, fill #1

## 2022-08-05 MED ORDER — BD PEN NEEDLE NANO U/F 32G X 4 MM MISC
Freq: Every day | 3 refills | Status: DC
  Filled 2022-08-05: qty 100, 90d supply, fill #0

## 2022-08-05 NOTE — Progress Notes (Unsigned)
Acute Office Visit  Subjective:    Patient ID: Anne Wright, female    DOB: 1976/03/21, 47 y.o.   MRN: [id number]  Chief Complaint  Patient presents with   Foot Problem    Pt reports a spot on her right foot on the side, started off as a callus around 08/05/2021 about a year ago,  feels uncomfortable about 3 days ago spot opened up, has only been applying hydrogen peroxide on it.     HPI Patient is in today with complaints of callus under metatarsal head of her right fifth toe.  Onset of symptoms on 08/05/2021.  She denies pain with ambulation.  Range of motion of the right foot is intact.  She voices skin peeling and reports using hydrogen peroxide on the affected site.  She denies signs of infection and inflammation.  Past Medical History:  Diagnosis Date   Diabetes mellitus without complication (HCC)    High cholesterol    Hypertension     History reviewed. No pertinent surgical history.  Family History  Problem Relation Age of Onset   Diabetes Mother    Hypertension Mother    Asthma Brother    Breast cancer Maternal Grandmother    Asthma Son    Colon cancer Neg Hx    Cancer - Lung Neg Hx    Cervical cancer Neg Hx     Social History   Socioeconomic History   Marital status: Married    Spouse name: Not on file   Number of children: 2   Years of education: Not on file   Highest education level: Not on file  Occupational History   Not on file  Tobacco Use   Smoking status: Former    Types: Cigarettes    Quit date: 06/29/2018    Years since quitting: 4.1    Passive exposure: Past   Smokeless tobacco: Never   Tobacco comments:    She smoked 10 cigarettes a day for 15 years , quit 2019.   Vaping Use   Vaping Use: Never used  Substance and Sexual Activity   Alcohol use: Yes    Comment: occa   Drug use: No   Sexual activity: Not Currently  Other Topics Concern   Not on file  Social History Narrative   Lives with her son.    Social Determinants of Health    Financial Resource Strain: Not on file  Food Insecurity: Not on file  Transportation Needs: Not on file  Physical Activity: Not on file  Stress: Not on file  Social Connections: Not on file  Intimate Partner Violence: Not on file    Outpatient Medications Prior to Visit  Medication Sig Dispense Refill   aspirin EC 81 MG tablet Take 81 mg by mouth daily.     atorvastatin (LIPITOR) 20 MG tablet Take 1 tablet (20 mg total) by mouth daily. 90 tablet 1   Blood Glucose Monitoring Suppl (FREESTYLE LITE) w/Device KIT Use to monitor glucose 4 times a day 1 kit 0   busPIRone (BUSPAR) 5 MG tablet Take 1 tablet (5 mg total) by mouth 3 (three) times daily. 90 tablet 1   famotidine (PEPCID) 20 MG tablet Take 1 tablet (20 mg total) by mouth 2 (two) times daily. 60 tablet 11   glipiZIDE (GLUCOTROL XL) 5 MG 24 hr tablet Take 1 tablet (5 mg total) by mouth daily with breakfast. 30 tablet 2   insulin glargine-yfgn (SEMGLEE) 100 UNIT/ML Pen Inject 26 Units into the skin  at bedtime. 15 mL 1   Lancets (FREESTYLE) lancets Test blood glucose four times daily. Use as instructed 100 each 12   lisinopril (ZESTRIL) 10 MG tablet Take 1 tablet (10 mg total) by mouth daily. 90 tablet 3   glucose blood (FREESTYLE LITE) test strip Use to monitor glucose 4 times a day as instructed 150 each 2   Insulin Pen Needle (BD PEN NEEDLE NANO U/F) 32G X 4 MM MISC Once daily 100 each 3   No facility-administered medications prior to visit.    Allergies  Allergen Reactions   Amoxicillin    Shellfish Allergy     Review of Systems  Constitutional:  Negative for chills and fever.  Eyes:  Negative for visual disturbance.  Respiratory:  Negative for chest tightness and shortness of breath.   Musculoskeletal:        Spot on her right foot  Neurological:  Negative for dizziness and headaches.       Objective:    Physical Exam HENT:     Head: Normocephalic.     Mouth/Throat:     Mouth: Mucous membranes are moist.   Cardiovascular:     Rate and Rhythm: Normal rate.     Heart sounds: Normal heart sounds.  Pulmonary:     Effort: Pulmonary effort is normal.     Breath sounds: Normal breath sounds.  Musculoskeletal:     Comments: callus under metatarsal head of her right fifth toe  Neurological:     Mental Status: She is alert.     BP 125/83   Pulse 95   Ht 5\' 2"  (1.575 m)   Wt 144 lb 0.6 oz (65.3 kg)   SpO2 98%   BMI 26.35 kg/m  Wt Readings from Last 3 Encounters:  08/05/22 144 lb 0.6 oz (65.3 kg)  06/29/22 155 lb 9.6 oz (70.6 kg)  06/08/22 155 lb 6.4 oz (70.5 kg)       Assessment & Plan:  Callus under metatarsal head Assessment & Plan: callus under metatarsal head of her right fifth toe Darkened discoloration of the skin at the affected site Encourage use of home remedies Home treatment includes: -Soak the corn or callus in warm water. Do this for about five to 10 minutes or until the skin softens. -File the corn or callus with a pumice stone. First dip the pumice stone in warm water, and then use the stone to gently file the corn or callus. Use circular or sideways motions to remove dead skin. -Be careful not to take off too much skin. Doing so could cause bleeding and infection Please continue to a heart-healthy diet and increase your physical activities. Try to exercise for 13/08/23 at least three times a week.  -Apply moisturizing lotion or cream to the area daily. Look for a moisturizing lotion or cream with salicylic acid, ammonium lactate, or urea. These ingredients will help gradually soften hard corns and calluses. -Use padding. To protect calluses from further irritation during activity, cut a piece of moleskin - available at your local drugstore - into two half-moon shapes and place around the callus.   Referral placed to podiatry   Orders: -     Ambulatory referral to Podiatry  Uncontrolled type 2 diabetes mellitus with hyperglycemia (HCC) -     FreeStyle Lite Test; Use  to monitor glucose 4 times a day as instructed  Dispense: 150 each; Refill: 2 -     BD Pen Needle Nano U/F; Use once daily  Dispense:  100 each; Refill: 3 -     Microalbumin / creatinine urine ratio    Alvira Monday, FNP

## 2022-08-05 NOTE — Patient Instructions (Addendum)
I appreciate the opportunity to provide care to you today!    Follow up:  3 months   Home treatment includes: -Soak the corn or callus in warm water. Do this for about five to 10 minutes or until the skin softens. -File the corn or callus with a pumice stone. First dip the pumice stone in warm water, and then use the stone to gently file the corn or callus. Use circular or sideways motions to remove dead skin. -Be careful not to take off too much skin. Doing so could cause bleeding and infection Please continue to a heart-healthy diet and increase your physical activities. Try to exercise for 10mins at least three times a week.  -Apply moisturizing lotion or cream to the area daily. Look for a moisturizing lotion or cream with salicylic acid, ammonium lactate, or urea. These ingredients will help gradually soften hard corns and calluses. -Use padding. To protect calluses from further irritation during activity, cut a piece of moleskin - available at your local drugstore - into two half-moon shapes and place around the callus.    Referrals today- podiatry     It was a pleasure to see you and I look forward to continuing to work together on your health and well-being. Please do not hesitate to call the office if you need care or have questions about your care.   Have a wonderful day and week. With Gratitude, Alvira Monday MSN, FNP-BC

## 2022-08-06 DIAGNOSIS — L84 Corns and callosities: Secondary | ICD-10-CM | POA: Insufficient documentation

## 2022-08-06 NOTE — Assessment & Plan Note (Signed)
callus under metatarsal head of her right fifth toe Darkened discoloration of the skin at the affected site Encourage use of home remedies Home treatment includes: -Soak the corn or callus in warm water. Do this for about five to 10 minutes or until the skin softens. -File the corn or callus with a pumice stone. First dip the pumice stone in warm water, and then use the stone to gently file the corn or callus. Use circular or sideways motions to remove dead skin. -Be careful not to take off too much skin. Doing so could cause bleeding and infection Please continue to a heart-healthy diet and increase your physical activities. Try to exercise for 69mins at least three times a week.  -Apply moisturizing lotion or cream to the area daily. Look for a moisturizing lotion or cream with salicylic acid, ammonium lactate, or urea. These ingredients will help gradually soften hard corns and calluses. -Use padding. To protect calluses from further irritation during activity, cut a piece of moleskin - available at your local drugstore - into two half-moon shapes and place around the callus.   Referral placed to podiatry

## 2022-08-29 ENCOUNTER — Other Ambulatory Visit: Payer: Self-pay | Admitting: "Endocrinology

## 2022-08-29 ENCOUNTER — Other Ambulatory Visit (HOSPITAL_COMMUNITY): Payer: Self-pay

## 2022-08-29 ENCOUNTER — Other Ambulatory Visit: Payer: Self-pay

## 2022-08-29 DIAGNOSIS — E1165 Type 2 diabetes mellitus with hyperglycemia: Secondary | ICD-10-CM

## 2022-08-30 ENCOUNTER — Other Ambulatory Visit (HOSPITAL_COMMUNITY): Payer: Self-pay

## 2022-08-30 MED ORDER — INSULIN GLARGINE-YFGN 100 UNIT/ML ~~LOC~~ SOPN
26.0000 [IU] | PEN_INJECTOR | Freq: Every day | SUBCUTANEOUS | 0 refills | Status: DC
  Filled 2022-08-30: qty 15, 57d supply, fill #0

## 2022-09-02 ENCOUNTER — Other Ambulatory Visit (HOSPITAL_COMMUNITY): Payer: Self-pay

## 2022-09-07 ENCOUNTER — Ambulatory Visit (INDEPENDENT_AMBULATORY_CARE_PROVIDER_SITE_OTHER): Payer: 59 | Admitting: Internal Medicine

## 2022-09-07 ENCOUNTER — Encounter: Payer: Self-pay | Admitting: Internal Medicine

## 2022-09-07 ENCOUNTER — Other Ambulatory Visit (HOSPITAL_COMMUNITY): Payer: Self-pay

## 2022-09-07 VITALS — BP 150/96 | HR 88 | Temp 97.1°F | Ht 62.0 in | Wt 147.3 lb

## 2022-09-07 DIAGNOSIS — R7989 Other specified abnormal findings of blood chemistry: Secondary | ICD-10-CM

## 2022-09-07 DIAGNOSIS — K219 Gastro-esophageal reflux disease without esophagitis: Secondary | ICD-10-CM | POA: Diagnosis not present

## 2022-09-07 DIAGNOSIS — R1319 Other dysphagia: Secondary | ICD-10-CM

## 2022-09-07 DIAGNOSIS — Z1211 Encounter for screening for malignant neoplasm of colon: Secondary | ICD-10-CM | POA: Diagnosis not present

## 2022-09-07 MED ORDER — OMEPRAZOLE 40 MG PO CPDR
40.0000 mg | DELAYED_RELEASE_CAPSULE | Freq: Two times a day (BID) | ORAL | 11 refills | Status: DC
  Filled 2022-09-07: qty 60, 30d supply, fill #0

## 2022-09-07 NOTE — Progress Notes (Signed)
Primary Care Physician:  Alvira Monday, [facility] Primary Gastroenterologist:  Dr. Abbey Chatters  Chief Complaint  Patient presents with   Follow-up    Patient here today for a follow up on her elevated LFT'S. She says she has had some issues with diarrhea, that often alternates with constipation. Has issues also with reflux and takes famotidine 20 mg bid.Denies any other current gi issues.    HPI:   Anne Wright is a 47 y.o. female who presents to the clinic today for follow up visit. Previously seen for fatty liver, abnormal LFTs  Routine blood work showed slightly abnormal liver function test.    09/07/2021 AST 45, ALT 71, T. bili 0.2, alk phos 72 10/22/2021 AST 28, ALT 53, T. bili 0.3, alk phos 71 02/08/2022 AST 31, ALT 49, T. bili 0.2, alk phos 74  Patient denies any chronic alcohol use.  Does state she used to drink alcohol previously.  No family history of liver disease.  No new medications.  No family history of autoimmune disorders.  No exposure to viral hepatitis that she is aware of.  Does has risk factors for fatty liver disease including diabetes and dyslipidemia.  BMI 28.  Full serological workup negative from a liver standpoint.   [place] with elastography 03/15/22 WNL.   No family history of colorectal malignancy.  Seen for chronic GERD. Started her on famotidine 20 mg BID on previous visit.  Continues to have mild to moderate reflux symptoms.  Occasional dysphagia with foods and liquids in her substernal region.  Past Medical History:  Diagnosis Date   Diabetes mellitus without complication (HCC)    High cholesterol    Hypertension     History reviewed. No pertinent surgical history.  Current Outpatient Medications  Medication Sig Dispense Refill   aspirin EC 81 MG tablet Take 81 mg by mouth daily.     atorvastatin (LIPITOR) 20 MG tablet Take 1 tablet (20 mg total) by mouth daily. 90 tablet 1   Blood Glucose Monitoring Suppl (FREESTYLE LITE) w/Device KIT Use to monitor  glucose 4 times a day 1 kit 0   busPIRone (BUSPAR) 5 MG tablet Take 1 tablet (5 mg total) by mouth 3 (three) times daily. 90 tablet 1   famotidine (PEPCID) 20 MG tablet Take 1 tablet (20 mg total) by mouth 2 (two) times daily. 60 tablet 11   glipiZIDE (GLUCOTROL XL) 5 MG 24 hr tablet Take 1 tablet (5 mg total) by mouth daily with breakfast. 30 tablet 2   glucose blood (FREESTYLE LITE) test strip Use to monitor glucose 4 times a day as instructed 150 each 2   insulin glargine-yfgn (SEMGLEE) 100 UNIT/ML Pen Inject 26 Units into the skin at bedtime. 15 mL 1   Insulin Pen Needle (BD PEN NEEDLE NANO U/F) 32G X 4 MM MISC Use once daily 100 each 3   Lancets (FREESTYLE) lancets Test blood glucose four times daily. Use as instructed 100 each 12   lisinopril (ZESTRIL) 10 MG tablet Take 1 tablet (10 mg total) by mouth daily. 90 tablet 3   No current facility-administered medications for this visit.    Allergies as of 09/07/2022 - Review Complete 09/07/2022  Allergen Reaction Noted   Amoxicillin  02/07/2020   Shellfish allergy  01/06/2020    Family History  Problem Relation Age of Onset   Diabetes Mother    Hypertension Mother    Asthma Brother    Breast cancer Maternal Grandmother    Asthma Son  Colon cancer Neg Hx    Cancer - Lung Neg Hx    Cervical cancer Neg Hx     Social History   Socioeconomic History   Marital status: Married    Spouse name: Not on file   Number of children: 2   Years of education: Not on file   Highest education level: Not on file  Occupational History   Not on file  Tobacco Use   Smoking status: Former    Types: Cigarettes    Quit date: 06/29/2018    Years since quitting: 4.1    Passive exposure: Past   Smokeless tobacco: Never   Tobacco comments:    She smoked 10 cigarettes a day for 15 years , quit 2019.   Vaping Use   Vaping Use: Never used  Substance and Sexual Activity   Alcohol use: Yes    Comment: occa   Drug use: No   Sexual activity:  Not Currently  Other Topics Concern   Not on file  Social History Narrative   Lives with her son.    Social Determinants of Health   Financial Resource Strain: Not on file  Food Insecurity: Not on file  Transportation Needs: Not on file  Physical Activity: Not on file  Stress: Not on file  Social Connections: Not on file  Intimate Partner Violence: Not on file    Subjective: Review of Systems  Constitutional:  Negative for chills and fever.  HENT:  Negative for congestion and hearing loss.   Eyes:  Negative for blurred vision and double vision.  Respiratory:  Negative for cough and shortness of breath.   Cardiovascular:  Negative for chest pain and palpitations.  Gastrointestinal:  Positive for heartburn. Negative for abdominal pain, blood in stool, constipation, diarrhea, melena and vomiting.  Genitourinary:  Negative for dysuria and urgency.  Musculoskeletal:  Negative for joint pain and myalgias.  Skin:  Negative for itching and rash.  Neurological:  Negative for dizziness and headaches.  Psychiatric/Behavioral:  Negative for depression. The patient is not nervous/anxious.        Objective: BP (!) 165/101 (BP Location: Left Arm, Patient Position: Sitting, Cuff Size: Small)   Pulse 93   Temp (!) 97.1 F (36.2 C) (Temporal)   Ht 5\' 2"  (1.575 m)   Wt 147 lb 4.8 oz (66.8 kg)   BMI 26.94 kg/m  Physical Exam Constitutional:      Appearance: Normal appearance.  HENT:     Head: Normocephalic and atraumatic.  Eyes:     Extraocular Movements: Extraocular movements intact.     Conjunctiva/sclera: Conjunctivae normal.  Cardiovascular:     Rate and Rhythm: Normal rate and regular rhythm.  Pulmonary:     Effort: Pulmonary effort is normal.     Breath sounds: Normal breath sounds.  Abdominal:     General: Bowel sounds are normal.     Palpations: Abdomen is soft.  Musculoskeletal:        General: No swelling. Normal range of motion.     Cervical back: Normal range of  motion and neck supple.  Skin:    General: Skin is warm and dry.     Coloration: Skin is not jaundiced.  Neurological:     General: No focal deficit present.     Mental Status: She is alert and oriented to person, place, and time.  Psychiatric:        Mood and Affect: Mood normal.        Behavior: Behavior  normal.      Assessment: *Chronic GERD *Esophageal dysphagia-intermittent *Abnormal aminotransferases-mild *Colon cancer screening  Plan: Chronic GERD not well-controlled on famotidine twice daily.  Will start on omeprazole 40 mg twice daily for 12 weeks at which point she can decrease down to once daily.  Offered upper endoscopy today that she would like to hold off for now and try medications first.  She will be due for screening colonoscopy August 2024.  If she still having symptoms, we can consider EGD at the same time with possible dilation.  Most recent LFTs have normalized.  Full serological workup unremarkable.  Ultrasound with elastography WNL.  Continue to monitor.  Follow-up in 3 to 4 months.  09/07/2022 1:36 PM   Disclaimer: This note was dictated with voice recognition software. Similar sounding words can inadvertently be transcribed and may not be corrected upon review.

## 2022-09-07 NOTE — Patient Instructions (Signed)
For your chronic reflux, I am going to start you on omeprazole 40 mg twice daily.  I want you to take this twice daily for at least 12 weeks at which point you can decrease down to once daily.  You can take famotidine on top of this as needed for breakthrough symptoms.  Follow-up in 3 to 4 months.  We may need to pursue upper endoscopy to further evaluate your symptoms.  We will tentatively plan on this in August at the same time as your first screening colonoscopy.  It was very nice seeing you again today.  Dr. Abbey Chatters

## 2022-09-19 ENCOUNTER — Encounter: Payer: Commercial Managed Care - PPO | Admitting: Nurse Practitioner

## 2022-09-20 ENCOUNTER — Encounter: Payer: Commercial Managed Care - PPO | Admitting: Nurse Practitioner

## 2022-09-20 ENCOUNTER — Encounter: Payer: Commercial Managed Care - PPO | Admitting: Family Medicine

## 2022-09-20 ENCOUNTER — Other Ambulatory Visit (HOSPITAL_COMMUNITY): Payer: Self-pay

## 2022-09-22 ENCOUNTER — Ambulatory Visit (INDEPENDENT_AMBULATORY_CARE_PROVIDER_SITE_OTHER): Payer: 59 | Admitting: Family Medicine

## 2022-09-22 ENCOUNTER — Encounter: Payer: Self-pay | Admitting: Family Medicine

## 2022-09-22 VITALS — BP 129/85 | HR 92 | Ht 62.0 in | Wt 154.0 lb

## 2022-09-22 DIAGNOSIS — E559 Vitamin D deficiency, unspecified: Secondary | ICD-10-CM

## 2022-09-22 DIAGNOSIS — E0789 Other specified disorders of thyroid: Secondary | ICD-10-CM

## 2022-09-22 DIAGNOSIS — I10 Essential (primary) hypertension: Secondary | ICD-10-CM

## 2022-09-22 DIAGNOSIS — Z0001 Encounter for general adult medical examination with abnormal findings: Secondary | ICD-10-CM | POA: Diagnosis not present

## 2022-09-22 DIAGNOSIS — E782 Mixed hyperlipidemia: Secondary | ICD-10-CM

## 2022-09-22 DIAGNOSIS — E1165 Type 2 diabetes mellitus with hyperglycemia: Secondary | ICD-10-CM

## 2022-09-22 DIAGNOSIS — M65331 Trigger finger, right middle finger: Secondary | ICD-10-CM

## 2022-09-22 NOTE — Assessment & Plan Note (Deleted)
She reports that her right middle finger bends and gets gets stuck then snaps straight Onset of symptoms a year ago Inform the patient this is called trigger finger Recommended conservative management Will continue to monitor

## 2022-09-22 NOTE — Assessment & Plan Note (Signed)
She reports that her right middle finger bends and gets stuck, then snaps straight Onset of symptoms a year ago Inform the patient this is called trigger finger Recommended conservative management Will continue to monitor

## 2022-09-22 NOTE — Assessment & Plan Note (Signed)
Physical exam as documented Counseling is done on healthy lifestyle involving commitment to 150 minutes of exercise per week,  Discussed heart-healthy diet  Will follow-up in a year for CPE

## 2022-09-22 NOTE — Progress Notes (Signed)
Complete physical exam  Patient: Anne Wright   DOB: May 16, 1976   47 y.o. Female  MRN: [id number]  Subjective:    Chief Complaint  Patient presents with   Annual Exam    Cpe today.     Rickey Schechter is a 47 y.o. female who presents today for a complete physical exam. She reports consuming a general diet.  She reports walking daily.  She generally feels well. She reports sleeping well. She does not have additional problems to discuss today.    Most recent fall risk assessment:    09/22/2022    8:32 AM  Fall Risk   Falls in the past year? 0  Number falls in past yr: 0  Injury with Fall? 0  Risk for fall due to : No Fall Risks  Follow up Falls evaluation completed     Most recent depression screenings:    09/22/2022    8:32 AM 08/05/2022    3:36 PM  PHQ 2/9 Scores  PHQ - 2 Score 2 4  PHQ- 9 Score 6 15    Dental: No current dental problems and Last dental visit: a year ago  Patient Active Problem List   Diagnosis Date Noted   Trigger finger, right middle finger 09/22/2022   Callus under metatarsal head 08/06/2022   Need for immunization against influenza 04/21/2022   Non-adherence to medical treatment 11/01/2021   Encounter for annual general medical examination with abnormal findings in adult 09/16/2021   Elevated liver enzymes 09/16/2021   Bacterial vaginosis 09/15/2021   Smoker 09/15/2021   Ingrown nail of great toe 09/07/2021   Dizziness 09/07/2021   Finger pain, right 09/07/2021   Vitamin D deficiency 06/05/2020   Essential hypertension, benign 01/20/2020   Mixed hyperlipidemia 01/06/2020   Anxiety 11/14/2019   Depressive disorder 11/14/2019   Uncontrolled type 2 diabetes mellitus with hyperglycemia ([place]) 10/08/2018   Past Medical History:  Diagnosis Date   Diabetes mellitus without complication (HCC)    High cholesterol    Hypertension    History reviewed. No pertinent surgical history. Social History   Tobacco Use   Smoking status: Former     Types: Cigarettes    Quit date: 06/29/2018    Years since quitting: 4.2    Passive exposure: Past   Smokeless tobacco: Never   Tobacco comments:    She smoked 10 cigarettes a day for 15 years , quit 2019.   Vaping Use   Vaping Use: Never used  Substance Use Topics   Alcohol use: Yes    Comment: occa   Drug use: No   Social History   Socioeconomic History   Marital status: Married    Spouse name: Not on file   Number of children: 2   Years of education: Not on file   Highest education level: Not on file  Occupational History   Not on file  Tobacco Use   Smoking status: Former    Types: Cigarettes    Quit date: 06/29/2018    Years since quitting: 4.2    Passive exposure: Past   Smokeless tobacco: Never   Tobacco comments:    She smoked 10 cigarettes a day for 15 years , quit 2019.   Vaping Use   Vaping Use: Never used  Substance and Sexual Activity   Alcohol use: Yes    Comment: occa   Drug use: No   Sexual activity: Not Currently  Other Topics Concern   Not on file  Social History  Narrative   Lives with her son.    Social Determinants of Health   Financial Resource Strain: Not on file  Food Insecurity: Not on file  Transportation Needs: Not on file  Physical Activity: Not on file  Stress: Not on file  Social Connections: Not on file  Intimate Partner Violence: Not on file   Family Status  Relation Name Status   Mother  Alive   Father  Deceased   Sister  Deceased   Brother  Alive   MGM  Deceased   Son  Alive   Neg Hx  (Not Specified)   Family History  Problem Relation Age of Onset   Diabetes Mother    Hypertension Mother    Asthma Brother    Breast cancer Maternal Grandmother    Asthma Son    Colon cancer Neg Hx    Cancer - Lung Neg Hx    Cervical cancer Neg Hx    Allergies  Allergen Reactions   Amoxicillin    Shellfish Allergy       Patient Care Team: Alvira Monday, FNP as PCP - General (Family Medicine)   Outpatient Medications  Prior to Visit  Medication Sig   aspirin EC 81 MG tablet Take 81 mg by mouth daily.   atorvastatin (LIPITOR) 20 MG tablet Take 1 tablet (20 mg total) by mouth daily.   Blood Glucose Monitoring Suppl (FREESTYLE LITE) w/Device KIT Use to monitor glucose 4 times a day   busPIRone (BUSPAR) 5 MG tablet Take 1 tablet (5 mg total) by mouth 3 (three) times daily.   glipiZIDE (GLUCOTROL XL) 5 MG 24 hr tablet Take 1 tablet (5 mg total) by mouth daily with breakfast.   glucose blood (FREESTYLE LITE) test strip Use to monitor glucose 4 times a day as instructed   insulin glargine-yfgn (SEMGLEE) 100 UNIT/ML Pen Inject 26 Units into the skin at bedtime.   Insulin Pen Needle (BD PEN NEEDLE NANO U/F) 32G X 4 MM MISC Use once daily   Lancets (FREESTYLE) lancets Test blood glucose four times daily. Use as instructed   lisinopril (ZESTRIL) 10 MG tablet Take 1 tablet (10 mg total) by mouth daily.   omeprazole (PRILOSEC) 40 MG capsule Take 1 capsule (40 mg total) by mouth in the morning and at bedtime.   No facility-administered medications prior to visit.    Review of Systems  Constitutional:  Negative for chills, fever and malaise/fatigue.  HENT:  Negative for congestion and sinus pain.   Eyes:  Negative for pain, discharge and redness.  Respiratory:  Negative for cough, sputum production and shortness of breath.   Cardiovascular:  Negative for chest pain, palpitations, claudication and leg swelling.  Gastrointestinal:  Negative for diarrhea, heartburn and nausea.  Genitourinary:  Negative for flank pain and frequency.  Musculoskeletal:  Negative for back pain and joint pain.  Skin:  Negative for itching.  Neurological:  Negative for dizziness, seizures and headaches.  Endo/Heme/Allergies:  Negative for environmental allergies.  Psychiatric/Behavioral:  Negative for memory loss. The patient does not have insomnia.        Objective:    BP 129/85   Pulse 92   Ht 5' 2"$  (1.575 m)   Wt 154 lb 0.6 oz  (69.9 kg)   SpO2 98%   BMI 28.17 kg/m  BP Readings from Last 3 Encounters:  09/22/22 129/85  09/07/22 (!) 150/96  08/05/22 125/83   Wt Readings from Last 3 Encounters:  09/22/22 154 lb 0.6 oz (69.9  kg)  09/07/22 147 lb 4.8 oz (66.8 kg)  08/05/22 144 lb 0.6 oz (65.3 kg)      Physical Exam HENT:     Head: Normocephalic.     Left Ear: External ear normal.     Mouth/Throat:     Mouth: Mucous membranes are moist.  Eyes:     Extraocular Movements: Extraocular movements intact.     Pupils: Pupils are equal, round, and reactive to light.  Cardiovascular:     Rate and Rhythm: Normal rate and regular rhythm.     Heart sounds: No murmur heard. Pulmonary:     Effort: Pulmonary effort is normal.     Breath sounds: Normal breath sounds.  Abdominal:     Palpations: Abdomen is soft.     Tenderness: There is no right CVA tenderness or left CVA tenderness.  Musculoskeletal:     Right lower leg: No edema.     Left lower leg: No edema.     Comments: Trigger finger of the right middle finger  Skin:    Findings: No lesion or rash.  Neurological:     Mental Status: She is alert and oriented to person, place, and time.     GCS: GCS eye subscore is 4. GCS verbal subscore is 5. GCS motor subscore is 6.     Cranial Nerves: No facial asymmetry.     Sensory: No sensory deficit.     Motor: No weakness.     Coordination: Coordination normal. Finger-Nose-Finger Test normal.     Gait: Gait normal.  Psychiatric:        Judgment: Judgment normal.     No results found for any visits on 09/22/22. Last CBC Lab Results  Component Value Date   WBC 7.7 09/07/2021   HGB 12.1 09/07/2021   HCT 36.0 09/07/2021   MCV 93 09/07/2021   MCH 31.2 09/07/2021   RDW 13.0 09/07/2021   PLT 272 [id number]   Last metabolic panel Lab Results  Component Value Date   GLUCOSE 165 (H) 02/08/2022   NA 144 02/08/2022   K 4.5 02/08/2022   CL 110 (H) 02/08/2022   CO2 19 (L) 02/08/2022   BUN 26 (H) 02/08/2022    CREATININE 0.88 02/08/2022   EGFR 84 02/08/2022   CALCIUM 9.2 02/08/2022   PROT 6.3 02/08/2022   ALBUMIN 4.0 02/08/2022   LABGLOB 2.3 02/08/2022   AGRATIO 1.7 02/08/2022   BILITOT <0.2 02/08/2022   ALKPHOS 74 02/08/2022   AST 31 02/08/2022   ALT 49 (H) 02/08/2022   ANIONGAP 7 09/16/2020   Last lipids Lab Results  Component Value Date   CHOL 155 02/08/2022   HDL 49 02/08/2022   LDLCALC 70 02/08/2022   TRIG 221 (H) 02/08/2022   CHOLHDL 3.2 02/08/2022   Last hemoglobin A1c Lab Results  Component Value Date   HGBA1C 10.3 (A) 06/08/2022   Last thyroid functions Lab Results  Component Value Date   TSH 1.590 04/21/2021   Last vitamin D Lab Results  Component Value Date   VD25OH 12.8 (L) 09/07/2021   Last vitamin B12 and Folate Lab Results  Component Value Date   VITAMINB12 868 09/07/2021   FOLATE 7.5 09/07/2021        Assessment & Plan:    Routine Health Maintenance and Physical Exam  Immunization History  Administered Date(s) Administered   Hepatitis B, ADULT 08/06/2019, 09/11/2019   Influenza,inj,Quad PF,6+ Mos 04/21/2022   Influenza,inj,quad, With Preservative 08/01/2019   Influenza-Unspecified 04/29/2021  Moderna Sars-Covid-2 Vaccination 04/02/2020   PFIZER(Purple Top)SARS-COV-2 Vaccination 08/01/2020   PPD Test 04/08/2020, 11/22/2021   Tdap 04/10/2015, 08/01/2019    Health Maintenance  Topic Date Due   PAP SMEAR-Modifier  11/14/2022   OPHTHALMOLOGY EXAM  11/09/2022   HEMOGLOBIN A1C  12/07/2022   Diabetic kidney evaluation - eGFR measurement  02/09/2023   Diabetic kidney evaluation - Urine ACR  02/09/2023   FOOT EXAM  09/23/2023   DTaP/Tdap/Td (3 - Td or Tdap) 07/31/2029   INFLUENZA VACCINE  Completed   Hepatitis C Screening  Completed   HIV Screening  Completed   HPV VACCINES  Aged Out   COVID-19 Vaccine  Discontinued    Discussed health benefits of physical activity, and encouraged her to engage in regular exercise appropriate for her  age and condition.  Encounter for annual general medical examination with abnormal findings in adult Assessment & Plan: Physical exam as documented Counseling is done on healthy lifestyle involving commitment to 150 minutes of exercise per week,  Discussed heart-healthy diet  Will follow-up in a year for CPE       Trigger finger, right middle finger Assessment & Plan: She reports that her right middle finger bends and gets stuck, then snaps straight Onset of symptoms a year ago Inform the patient this is called trigger finger Recommended conservative management Will continue to monitor   Uncontrolled type 2 diabetes mellitus with hyperglycemia (HCC) -     Hemoglobin A1c -     HM Diabetes Foot Exam  Essential hypertension, benign -     CBC with Differential/Platelet -     CMP14+EGFR  Mixed hyperlipidemia -     Lipid panel  Vitamin D deficiency -     VITAMIN D 25 Hydroxy (Vit-D Deficiency, Fractures)  Other specified disorders of thyroid -     TSH + free T4    Return in about 1 year (around 09/23/2023) for CPE.     Alvira Monday, FNP

## 2022-09-22 NOTE — Patient Instructions (Signed)
I appreciate the opportunity to provide care to you today!    Follow up:  1 month for pap smear and 1 year for CPE  Labs: please stop by the lab today to get your blood drawn (CBC, CMP, TSH, Lipid profile, HgA1c, Vit D)  Please continue to a heart-healthy diet and increase your physical activities. Try to exercise for 65mns at least five times a week.   Physical activity helps: Lower your blood glucose, improve your heart health, lower your blood pressure and cholesterol, burn calories to help manage her weight, gave you energy, lower stress, and improve his sleep.  The American diabetes Association (ADA) recommends being active for 2-1/2 hours (150 minutes) or more week.  Exercise for 30 minutes, 5 days a week (150 minutes total)    It was a pleasure to see you and I look forward to continuing to work together on your health and well-being. Please do not hesitate to call the office if you need care or have questions about your care.   Have a wonderful day and week. With Gratitude, GAlvira MondayMSN, FNP-BC

## 2022-09-23 ENCOUNTER — Other Ambulatory Visit: Payer: Self-pay | Admitting: Family Medicine

## 2022-09-23 DIAGNOSIS — E559 Vitamin D deficiency, unspecified: Secondary | ICD-10-CM

## 2022-09-23 LAB — CMP14+EGFR
ALT: 48 IU/L — ABNORMAL HIGH (ref 0–32)
AST: 35 IU/L (ref 0–40)
Albumin/Globulin Ratio: 1.9 (ref 1.2–2.2)
Albumin: 3.9 g/dL (ref 3.9–4.9)
Alkaline Phosphatase: 76 IU/L (ref 44–121)
BUN/Creatinine Ratio: 21 (ref 9–23)
BUN: 19 mg/dL (ref 6–24)
Bilirubin Total: 0.2 mg/dL (ref 0.0–1.2)
CO2: 19 mmol/L — ABNORMAL LOW (ref 20–29)
Calcium: 9.3 mg/dL (ref 8.7–10.2)
Chloride: 106 mmol/L (ref 96–106)
Creatinine, Ser: 0.9 mg/dL (ref 0.57–1.00)
Globulin, Total: 2.1 g/dL (ref 1.5–4.5)
Glucose: 130 mg/dL — ABNORMAL HIGH (ref 70–99)
Potassium: 4.8 mmol/L (ref 3.5–5.2)
Sodium: 139 mmol/L (ref 134–144)
Total Protein: 6 g/dL (ref 6.0–8.5)
eGFR: 81 mL/min/{1.73_m2} (ref >59)

## 2022-09-23 LAB — CBC WITH DIFFERENTIAL/PLATELET
Basophils Absolute: 0 10*3/uL (ref 0.0–0.2)
Basos: 0 %
EOS (ABSOLUTE): 0.6 10*3/uL — ABNORMAL HIGH (ref 0.0–0.4)
Eos: 8 %
Hematocrit: 36.7 % (ref 34.0–46.6)
Hemoglobin: 11.9 g/dL (ref 11.1–15.9)
Immature Grans (Abs): 0 10*3/uL (ref 0.0–0.1)
Immature Granulocytes: 0 %
Lymphocytes Absolute: 2.9 10*3/uL (ref 0.7–3.1)
Lymphs: 38 %
MCH: 30.1 pg (ref 26.6–33.0)
MCHC: 32.4 g/dL (ref 31.5–35.7)
MCV: 93 fL (ref 79–97)
Monocytes Absolute: 0.6 10*3/uL (ref 0.1–0.9)
Monocytes: 7 %
Neutrophils Absolute: 3.6 10*3/uL (ref 1.4–7.0)
Neutrophils: 47 %
Platelets: 255 10*3/uL (ref 150–450)
RBC: 3.95 x10E6/uL (ref 3.77–5.28)
RDW: 12.6 % (ref 11.7–15.4)
WBC: 7.7 10*3/uL (ref 3.4–10.8)

## 2022-09-23 LAB — TSH+FREE T4
Free T4: 1.34 ng/dL (ref 0.82–1.77)
TSH: 1.68 u[IU]/mL (ref 0.450–4.500)

## 2022-09-23 LAB — HEMOGLOBIN A1C
Est. average glucose Bld gHb Est-mCnc: 232 mg/dL
Hgb A1c MFr Bld: 9.7 % — ABNORMAL HIGH (ref 4.8–5.6)

## 2022-09-23 LAB — VITAMIN D 25 HYDROXY (VIT D DEFICIENCY, FRACTURES): Vit D, 25-Hydroxy: 16.4 ng/mL — ABNORMAL LOW (ref 30.0–100.0)

## 2022-09-23 LAB — LIPID PANEL
Chol/HDL Ratio: 2.4 ratio (ref 0.0–4.4)
Cholesterol, Total: 153 mg/dL (ref 100–199)
HDL: 63 mg/dL (ref >39)
LDL Chol Calc (NIH): 78 mg/dL (ref 0–99)
Triglycerides: 56 mg/dL (ref 0–149)
VLDL Cholesterol Cal: 12 mg/dL (ref 5–40)

## 2022-09-23 MED ORDER — VITAMIN D (ERGOCALCIFEROL) 1.25 MG (50000 UNIT) PO CAPS: 50000.0000 [IU] | ORAL_CAPSULE | ORAL | 1 refills | Status: DC

## 2022-09-23 NOTE — Progress Notes (Signed)
Your hemoglobin A1c is 9.7. I recommend following up with Dr. Dorris Fetch as scheduled. A weekly vitamin D supplement prescription has been sent to your pharmacy, and your vitamin D is low. All other labs are stable.

## 2022-09-29 ENCOUNTER — Ambulatory Visit: Payer: Self-pay | Admitting: "Endocrinology

## 2022-09-29 ENCOUNTER — Other Ambulatory Visit (HOSPITAL_COMMUNITY): Payer: Self-pay

## 2022-10-13 ENCOUNTER — Ambulatory Visit: Payer: 59 | Admitting: "Endocrinology

## 2022-10-17 ENCOUNTER — Other Ambulatory Visit (HOSPITAL_COMMUNITY): Payer: Self-pay

## 2022-10-24 ENCOUNTER — Other Ambulatory Visit (HOSPITAL_COMMUNITY): Payer: Self-pay

## 2022-10-24 ENCOUNTER — Other Ambulatory Visit: Payer: Self-pay

## 2022-10-24 ENCOUNTER — Other Ambulatory Visit: Payer: Self-pay | Admitting: Nurse Practitioner

## 2022-10-24 DIAGNOSIS — E559 Vitamin D deficiency, unspecified: Secondary | ICD-10-CM

## 2022-10-24 MED ORDER — VITAMIN D (ERGOCALCIFEROL) 1.25 MG (50000 UNIT) PO CAPS
50000.0000 [IU] | ORAL_CAPSULE | ORAL | 0 refills | Status: AC
  Filled 2022-10-24: qty 8, 56d supply, fill #0

## 2022-11-02 ENCOUNTER — Ambulatory Visit: Payer: 59 | Admitting: "Endocrinology

## 2022-11-03 ENCOUNTER — Ambulatory Visit: Payer: 59 | Admitting: Family Medicine

## 2022-12-12 ENCOUNTER — Other Ambulatory Visit: Payer: Self-pay | Admitting: "Endocrinology

## 2022-12-12 ENCOUNTER — Other Ambulatory Visit (HOSPITAL_COMMUNITY): Payer: Self-pay

## 2022-12-12 DIAGNOSIS — E1165 Type 2 diabetes mellitus with hyperglycemia: Secondary | ICD-10-CM

## 2022-12-12 DIAGNOSIS — E782 Mixed hyperlipidemia: Secondary | ICD-10-CM

## 2022-12-14 ENCOUNTER — Other Ambulatory Visit (HOSPITAL_COMMUNITY): Payer: Self-pay

## 2022-12-26 IMAGING — DX DG CHEST 1V PORT
1 series · 1 of 1 positions shown · non-contrast
Comparison: None.

CLINICAL DATA: Shortness of breath

EXAM:
PORTABLE CHEST 1 VIEW

[chest ap]
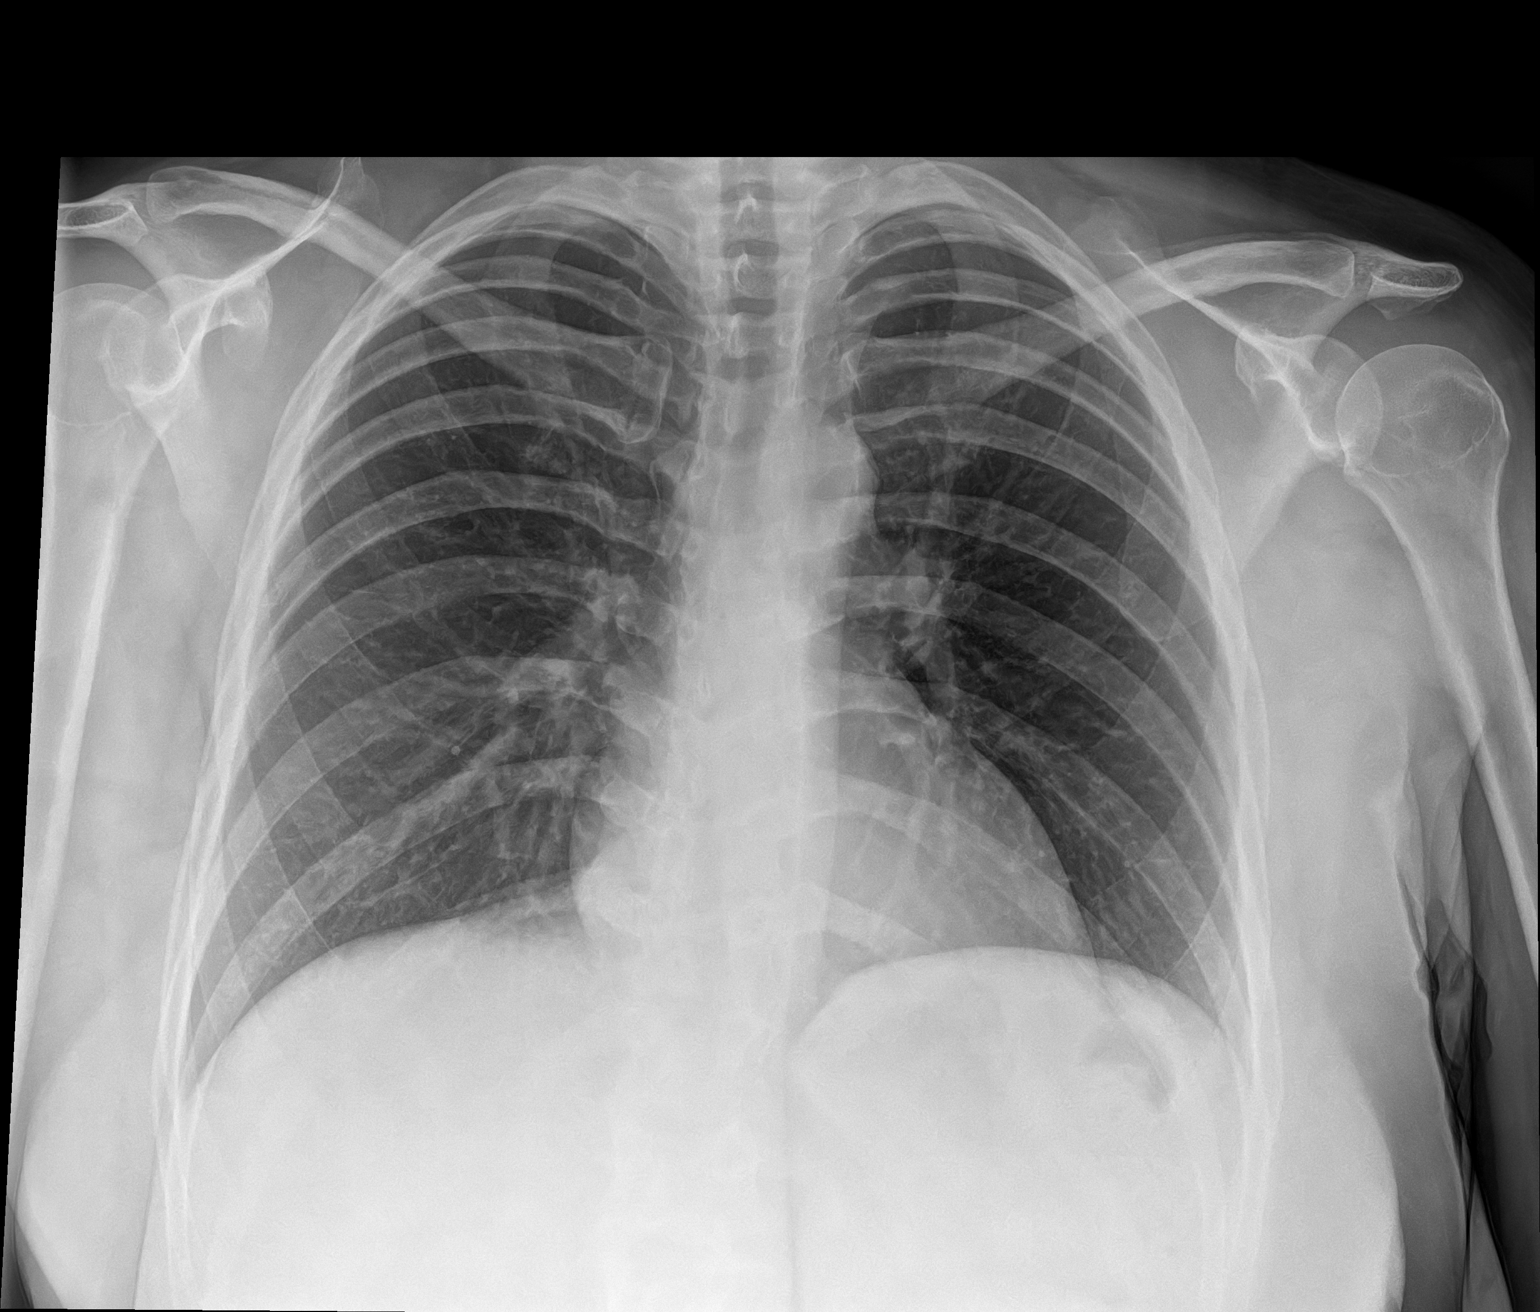

[1 of 1 positions shown; findings below may reference images not displayed]

FINDINGS: The heart size and mediastinal contours are within normal limits.
Both lungs are clear. The visualized skeletal structures are
unremarkable.
IMPRESSION: No active disease.

## 2023-01-11 ENCOUNTER — Ambulatory Visit: Payer: 59 | Admitting: Internal Medicine

## 2023-01-11 ENCOUNTER — Encounter: Payer: Self-pay | Admitting: Internal Medicine

## 2023-09-10 ENCOUNTER — Emergency Department (HOSPITAL_COMMUNITY)
Admission: EM | Admit: 2023-09-10 | Discharge: 2023-09-10 | Disposition: A | Discharge status: Home / Self Care | Payer: Self-pay | Attending: Emergency Medicine | Admitting: Emergency Medicine

## 2023-09-10 ENCOUNTER — Other Ambulatory Visit: Payer: Self-pay

## 2023-09-10 DIAGNOSIS — I10 Essential (primary) hypertension: Secondary | ICD-10-CM | POA: Insufficient documentation

## 2023-09-10 DIAGNOSIS — J101 Influenza due to other identified influenza virus with other respiratory manifestations: Secondary | ICD-10-CM | POA: Insufficient documentation

## 2023-09-10 DIAGNOSIS — E860 Dehydration: Secondary | ICD-10-CM | POA: Insufficient documentation

## 2023-09-10 DIAGNOSIS — R112 Nausea with vomiting, unspecified: Secondary | ICD-10-CM

## 2023-09-10 DIAGNOSIS — Z20822 Contact with and (suspected) exposure to covid-19: Secondary | ICD-10-CM | POA: Insufficient documentation

## 2023-09-10 DIAGNOSIS — E119 Type 2 diabetes mellitus without complications: Secondary | ICD-10-CM | POA: Insufficient documentation

## 2023-09-10 DIAGNOSIS — R197 Diarrhea, unspecified: Secondary | ICD-10-CM | POA: Insufficient documentation

## 2023-09-10 DIAGNOSIS — Z87891 Personal history of nicotine dependence: Secondary | ICD-10-CM | POA: Insufficient documentation

## 2023-09-10 LAB — CBC
HCT: 44.4 % (ref 36.0–46.0)
Hemoglobin: 14.5 g/dL (ref 12.0–15.0)
MCH: 31.5 pg (ref 26.0–34.0)
MCHC: 32.7 g/dL (ref 30.0–36.0)
MCV: 96.5 fL (ref 80.0–100.0)
Platelets: 194 10*3/uL (ref 150–400)
RBC: 4.6 MIL/uL (ref 3.87–5.11)
RDW: 12.4 % (ref 11.5–15.5)
WBC: 10.8 10*3/uL — ABNORMAL HIGH (ref 4.0–10.5)
nRBC: 0 % (ref 0.0–0.2)

## 2023-09-10 LAB — RESP PANEL BY RT-PCR (RSV, FLU A&B, COVID)  RVPGX2
Influenza A by PCR: POSITIVE — AB
Influenza B by PCR: NEGATIVE
Resp Syncytial Virus by PCR: NEGATIVE
SARS Coronavirus 2 by RT PCR: NEGATIVE

## 2023-09-10 LAB — COMPREHENSIVE METABOLIC PANEL
ALT: 37 U/L (ref 0–44)
AST: 33 U/L (ref 15–41)
Albumin: 3.6 g/dL (ref 3.5–5.0)
Alkaline Phosphatase: 55 U/L (ref 38–126)
Anion gap: 20 — ABNORMAL HIGH (ref 5–15)
BUN: 20 mg/dL (ref 6–20)
CO2: 18 mmol/L — ABNORMAL LOW (ref 22–32)
Calcium: 9.4 mg/dL (ref 8.9–10.3)
Chloride: 99 mmol/L (ref 98–111)
Creatinine, Ser: 1.2 mg/dL — ABNORMAL HIGH (ref 0.44–1.00)
GFR, Estimated: 57 mL/min — ABNORMAL LOW (ref >60)
Glucose, Bld: 293 mg/dL — ABNORMAL HIGH (ref 70–99)
Potassium: 4.4 mmol/L (ref 3.5–5.1)
Sodium: 137 mmol/L (ref 135–145)
Total Bilirubin: 1.1 mg/dL (ref 0.0–1.2)
Total Protein: 7.4 g/dL (ref 6.5–8.1)

## 2023-09-10 LAB — TROPONIN I (HIGH SENSITIVITY): Troponin I (High Sensitivity): 7 ng/L (ref <18)

## 2023-09-10 LAB — HCG, SERUM, QUALITATIVE: Preg, Serum: NEGATIVE

## 2023-09-10 LAB — LIPASE, BLOOD: Lipase: 23 U/L (ref 11–51)

## 2023-09-10 MED ORDER — KETOROLAC TROMETHAMINE 15 MG/ML IJ SOLN
15.0000 mg | Freq: Once | INTRAMUSCULAR | Status: AC
  Administered 2023-09-10: 15 mg via INTRAVENOUS
  Filled 2023-09-10: qty 1

## 2023-09-10 MED ORDER — ONDANSETRON HCL 4 MG/2ML IJ SOLN
4.0000 mg | Freq: Once | INTRAMUSCULAR | Status: AC
Start: 2023-09-10 — End: 2023-09-10
  Administered 2023-09-10: 4 mg via INTRAVENOUS
  Filled 2023-09-10: qty 2

## 2023-09-10 MED ORDER — LOPERAMIDE HCL 2 MG PO CAPS: 2.0000 mg | ORAL_CAPSULE | Freq: Four times a day (QID) | ORAL | 0 refills | Status: DC | PRN

## 2023-09-10 MED ORDER — NAPROXEN 500 MG PO TABS: 500.0000 mg | ORAL_TABLET | Freq: Two times a day (BID) | ORAL | 0 refills | Status: DC

## 2023-09-10 MED ORDER — ONDANSETRON 4 MG PO TBDP: 4.0000 mg | ORAL_TABLET | Freq: Three times a day (TID) | ORAL | 0 refills | Status: DC | PRN

## 2023-09-10 MED ORDER — OSELTAMIVIR PHOSPHATE 75 MG PO CAPS: 75.0000 mg | ORAL_CAPSULE | Freq: Two times a day (BID) | ORAL | 0 refills | Status: DC

## 2023-09-10 MED ORDER — FAMOTIDINE IN NACL 20-0.9 MG/50ML-% IV SOLN
20.0000 mg | Freq: Once | INTRAVENOUS | Status: AC
Start: 2023-09-10 — End: 2023-09-10
  Administered 2023-09-10: 20 mg via INTRAVENOUS
  Filled 2023-09-10: qty 50

## 2023-09-10 MED ORDER — SODIUM CHLORIDE 0.9 % IV BOLUS
1000.0000 mL | Freq: Once | INTRAVENOUS | Status: AC
  Administered 2023-09-10: 1000 mL via INTRAVENOUS

## 2023-09-10 NOTE — ED Provider Notes (Signed)
 [facility]-EMERGENCY DEPT [facility] Emergency Department Provider Note MRN:  [id number]  Arrival date & time: 09/10/23     Chief Complaint   Emesis   History of Present Illness   Anne Wright is a 48 y.o. year-old female with a history of hypertension, diabetes presenting to the ED with chief complaint of emesis.  2 days of malaise, fatigue, chills, possible fever, body aches, for the past several hours having nausea vomiting, diarrhea, unable to keep anything down.  Review of Systems  A thorough review of systems was obtained and all systems are negative except as noted in the HPI and PMH.   Patient's Health History    Past Medical History:  Diagnosis Date   Diabetes mellitus without complication (HCC)    High cholesterol    Hypertension     No past surgical history on file.  Family History  Problem Relation Age of Onset   Diabetes Mother    Hypertension Mother    Asthma Brother    Breast cancer Maternal Grandmother    Asthma Son    Colon cancer Neg Hx    Cancer - Lung Neg Hx    Cervical cancer Neg Hx     Social History   Socioeconomic History   Marital status: Single    Spouse name: Not on file   Number of children: 2   Years of education: Not on file   Highest education level: Not on file  Occupational History   Not on file  Tobacco Use   Smoking status: Former    Current packs/day: 0.00    Types: Cigarettes    Quit date: 06/29/2018    Years since quitting: 5.2    Passive exposure: Past   Smokeless tobacco: Never   Tobacco comments:    She smoked 10 cigarettes a day for 15 years , quit 2019.   Vaping Use   Vaping status: Never Used  Substance and Sexual Activity   Alcohol use: Yes    Comment: occa   Drug use: No   Sexual activity: Not Currently  Other Topics Concern   Not on file  Social History Narrative   Lives with her son.    Social Drivers of Corporate Investment Banker Strain: Not on file  Food Insecurity: Not on file   Transportation Needs: Not on file  Physical Activity: Not on file  Stress: Not on file  Social Connections: Not on file  Intimate Partner Violence: Not on file     Physical Exam   Vitals:   09/10/23 0413 09/10/23 0430  BP:  (!) 177/95  Pulse: 99 (!) 109  Resp:  18  Temp:    SpO2: 100% 100%    CONSTITUTIONAL: Well-appearing, NAD NEURO/PSYCH:  Alert and oriented x 3, no focal deficits EYES:  eyes equal and reactive ENT/NECK:  no LAD, no JVD CARDIO: Tachycardic rate, well-perfused, normal S1 and S2 PULM:  CTAB no wheezing or rhonchi GI/GU:  non-distended, non-tender MSK/SPINE:  No gross deformities, no edema SKIN:  no rash, atraumatic   *Additional and/or pertinent findings included in MDM below  Diagnostic and Interventional Summary    EKG Interpretation Date/Time:  Sunday September 10 2023 04:48:28 EST Ventricular Rate:  101 PR Interval:  114 QRS Duration:  76 QT Interval:  381 QTC Calculation: 494 R Axis:   82  Text Interpretation: Sinus tachycardia Borderline prolonged QT interval Confirmed by Theadore Sharper [phone]) on 09/10/2023 5:11:13 AM       Labs Reviewed  RESP PANEL BY RT-PCR (RSV, FLU A&B, COVID)  RVPGX2 - Abnormal; Notable for the following components:      Result Value   Influenza A by PCR POSITIVE (*)    All other components within normal limits  CBC - Abnormal; Notable for the following components:   WBC 10.8 (*)    All other components within normal limits  COMPREHENSIVE METABOLIC PANEL - Abnormal; Notable for the following components:   CO2 18 (*)    Glucose, Bld 293 (*)    Creatinine, Ser 1.20 (*)    GFR, Estimated 57 (*)    Anion gap 20 (*)    All other components within normal limits  LIPASE, BLOOD  HCG, SERUM, QUALITATIVE  TROPONIN I (HIGH SENSITIVITY)  TROPONIN I (HIGH SENSITIVITY)    No orders to display    Medications  sodium chloride  0.9 % bolus 1,000 mL (1,000 mLs Intravenous New Bag/Given 09/10/23 0350)  ondansetron  (ZOFRAN )  injection 4 mg (4 mg Intravenous Given 09/10/23 0345)  ketorolac  (TORADOL ) 15 MG/ML injection 15 mg (15 mg Intravenous Given 09/10/23 0346)  famotidine  (PEPCID ) IVPB 20 mg premix (0 mg Intravenous Stopped 09/10/23 0418)     Procedures  /  Critical Care Procedures  ED Course and Medical Decision Making  Initial Impression and Ddx Symptoms are suggestive of viral illness, possibly the flu or norovirus.  Mildly tachycardic.  Abdomen is soft and nontender.  Given the poor p.o. intake will check labs for electrolyte derangement, AKI.  Providing fluids and symptomatic management and will reassess.  Past medical/surgical history that increases complexity of ED encounter: Hypertension, diabetes  Interpretation of Diagnostics I personally reviewed the EKG and my interpretation is as follows: Sinus rhythm  Labs reveal mild AKI, mild acidosis in the setting of dehydration.  Patient is flu positive.  Patient Reassessment and Ultimate Disposition/Management     Patient feeling better, vital signs have improved, continued soft nontender abdomen, appropriate for discharge.  Patient management required discussion with the following services or consulting groups:  None  Complexity of Problems Addressed Acute illness or injury that poses threat of life of bodily function  Additional Data Reviewed and Analyzed Further history obtained from: Further history from spouse/family member  Additional Factors Impacting ED Encounter Risk Prescriptions and Consideration of hospitalization  Ozell HERO. Theadore, MD [facility] Emergency Medicine [facility] [email or web address]  Final Clinical Impressions(s) / ED Diagnoses     ICD-10-CM   1. Influenza A  J10.1     2. Nausea vomiting and diarrhea  R11.2    R19.7     3. Dehydration  E86.0       ED Discharge Orders          Ordered    ondansetron  (ZOFRAN -ODT) 4 MG disintegrating tablet  Every 8 hours PRN        09/10/23 0511     oseltamivir  (TAMIFLU ) 75 MG capsule  Every 12 hours        09/10/23 0511    loperamide  (IMODIUM ) 2 MG capsule  4 times daily PRN        09/10/23 0511    naproxen  (NAPROSYN ) 500 MG tablet  2 times daily        09/10/23 9488             Discharge Instructions Discussed with and Provided to Patient:     Discharge Instructions      You were evaluated in the Emergency Department and after careful evaluation, we did  not find any emergent condition requiring admission or further testing in the hospital.  Your exam/testing today is overall reassuring.  Symptoms seem to be due to the flu.  You tested positive here in the emergency department.  Use the Zofran  as needed for nausea, use the Imodium  as needed for diarrhea, use the Tamiflu  to help you get better faster, use the Naprosyn  as needed for aches and pains.  Plenty of fluids and rest.  Please return to the Emergency Department if you experience any worsening of your condition.   Thank you for allowing us  to be a part of your care.       Theadore Ozell HERO, MD 09/10/23 [phone]

## 2023-09-10 NOTE — Discharge Instructions (Signed)
 You were evaluated in the Emergency Department and after careful evaluation, we did not find any emergent condition requiring admission or further testing in the hospital.  Your exam/testing today is overall reassuring.  Symptoms seem to be due to the flu.  You tested positive here in the emergency department.  Use the Zofran  as needed for nausea, use the Imodium  as needed for diarrhea, use the Tamiflu  to help you get better faster, use the Naprosyn  as needed for aches and pains.  Plenty of fluids and rest.  Please return to the Emergency Department if you experience any worsening of your condition.   Thank you for allowing us  to be a part of your care.

## 2023-09-10 NOTE — ED Triage Notes (Addendum)
 Pt c/o cough, N/V/D, body aches, headache and burning in chest x2 days.   Reports her child tested positive for flu on Friday

## 2023-09-11 ENCOUNTER — Emergency Department (HOSPITAL_COMMUNITY)
Admission: EM | Admit: 2023-09-11 | Discharge: 2023-09-11 | Disposition: A | Discharge status: Home / Self Care | Payer: Self-pay | Attending: Emergency Medicine | Admitting: Emergency Medicine

## 2023-09-11 ENCOUNTER — Encounter (HOSPITAL_COMMUNITY): Payer: Self-pay

## 2023-09-11 ENCOUNTER — Emergency Department (HOSPITAL_COMMUNITY): Payer: Self-pay

## 2023-09-11 ENCOUNTER — Other Ambulatory Visit: Payer: Self-pay

## 2023-09-11 DIAGNOSIS — E119 Type 2 diabetes mellitus without complications: Secondary | ICD-10-CM | POA: Insufficient documentation

## 2023-09-11 DIAGNOSIS — Z794 Long term (current) use of insulin: Secondary | ICD-10-CM | POA: Insufficient documentation

## 2023-09-11 DIAGNOSIS — Z79899 Other long term (current) drug therapy: Secondary | ICD-10-CM | POA: Insufficient documentation

## 2023-09-11 DIAGNOSIS — J111 Influenza due to unidentified influenza virus with other respiratory manifestations: Secondary | ICD-10-CM | POA: Insufficient documentation

## 2023-09-11 DIAGNOSIS — Z7982 Long term (current) use of aspirin: Secondary | ICD-10-CM | POA: Insufficient documentation

## 2023-09-11 DIAGNOSIS — I10 Essential (primary) hypertension: Secondary | ICD-10-CM | POA: Insufficient documentation

## 2023-09-11 LAB — CBC WITH DIFFERENTIAL/PLATELET
Abs Immature Granulocytes: 0.04 10*3/uL (ref 0.00–0.07)
Basophils Absolute: 0 10*3/uL (ref 0.0–0.1)
Basophils Relative: 0 %
Eosinophils Absolute: 0 10*3/uL (ref 0.0–0.5)
Eosinophils Relative: 0 %
HCT: 41.7 % (ref 36.0–46.0)
Hemoglobin: 13.8 g/dL (ref 12.0–15.0)
Immature Granulocytes: 0 %
Lymphocytes Relative: 14 %
Lymphs Abs: 1.4 10*3/uL (ref 0.7–4.0)
MCH: 31.4 pg (ref 26.0–34.0)
MCHC: 33.1 g/dL (ref 30.0–36.0)
MCV: 95 fL (ref 80.0–100.0)
Monocytes Absolute: 1.2 10*3/uL — ABNORMAL HIGH (ref 0.1–1.0)
Monocytes Relative: 12 %
Neutro Abs: 6.9 10*3/uL (ref 1.7–7.7)
Neutrophils Relative %: 74 %
Platelets: 171 10*3/uL (ref 150–400)
RBC: 4.39 MIL/uL (ref 3.87–5.11)
RDW: 12.3 % (ref 11.5–15.5)
WBC: 9.5 10*3/uL (ref 4.0–10.5)
nRBC: 0 % (ref 0.0–0.2)

## 2023-09-11 LAB — COMPREHENSIVE METABOLIC PANEL
ALT: 26 U/L (ref 0–44)
AST: 28 U/L (ref 15–41)
Albumin: 3 g/dL — ABNORMAL LOW (ref 3.5–5.0)
Alkaline Phosphatase: 45 U/L (ref 38–126)
Anion gap: 15 (ref 5–15)
BUN: 29 mg/dL — ABNORMAL HIGH (ref 6–20)
CO2: 20 mmol/L — ABNORMAL LOW (ref 22–32)
Calcium: 8.5 mg/dL — ABNORMAL LOW (ref 8.9–10.3)
Chloride: 96 mmol/L — ABNORMAL LOW (ref 98–111)
Creatinine, Ser: 1.22 mg/dL — ABNORMAL HIGH (ref 0.44–1.00)
GFR, Estimated: 56 mL/min — ABNORMAL LOW (ref >60)
Glucose, Bld: 281 mg/dL — ABNORMAL HIGH (ref 70–99)
Potassium: 4.4 mmol/L (ref 3.5–5.1)
Sodium: 131 mmol/L — ABNORMAL LOW (ref 135–145)
Total Bilirubin: 0.9 mg/dL (ref 0.0–1.2)
Total Protein: 6.5 g/dL (ref 6.5–8.1)

## 2023-09-11 MED ORDER — NAPROXEN 250 MG PO TABS
500.0000 mg | ORAL_TABLET | Freq: Once | ORAL | Status: AC
  Administered 2023-09-11: 500 mg via ORAL
  Filled 2023-09-11: qty 2

## 2023-09-11 MED ORDER — LISINOPRIL 10 MG PO TABS
10.0000 mg | ORAL_TABLET | Freq: Every day | ORAL | 3 refills | Status: DC
Start: 2023-09-11 — End: 2023-09-30

## 2023-09-11 MED ORDER — LACTATED RINGERS IV BOLUS
1000.0000 mL | Freq: Once | INTRAVENOUS | Status: AC
Start: 2023-09-11 — End: 2023-09-11
  Administered 2023-09-11: 1000 mL via INTRAVENOUS

## 2023-09-11 MED ORDER — ONDANSETRON 4 MG PO TBDP: 4.0000 mg | ORAL_TABLET | Freq: Three times a day (TID) | ORAL | 0 refills | Status: DC | PRN

## 2023-09-11 MED ORDER — NAPROXEN 500 MG PO TABS: 500.0000 mg | ORAL_TABLET | Freq: Two times a day (BID) | ORAL | 0 refills | Status: DC

## 2023-09-11 MED ORDER — OSELTAMIVIR PHOSPHATE 75 MG PO CAPS: 75.0000 mg | ORAL_CAPSULE | Freq: Two times a day (BID) | ORAL | 0 refills | Status: AC

## 2023-09-11 MED ORDER — ONDANSETRON 4 MG PO TBDP
4.0000 mg | ORAL_TABLET | Freq: Once | ORAL | Status: AC
  Administered 2023-09-11: 4 mg via ORAL
  Filled 2023-09-11: qty 1

## 2023-09-11 MED ORDER — SODIUM CHLORIDE 0.9 % IV BOLUS: 1000.0000 mL | Freq: Once | INTRAVENOUS | Status: DC

## 2023-09-11 NOTE — ED Triage Notes (Signed)
 Pt arrived via POV from home c/o persistent flu symptoms. Pt reports she was unable to pick up her prescriptions from the pharmacy that were sent earlier to day.

## 2023-09-11 NOTE — ED Triage Notes (Signed)
 Pt still endorsing N/V/D.

## 2023-09-11 NOTE — ED Provider Notes (Signed)
[place] EMERGENCY DEPARTMENT AT [facility] Provider Note   CSN: [id number] Arrival date & time: 09/11/23  1737     History  Chief Complaint  Patient presents with   Influenza    Anne Wright is a 48 y.o. female with past medical history of T2DM, HLD, HTN presents to emergency department for evaluation of bodyaches, vomiting, nonproductive cough, nausea.  She endorses vomiting 6-10 times a day.  She was seen at [facility], ED yesterday and diagnosed with the flu.  She was provided prescription for Tamiflu, naproxen, Imodium, Zofran however she states that she has not picked up the prescriptions due to insurance not covering them.  She also states she is worried that she has pneumonia as she felt similar then as she does now.    Influenza Presenting symptoms: cough, fatigue and vomiting   Presenting symptoms: no diarrhea, no fever, no headaches, no nausea and no shortness of breath   Associated symptoms: chills       Home Medications Prior to Admission medications   Medication Sig Start Date End Date Taking? Authorizing Provider  aspirin EC 81 MG tablet Take 81 mg by mouth daily.    [provider]  atorvastatin (LIPITOR) 20 MG tablet Take 1 tablet (20 mg total) by mouth daily. 06/08/22   Roma Kayser, MD  Blood Glucose Monitoring Suppl (FREESTYLE LITE) w/Device KIT Use to monitor glucose 4 times a day 05/10/22   Roma Kayser, MD  busPIRone (BUSPAR) 5 MG tablet Take 1 tablet (5 mg total) by mouth 3 (three) times daily. 04/21/22   Paseda, Baird Kay, FNP  glipiZIDE (GLUCOTROL XL) 5 MG 24 hr tablet Take 1 tablet (5 mg total) by mouth daily with breakfast. 06/29/22   Roma Kayser, MD  glucose blood (FREESTYLE LITE) test strip Use to monitor glucose 4 times a day as instructed 08/05/22   Gilmore Laroche, FNP  insulin glargine-yfgn (SEMGLEE) 100 UNIT/ML Pen Inject 26 Units into the skin at bedtime. 06/29/22   Roma Kayser, MD   Insulin Pen Needle (BD PEN NEEDLE NANO U/F) 32G X 4 MM MISC Use once daily 08/05/22   Gilmore Laroche, FNP  Lancets (FREESTYLE) lancets Test blood glucose four times daily. Use as instructed 05/10/22   Roma Kayser, MD  lisinopril (ZESTRIL) 10 MG tablet Take 1 tablet (10 mg total) by mouth daily. 09/11/23   Judithann Sheen, PA  loperamide (IMODIUM) 2 MG capsule Take 1 capsule (2 mg total) by mouth 4 (four) times daily as needed for diarrhea or loose stools. 09/10/23   Sabas Sous, MD  naproxen (NAPROSYN) 500 MG tablet Take 1 tablet (500 mg total) by mouth 2 (two) times daily. 09/11/23   Judithann Sheen, PA  omeprazole (PRILOSEC) 40 MG capsule Take 1 capsule (40 mg total) by mouth in the morning and at bedtime. 09/07/22 09/07/23  Lanelle Bal, DO  ondansetron (ZOFRAN-ODT) 4 MG disintegrating tablet Take 1 tablet (4 mg total) by mouth every 8 (eight) hours as needed for nausea or vomiting. 09/11/23   Judithann Sheen, PA  oseltamivir (TAMIFLU) 75 MG capsule Take 1 capsule (75 mg total) by mouth every 12 (twelve) hours for 5 days. 09/11/23 09/16/23  Judithann Sheen, PA  Vitamin D, Ergocalciferol, (DRISDOL) 1.25 MG (50000 UNIT) CAPS capsule Take 1 capsule (50,000 Units total) by mouth every 7 (seven) days. 09/23/22   Gilmore Laroche, FNP  insulin glargine (LANTUS SOLOSTAR) 100 UNIT/ML Solostar Pen Inject  24 Units into the skin at bedtime. 06/30/21 05/11/22  Roma Kayser, MD      Allergies    Amoxicillin and Shellfish allergy    Review of Systems   Review of Systems  Constitutional:  Positive for chills and fatigue. Negative for fever.  Respiratory:  Positive for cough. Negative for chest tightness, shortness of breath and wheezing.   Cardiovascular:  Negative for chest pain and palpitations.  Gastrointestinal:  Positive for vomiting. Negative for abdominal pain, constipation, diarrhea and nausea.  Neurological:  Negative for dizziness, seizures, weakness, light-headedness, numbness and  headaches.    Physical Exam Updated Vital Signs BP 112/71   Pulse 91   Temp 98.9 F (37.2 C) (Oral)   Resp 17   Ht 5\' 2"  (1.575 m)   Wt 68 kg   LMP 08/21/2023 (Exact Date)   SpO2 99%   BMI 27.44 kg/m  Physical Exam Vitals and nursing note reviewed.  Constitutional:      General: She is not in acute distress.    Appearance: Normal appearance. She is ill-appearing.  HENT:     Head: Normocephalic and atraumatic.     Right Ear: Tympanic membrane, ear canal and external ear normal.     Left Ear: Tympanic membrane, ear canal and external ear normal.     Mouth/Throat:     Mouth: Mucous membranes are moist.     Pharynx: No oropharyngeal exudate or posterior oropharyngeal erythema.  Eyes:     General: No scleral icterus.       Right eye: No discharge.        Left eye: No discharge.     Conjunctiva/sclera: Conjunctivae normal.     Pupils: Pupils are equal, round, and reactive to light.  Neck:     Comments: No meningimus Cardiovascular:     Rate and Rhythm: Normal rate.     Pulses: Normal pulses.     Heart sounds: Normal heart sounds.  Pulmonary:     Effort: Pulmonary effort is normal. No respiratory distress.     Breath sounds: Normal breath sounds.  Chest:     Chest wall: No tenderness.  Abdominal:     General: Bowel sounds are normal. There is no distension.     Palpations: Abdomen is soft.     Tenderness: There is no abdominal tenderness. There is no guarding or rebound.     Comments: Negative heeltap  Musculoskeletal:     Cervical back: Normal range of motion and neck supple. No rigidity or tenderness.     Right lower leg: No edema.     Left lower leg: No edema.  Skin:    General: Skin is warm.     Capillary Refill: Capillary refill takes less than 2 seconds.     Coloration: Skin is not jaundiced or pale.  Neurological:     Mental Status: She is alert and oriented to person, place, and time. Mental status is at baseline.     ED Results / Procedures /  Treatments   Labs (all labs ordered are listed, but only abnormal results are displayed) Labs Reviewed  CBC WITH DIFFERENTIAL/PLATELET - Abnormal; Notable for the following components:      Result Value   Monocytes Absolute 1.2 (*)    All other components within normal limits  COMPREHENSIVE METABOLIC PANEL - Abnormal; Notable for the following components:   Sodium 131 (*)    Chloride 96 (*)    CO2 20 (*)    Glucose, Bld  281 (*)    BUN 29 (*)    Creatinine, Ser 1.22 (*)    Calcium 8.5 (*)    Albumin 3.0 (*)    GFR, Estimated 56 (*)    All other components within normal limits    EKG None  Radiology DG Chest 2 View Result Date: 09/11/2023 CLINICAL DATA:  Flu like symptoms. EXAM: CHEST - 2 VIEW COMPARISON:  September 16, 2020 FINDINGS: The heart size and mediastinal contours are within normal limits. There is no evidence of an acute infiltrate, pleural effusion or pneumothorax. The visualized skeletal structures are unremarkable. IMPRESSION: No active cardiopulmonary disease. Electronically Signed   By: Aram Candela M.D.   On: 09/11/2023 23:26    Procedures Procedures    Medications Ordered in ED Medications  sodium chloride 0.9 % bolus 1,000 mL (1,000 mLs Intravenous Not Given 09/11/23 2232)  ondansetron (ZOFRAN-ODT) disintegrating tablet 4 mg (4 mg Oral Given 09/11/23 1758)  lactated ringers bolus 1,000 mL (0 mLs Intravenous Stopped 09/11/23 2300)  naproxen (NAPROSYN) tablet 500 mg (500 mg Oral Given 09/11/23 2225)    ED Course/ Medical Decision Making/ A&P                                 Medical Decision Making Amount and/or Complexity of Data Reviewed Labs: ordered. Radiology: ordered.  Risk Prescription drug management.   Patient presents to the ED for concern of influenza symptoms, this involves an extensive number of treatment options, and is a complaint that carries with it a high risk of complications and morbidity.    Co morbidities that complicate the  patient evaluation  Recent influenza diagnosis   Additional history obtained:  Additional history obtained from  Nursing and Outside Medical Records   External records from outside source obtained and reviewed including ED note from 09/10/2023, triage RN note   Lab Tests:  I Ordered, and personally interpreted labs.  The pertinent results include:   I would like elevated creatinine 1.3 Sodium 131 Chloride 96 CO2 20 CBG 281 BUN 29 Anion gap 15   Imaging Studies ordered:  I ordered imaging studies including chest x-ray I independently visualized and interpreted imaging which showed no pneumonia I agree with the radiologist interpretation   Medicines ordered and prescription drug management:  I ordered medication including Zofran and LR for nausea and vomiting Reevaluation of the patient after these medicines showed that the patient improved I have reviewed the patients home medicines and have made adjustments as needed    Problem List / ED Course:  Influenza Labs significant for mildly elevated creatinine likely due to dehydration Labs from ED visit on 2/9 significant for mildly elevated creatinine, negative hCG, negative troponin and mildly elevated white blood count.  Found to positive Chest ray negative for pneumonia Will provide patient with LR bolus and Zofran for symptoms I switch patient's prescriptions from [facility] to [facility] as these tend to be cheaper to see if patient can afford them. Discussed ED workup, disposition, return to emergency department precautions with patient who expresses understand agrees with plan.  All questions answered to her satisfaction.  She is agreeable discharge.   Reevaluation:  After the interventions noted above, I reevaluated the patient and found that they have :improved     Dispostion:  After consideration of the diagnostic results and the patients response to treatment, I feel that the patent would benefit from  outpatient management with symptomatic  care.   Final Clinical Impression(s) / ED Diagnoses Final diagnoses:  Influenza    Rx / DC Orders ED Discharge Orders          Ordered    ondansetron (ZOFRAN-ODT) 4 MG disintegrating tablet  Every 8 hours PRN        09/11/23 2333    oseltamivir (TAMIFLU) 75 MG capsule  Every 12 hours        09/11/23 2333    naproxen (NAPROSYN) 500 MG tablet  2 times daily        09/11/23 2333    lisinopril (ZESTRIL) 10 MG tablet  Daily        09/11/23 2333              Judithann Sheen, PA 09/12/23 Ivor Reining    Gloris Manchester, MD 09/12/23 [phone]

## 2023-09-11 NOTE — Discharge Instructions (Addendum)
 Thank you for letting us  evaluate you today.  We gave you a liter of fluids, antinausea medicine here in emergency department improved your symptoms.  I have resent your prescriptions to [facility] hopefully they are cheaper there.  If you have to pick pick the Zofran  as that is the antinausea and will help you immensely.  Naproxen  is a "strong ibuprofen " so you can take ibuprofen  and Tylenol  at home if you can afford that.  Make sure to drink water, chicken broth, Gatorade, Pedialyte, low sugar juices  Return to emergency department if you experience intractable vomiting, significant worsening of symptoms

## 2023-09-25 ENCOUNTER — Encounter: Payer: 59 | Admitting: Family Medicine

## 2023-09-25 ENCOUNTER — Telehealth: Payer: Self-pay

## 2023-09-25 NOTE — Telephone Encounter (Signed)
What medications does she need refilled?

## 2023-09-25 NOTE — Telephone Encounter (Signed)
 Copied from CRM [id number]. Topic: Appointments - Scheduling Inquiry for Clinic >> Sep 25, 2023  8:59 AM Ivette P wrote: Reason for CRM: PT called in because she missed her appt on 02/24 and would like to reschedule. Pt needs medication refills and primary is scheduled out until 04/17. Pt needs something sooner and requesting follow up.

## 2023-09-27 ENCOUNTER — Telehealth: Payer: Self-pay

## 2023-09-27 NOTE — Telephone Encounter (Signed)
 Called to request more info . No answer. Lvm.

## 2023-09-27 NOTE — Telephone Encounter (Signed)
 Copied from CRM [id number]. Topic: Appointments - Scheduling Inquiry for Clinic >> Sep 27, 2023  2:11 PM Ivette P wrote: Pt called back in due to a missed phone call. Read the notes and asked pt which medicaiton they needed refill on. Pt stated she needs the following refilled and is available for a callback Number [phone]   Medicaitons:  lisinopril (ZESTRIL) 10 MG tablet [[id number]] Insulin Pen Needle (BD PEN NEEDLE NANO U/F) 32G X 4 MM MISC glipiZIDE (GLUCOTROL XL) 5 MG 24 hr tablet [[id number]]  atorvastatin (LIPITOR) 20 MG tablet [[id number]]

## 2023-09-29 ENCOUNTER — Encounter: Payer: Self-pay | Admitting: Family Medicine

## 2023-09-30 ENCOUNTER — Other Ambulatory Visit: Payer: Self-pay | Admitting: Family Medicine

## 2023-09-30 DIAGNOSIS — E782 Mixed hyperlipidemia: Secondary | ICD-10-CM

## 2023-09-30 DIAGNOSIS — E1165 Type 2 diabetes mellitus with hyperglycemia: Secondary | ICD-10-CM

## 2023-09-30 DIAGNOSIS — I10 Essential (primary) hypertension: Secondary | ICD-10-CM

## 2023-09-30 MED ORDER — LISINOPRIL 10 MG PO TABS
10.0000 mg | ORAL_TABLET | Freq: Every day | ORAL | 3 refills | Status: DC
Start: 2023-09-30 — End: 2023-11-15

## 2023-09-30 MED ORDER — ATORVASTATIN CALCIUM 20 MG PO TABS
20.0000 mg | ORAL_TABLET | Freq: Every day | ORAL | 1 refills | Status: DC
Start: 2023-09-30 — End: 2023-11-15

## 2023-09-30 MED ORDER — BD PEN NEEDLE NANO U/F 32G X 4 MM MISC
Freq: Every day | 3 refills | Status: DC
Start: 2023-09-30 — End: 2024-03-11

## 2023-09-30 MED ORDER — GLIPIZIDE ER 5 MG PO TB24: 5.0000 mg | ORAL_TABLET | Freq: Every day | ORAL | 2 refills | Status: DC

## 2023-10-09 NOTE — Telephone Encounter (Signed)
 Second attempt for more info. No answer. Lvm. trc

## 2023-11-14 NOTE — Telephone Encounter (Signed)
 Patient came by office out of medicine need med refills up until 06.30.2025, next available appointment.    Medicaitons:   lisinopril (ZESTRIL) 10 MG tablet [[id number]]  Insulin Pen Needle (BD PEN NEEDLE NANO U/F) 32G X 4 MM MISC  glipiZIDE (GLUCOTROL XL) 5 MG 24 hr tablet [[id number]]   atorvastatin (LIPITOR) 20 MG tablet [[id number]]     Pharmacy: [facility]

## 2023-11-15 ENCOUNTER — Other Ambulatory Visit: Payer: Self-pay

## 2023-11-15 ENCOUNTER — Ambulatory Visit (INDEPENDENT_AMBULATORY_CARE_PROVIDER_SITE_OTHER)

## 2023-11-15 VITALS — BP 174/104 | HR 92 | Ht 62.0 in | Wt 125.1 lb

## 2023-11-15 DIAGNOSIS — R8290 Unspecified abnormal findings in urine: Secondary | ICD-10-CM | POA: Diagnosis not present

## 2023-11-15 DIAGNOSIS — I10 Essential (primary) hypertension: Secondary | ICD-10-CM

## 2023-11-15 DIAGNOSIS — E1165 Type 2 diabetes mellitus with hyperglycemia: Secondary | ICD-10-CM

## 2023-11-15 DIAGNOSIS — E782 Mixed hyperlipidemia: Secondary | ICD-10-CM | POA: Diagnosis not present

## 2023-11-15 MED ORDER — GLIPIZIDE ER 5 MG PO TB24: 5.0000 mg | ORAL_TABLET | Freq: Every day | ORAL | 1 refills | Status: DC

## 2023-11-15 MED ORDER — LISINOPRIL 10 MG PO TABS: 10.0000 mg | ORAL_TABLET | Freq: Every day | ORAL | 1 refills | Status: DC

## 2023-11-15 MED ORDER — FREESTYLE LITE TEST VI STRP: ORAL_STRIP | Freq: Four times a day (QID) | 2 refills | Status: DC

## 2023-11-15 MED ORDER — FREESTYLE LANCETS MISC: 12 refills | Status: DC

## 2023-11-15 MED ORDER — INSULIN GLARGINE-YFGN 100 UNIT/ML ~~LOC~~ SOPN: 20.0000 [IU] | PEN_INJECTOR | Freq: Every day | SUBCUTANEOUS | 1 refills | Status: DC

## 2023-11-15 MED ORDER — FREESTYLE LITE W/DEVICE KIT: PACK | 0 refills | Status: DC

## 2023-11-15 MED ORDER — ATORVASTATIN CALCIUM 20 MG PO TABS: 20.0000 mg | ORAL_TABLET | Freq: Every day | ORAL | 1 refills | Status: DC

## 2023-11-15 NOTE — Progress Notes (Signed)
 Established Patient Office Visit  Subjective   Patient ID: Anne Wright, female    DOB: 1976-01-23  Age: 48 y.o. MRN: [id number]  Chief Complaint  Patient presents with   Medical Management of Chronic Issues    Pt states she "Belives to have a UTI and yeast infection and that she usually gets them because of her diabetes"    Diabetes She presents for her follow-up diabetic visit. She has type 2 diabetes mellitus. No MedicAlert identification noted. The initial diagnosis of diabetes was made 5 years ago. Her disease course has been worsening. There are no hypoglycemic associated symptoms. Associated symptoms include polyuria. There are no hypoglycemic complications. Symptoms are worsening (has been out of medications for 6 months or longer). There are no diabetic complications. Risk factors for coronary artery disease include diabetes mellitus, dyslipidemia and hypertension. When asked about current treatments, none (has been out of meds for 6 months) were reported. She is following a high fat/cholesterol and generally unhealthy diet. When asked about meal planning, she reported none. She has not had a previous visit with a dietitian. She rarely participates in exercise. Home blood sugar record trend: not checking BS. An ACE inhibitor/angiotensin II receptor blocker is not being taken.    Patient Active Problem List   Diagnosis Date Noted   Trigger finger, right middle finger 09/22/2022   Callus under metatarsal head 08/06/2022   Need for immunization against influenza 04/21/2022   Non-adherence to medical treatment 11/01/2021   Encounter for annual general medical examination with abnormal findings in adult 09/16/2021   Elevated liver enzymes 09/16/2021   Bacterial vaginosis 09/15/2021   Smoker 09/15/2021   Ingrown nail of great toe 09/07/2021   Dizziness 09/07/2021   Finger pain, right 09/07/2021   Vitamin D deficiency 06/05/2020   Essential hypertension, benign 01/20/2020   Mixed  hyperlipidemia 01/06/2020   Anxiety 11/14/2019   Depressive disorder 11/14/2019   Uncontrolled type 2 diabetes mellitus with hyperglycemia (HCC) 10/08/2018   Past Medical History:  Diagnosis Date   Diabetes mellitus without complication (HCC)    High cholesterol    Hypertension       ROS    Objective:     BP (!) 174/104   Pulse 92   Ht 5\' 2"  (1.575 m)   Wt 125 lb 1.3 oz (56.7 kg)   SpO2 98%   BMI 22.88 kg/m  BP Readings from Last 3 Encounters:  11/15/23 (!) 174/104  09/11/23 112/71  09/10/23 138/87   Wt Readings from Last 3 Encounters:  11/15/23 125 lb 1.3 oz (56.7 kg)  09/11/23 150 lb (68 kg)  09/10/23 150 lb (68 kg)      Physical Exam Vitals and nursing note reviewed.  Constitutional:      Appearance: Normal appearance.  Eyes:     Extraocular Movements: Extraocular movements intact.     Pupils: Pupils are equal, round, and reactive to light.  Cardiovascular:     Rate and Rhythm: Normal rate and regular rhythm.  Pulmonary:     Effort: Pulmonary effort is normal.     Breath sounds: Normal breath sounds.  Abdominal:     Tenderness: There is no right CVA tenderness or left CVA tenderness.  Musculoskeletal:     Right lower leg: No edema.     Left lower leg: No edema.  Neurological:     Mental Status: She is alert and oriented to person, place, and time.  Psychiatric:        Mood  and Affect: Mood normal.        Thought Content: Thought content normal.      No results found for any visits on 11/15/23.    The 10-year ASCVD risk score (Arnett DK, et al., 2019) is: 13.7%    Assessment & Plan:   Problem List Items Addressed This Visit       Cardiovascular and Mediastinum   Essential hypertension, benign   BP Readings from Last 3 Encounters:  11/15/23 (!) 174/104  09/11/23 112/71  09/10/23 138/87    BP is elevated today but she reports being out of medicine for "some time" due to lack of insurance.   She denies dizziness, chest pain, edema We  will restart lisinopril today at previous dose. DASH diet advised engage in regular moderate exercises at least 150 minutes weekly Follow-up in 2 months       Relevant Medications   atorvastatin (LIPITOR) 20 MG tablet   lisinopril (ZESTRIL) 10 MG tablet   Other Relevant Orders   CMP14+EGFR     Endocrine   Uncontrolled type 2 diabetes mellitus with hyperglycemia (HCC) - Primary   Likely cause of her urinary symptoms.  Will check labs and urine today.  Restart medications at previous dosages.   Follow-up according to lab results. She has a follow-up appointment in 2 months.      Relevant Medications   Blood Glucose Monitoring Suppl (FREESTYLE LITE) w/Device KIT   insulin glargine-yfgn (SEMGLEE) 100 UNIT/ML Pen   Lancets (FREESTYLE) lancets   atorvastatin (LIPITOR) 20 MG tablet   glipiZIDE (GLUCOTROL XL) 5 MG 24 hr tablet   glucose blood (FREESTYLE LITE) test strip   lisinopril (ZESTRIL) 10 MG tablet   Other Relevant Orders   CMP14+EGFR   HgB A1c   Urine Microalbumin w/creat. ratio     Other   Mixed hyperlipidemia   Restart atorvastatin 20 mg.  Will recheck fasting labs at appointment in 2 months.      Relevant Medications   atorvastatin (LIPITOR) 20 MG tablet   lisinopril (ZESTRIL) 10 MG tablet   Other Relevant Orders   CMP14+EGFR   Other Visit Diagnoses       Abnormal urine odor       Relevant Orders   Urine Culture       No follow-ups on file.    Alison Irvine, FNP

## 2023-11-15 NOTE — Assessment & Plan Note (Signed)
 Likely cause of her urinary symptoms.  Will check labs and urine today.  Restart medications at previous dosages.   Follow-up according to lab results. She has a follow-up appointment in 2 months.

## 2023-11-15 NOTE — Telephone Encounter (Signed)
 Pt was seen in office today, Medications was addressed with Rice Chamorro, NP

## 2023-11-15 NOTE — Assessment & Plan Note (Addendum)
 BP Readings from Last 3 Encounters:  11/15/23 (!) 174/104  09/11/23 112/71  09/10/23 138/87    BP is elevated today but she reports being out of medicine for "some time" due to lack of insurance.   She denies dizziness, chest pain, edema We will restart lisinopril today at previous dose. DASH diet advised engage in regular moderate exercises at least 150 minutes weekly Follow-up in 2 months

## 2023-11-15 NOTE — Assessment & Plan Note (Signed)
 Restart atorvastatin 20 mg.  Will recheck fasting labs at appointment in 2 months.

## 2023-11-16 ENCOUNTER — Telehealth: Payer: Self-pay | Admitting: Family Medicine

## 2023-11-16 NOTE — Telephone Encounter (Signed)
 Copied from CRM [id number]. Topic: Clinical - Lab/Test Results >> Nov 16, 2023  8:55 AM Crispin Dolphin wrote: Reason for CRM: [facility] called with Critical result. [facility] disconnected while holding for clinician.

## 2023-11-17 LAB — CMP14+EGFR
ALT: 19 IU/L (ref 0–32)
AST: 17 IU/L (ref 0–40)
Albumin: 4 g/dL (ref 3.9–4.9)
Alkaline Phosphatase: 111 IU/L (ref 44–121)
BUN/Creatinine Ratio: 24 — ABNORMAL HIGH (ref 9–23)
BUN: 29 mg/dL — ABNORMAL HIGH (ref 6–24)
Bilirubin Total: 0.2 mg/dL (ref 0.0–1.2)
CO2: 20 mmol/L (ref 20–29)
Calcium: 9.3 mg/dL (ref 8.7–10.2)
Chloride: 96 mmol/L (ref 96–106)
Creatinine, Ser: 1.21 mg/dL — ABNORMAL HIGH (ref 0.57–1.00)
Globulin, Total: 2.5 g/dL (ref 1.5–4.5)
Glucose: 503 mg/dL (ref 70–99)
Potassium: 5 mmol/L (ref 3.5–5.2)
Sodium: 130 mmol/L — ABNORMAL LOW (ref 134–144)
Total Protein: 6.5 g/dL (ref 6.0–8.5)
eGFR: 56 mL/min/{1.73_m2} — ABNORMAL LOW (ref >59)

## 2023-11-17 LAB — HEMOGLOBIN A1C
Est. average glucose Bld gHb Est-mCnc: 384 mg/dL
Hgb A1c MFr Bld: 15 % — ABNORMAL HIGH (ref 4.8–5.6)

## 2023-11-17 LAB — MICROALBUMIN / CREATININE URINE RATIO
Creatinine, Urine: 29.5 mg/dL
Microalb/Creat Ratio: 411 mg/g{creat} — ABNORMAL HIGH (ref 0–29)
Microalbumin, Urine: 121.3 ug/mL

## 2023-11-17 NOTE — Telephone Encounter (Signed)
 Provider informed of critical lab

## 2023-11-20 ENCOUNTER — Other Ambulatory Visit: Payer: Self-pay | Admitting: Family Medicine

## 2023-11-20 LAB — URINE CULTURE

## 2023-11-20 MED ORDER — NITROFURANTOIN MONOHYD MACRO 100 MG PO CAPS: 100.0000 mg | ORAL_CAPSULE | Freq: Two times a day (BID) | ORAL | 0 refills | Status: AC

## 2024-01-25 ENCOUNTER — Ambulatory Visit

## 2024-01-29 ENCOUNTER — Other Ambulatory Visit: Payer: Self-pay | Admitting: Family Medicine

## 2024-01-29 ENCOUNTER — Ambulatory Visit (INDEPENDENT_AMBULATORY_CARE_PROVIDER_SITE_OTHER): Payer: Self-pay | Admitting: Family Medicine

## 2024-01-29 ENCOUNTER — Encounter: Payer: Self-pay | Admitting: Family Medicine

## 2024-01-29 VITALS — BP 126/82 | HR 99 | Ht 62.0 in | Wt 121.0 lb

## 2024-01-29 DIAGNOSIS — L03115 Cellulitis of right lower limb: Secondary | ICD-10-CM

## 2024-01-29 DIAGNOSIS — E1140 Type 2 diabetes mellitus with diabetic neuropathy, unspecified: Secondary | ICD-10-CM

## 2024-01-29 DIAGNOSIS — E1165 Type 2 diabetes mellitus with hyperglycemia: Secondary | ICD-10-CM | POA: Diagnosis not present

## 2024-01-29 DIAGNOSIS — L84 Corns and callosities: Secondary | ICD-10-CM

## 2024-01-29 DIAGNOSIS — Z794 Long term (current) use of insulin: Secondary | ICD-10-CM

## 2024-01-29 DIAGNOSIS — I10 Essential (primary) hypertension: Secondary | ICD-10-CM | POA: Diagnosis not present

## 2024-01-29 MED ORDER — GABAPENTIN 100 MG PO CAPS: 100.0000 mg | ORAL_CAPSULE | Freq: Three times a day (TID) | ORAL | 3 refills | Status: DC

## 2024-01-29 MED ORDER — DOXYCYCLINE HYCLATE 100 MG PO TABS: 100.0000 mg | ORAL_TABLET | Freq: Two times a day (BID) | ORAL | 0 refills | Status: AC

## 2024-01-29 MED ORDER — LANCET DEVICE MISC: 1.0000 | Freq: Three times a day (TID) | 0 refills | Status: AC

## 2024-01-29 MED ORDER — BLOOD GLUCOSE MONITORING SUPPL DEVI: 1.0000 | Freq: Three times a day (TID) | 0 refills | Status: DC

## 2024-01-29 MED ORDER — OZEMPIC (0.25 OR 0.5 MG/DOSE) 2 MG/3ML ~~LOC~~ SOPN: 0.2500 mg | PEN_INJECTOR | SUBCUTANEOUS | 0 refills | Status: DC

## 2024-01-29 MED ORDER — LANCETS MISC. MISC
1.0000 | Freq: Three times a day (TID) | 0 refills | Status: AC
Start: 2024-01-29 — End: 2024-02-28

## 2024-01-29 MED ORDER — BLOOD GLUCOSE TEST VI STRP: 1.0000 | ORAL_STRIP | Freq: Three times a day (TID) | 0 refills | Status: AC

## 2024-01-29 NOTE — Patient Instructions (Addendum)
 I appreciate the opportunity to provide care to you today!    Follow up:  3 months  Labs: please stop by the lab today to get your blood drawn (CBC, CMP, TSH, Lipid profile, HgA1c, Vit D)  Type II Diabetes:  Encouraged to start Ozempic 0.25 mg once weekly for blood sugar management. Please request medication refills on a monthly basis. Continue taking Glipizide  5 mg daily and monitor blood sugar levels at least three times per day.  Target blood glucose goals: -Fasting: 80-130 mg/dL -Before lunch and dinner: <140 mg/dL -Two hours after meals: <180 mg/dL  Please report to the emergency department if blood sugar levels are consistently above 250 mg/dL, especially if accompanied by symptoms such as nausea, vomiting, abdominal pain, fruity-smelling breath, blurred vision, unintentional weight loss, or confusion.   Foot callus a referral has been placed to podiatry Home treatment includes: -Soak the corn or callus in warm water. Do this for about five to 10 minutes or until the skin softens. -File the corn or callus with a pumice stone. First dip the pumice stone in warm water, and then use the stone to gently file the corn or callus. Use circular or sideways motions to remove dead skin. -Be careful not to take off too much skin. Doing so could cause bleeding and infection Please continue to a heart-healthy diet and increase your physical activities. Try to exercise for at least three times a week.  -Apply moisturizing lotion or cream to the area daily. Look for a moisturizing lotion or cream with salicylic acid, ammonium lactate, or urea. These ingredients will help gradually soften hard corns and calluses. -Use padding. To protect calluses from further irritation during activity, cut a piece of moleskin - available at your local drugstore - into two half-moon shapes and place around the callus.     Hypertension: -Please check your blood pressure daily. If yourr blood pressure is  less than 90/60 mmHg, please hold  your blood pressure medication for that day.  I recommend starting Lisinopril  5 mg daily (half of a 10 mg tablet).  Additionally, please follow a low-sodium diet and increase physical activity as tolerated to support blood pressure control.  Please follow up if your symptoms worsen or fail to improve.   Please stop by your local pharmacy and get your Tdap and Shingles vaccine  Referrals today-endocrinology, podiatry  Attached with your AVS, you will find valuable resources for self-education. I highly recommend dedicating some time to thoroughly examine them.   Please continue to a heart-healthy diet and increase your physical activities. Try to exercise for at least five days a week.    It was a pleasure to see you and I look forward to continuing to work together on your health and well-being. Please do not hesitate to call the office if you need care or have questions about your care.  In case of emergency, please visit the Emergency Department for urgent care, or contact our clinic at [phone] to schedule an appointment. We're here to help you!   Have a wonderful day and week. With Gratitude, Shatasha Lambing MSN, FNP-BC

## 2024-01-29 NOTE — Assessment & Plan Note (Signed)
 Will treat prophylactically giving physical exam findings with doxycycline hundred milligrams twice daily for seven days

## 2024-01-29 NOTE — Progress Notes (Signed)
 Established Patient Office Visit  Subjective:  Patient ID: Anne Wright, female    DOB: February 26, 1976  Age: 48 y.o. MRN: [id number]  CC:  Chief Complaint  Patient presents with   Hypertension    Follow up, patient has not been taking Lisinopril  due to making her blood pressure drop too low.   Foot Injury    Foot injury with numbness on right foot    HPI Anne Wright is a 48 y.o. female with past medical history of hypertension, uncontrolled type II diabetes and anxiety presents for f/u of  chronic medical conditions.    [facility] [address]  Past Medical History:  Diagnosis Date   Diabetes mellitus without complication (HCC)    High cholesterol    Hypertension     No past surgical history on file.  Family History  Problem Relation Age of Onset   Diabetes Mother    Hypertension Mother    Asthma Brother    Breast cancer Maternal Grandmother    Asthma Son    Colon cancer Neg Hx    Cancer - Lung Neg Hx    Cervical cancer Neg Hx     Social History   Socioeconomic History   Marital status: Single    Spouse name: Not on file   Number of children: 2   Years of education: Not on file   Highest education level: Not on file  Occupational History   Not on file  Tobacco Use   Smoking status: Former    Current packs/day: 0.00    Types: Cigarettes    Quit date: 06/29/2018    Years since quitting: 5.5    Passive exposure: Past   Smokeless tobacco: Never   Tobacco comments:    She smoked 10 cigarettes a day for 15 years , quit 2019.   Vaping Use   Vaping status: Never Used  Substance and Sexual Activity   Alcohol use: Yes    Comment: occa   Drug use: No   Sexual activity: Not Currently  Other Topics Concern   Not on file  Social History Narrative   Lives with her son.    Social Drivers of Corporate investment banker Strain: Not on file  Food Insecurity: Not on file  Transportation Needs: Not on file  Physical Activity: Not on file  Stress: Not  on file  Social Connections: Not on file  Intimate Partner Violence: Not on file    Outpatient Medications Prior to Visit  Medication Sig Dispense Refill   aspirin EC 81 MG tablet Take 81 mg by mouth daily.     atorvastatin  (LIPITOR) 20 MG tablet Take 1 tablet (20 mg total) by mouth daily. 90 tablet 1   Blood Glucose Monitoring Suppl (FREESTYLE LITE) w/Device KIT Use to monitor glucose 4 times a day 1 kit 0   glipiZIDE  (GLUCOTROL  XL) 5 MG 24 hr tablet Take 1 tablet (5 mg total) by mouth daily with breakfast. 90 tablet 1   glucose blood (FREESTYLE LITE) test strip Use to monitor glucose 4 times a day as instructed 150 each 2   insulin  glargine-yfgn (SEMGLEE ) 100 UNIT/ML Pen Inject 20 Units into the skin at bedtime. 15 mL 1   Insulin  Pen Needle (BD PEN NEEDLE NANO U/F) 32G X 4 MM MISC Use once daily 100 each 3   Lancets (FREESTYLE) lancets Test blood glucose four times daily. Use as instructed 100 each 12   lisinopril  (ZESTRIL ) 10 MG tablet Take 1 tablet (  10 mg total) by mouth daily. (Patient not taking: Reported on 01/29/2024) 90 tablet 1   loperamide  (IMODIUM ) 2 MG capsule Take 1 capsule (2 mg total) by mouth 4 (four) times daily as needed for diarrhea or loose stools. (Patient not taking: Reported on 11/15/2023) 12 capsule 0   naproxen  (NAPROSYN ) 500 MG tablet Take 1 tablet (500 mg total) by mouth 2 (two) times daily. (Patient not taking: Reported on 11/15/2023) 30 tablet 0   omeprazole  (PRILOSEC) 40 MG capsule Take 1 capsule (40 mg total) by mouth in the morning and at bedtime. 60 capsule 11   ondansetron  (ZOFRAN -ODT) 4 MG disintegrating tablet Take 1 tablet (4 mg total) by mouth every 8 (eight) hours as needed for nausea or vomiting. (Patient not taking: Reported on 11/15/2023) 20 tablet 0   No facility-administered medications prior to visit.    Allergies  Allergen Reactions   Amoxicillin    Shellfish Allergy     ROS Review of Systems  Constitutional:  Negative for chills and fever.   Eyes:  Negative for visual disturbance.  Respiratory:  Negative for chest tightness and shortness of breath.   Skin:        Foot callus   Neurological:  Negative for dizziness and headaches.      Objective:    Physical Exam HENT:     Head: Normocephalic.     Mouth/Throat:     Mouth: Mucous membranes are moist.   Cardiovascular:     Rate and Rhythm: Normal rate.     Heart sounds: Normal heart sounds.  Pulmonary:     Effort: Pulmonary effort is normal.     Breath sounds: Normal breath sounds.   Musculoskeletal:       Feet:  Feet:     Right foot:     Skin integrity: Callus present.     Comments: callus and visible red streaking on the right fifth toe  Neurological:     Mental Status: She is alert.     BP 126/82   Pulse 99   Ht 5' 2 (1.575 m)   Wt 121 lb (54.9 kg)   SpO2 99%   BMI 22.13 kg/m  Wt Readings from Last 3 Encounters:  01/29/24 121 lb (54.9 kg)  11/15/23 125 lb 1.3 oz (56.7 kg)  09/11/23 150 lb (68 kg)    Lab Results  Component Value Date   TSH 1.680 09/22/2022   Lab Results  Component Value Date   WBC 9.5 09/11/2023   HGB 13.8 09/11/2023   HCT 41.7 09/11/2023   MCV 95.0 09/11/2023   PLT 171 09/11/2023   Lab Results  Component Value Date   NA 130 (L) 11/15/2023   K 5.0 11/15/2023   CO2 20 11/15/2023   GLUCOSE 503 (HH) 11/15/2023   BUN 29 (H) 11/15/2023   CREATININE 1.21 (H) 11/15/2023   BILITOT 0.2 11/15/2023   ALKPHOS 111 11/15/2023   AST 17 11/15/2023   ALT 19 11/15/2023   PROT 6.5 11/15/2023   ALBUMIN 4.0 11/15/2023   CALCIUM  9.3 11/15/2023   ANIONGAP 15 09/11/2023   EGFR 56 (L) 11/15/2023   Lab Results  Component Value Date   CHOL 153 09/22/2022   Lab Results  Component Value Date   HDL 63 09/22/2022   Lab Results  Component Value Date   LDLCALC 78 09/22/2022   Lab Results  Component Value Date   TRIG 56 09/22/2022   Lab Results  Component Value Date   CHOLHDL 2.4  09/22/2022   Lab Results  Component  Value Date   HGBA1C 15.0 (H) 11/15/2023      Assessment & Plan:  Uncontrolled type 2 diabetes mellitus with hyperglycemia (HCC) Assessment & Plan: The patient reports elevated blood sugar levels in the 200s and states she was unable to pick up her previously prescribed Semglee . She has only been taking Glipizide  5 mg daily.  A sample of Ozempic 0.25 mg weekly was provided, and a prescription has been called in to the pharmacy. The patient is encouraged to continue taking Glipizide  5 mg daily.  Given her hemoglobin A1c of 15.0, a referral to endocrinology has been placed for collaborative care.  The patient is advised to check her blood sugar at least three times daily, and her target blood sugar goals were reviewed during the visit.  She is instructed to report to the emergency department if her blood sugar remains consistently above 250 mg/dL, especially if she experiences symptoms such as nausea, vomiting, abdominal pain, fruity-smelling breath, blurred vision, weight loss, or confusion.     Orders: -     Ozempic (0.25 or 0.5 MG/DOSE); Inject 0.25 mg into the skin once a week.  Dispense: 3 mL; Refill: 0 -     AMB Referral VBCI Care Management -     Blood Glucose Monitoring Suppl; 1 each by Does not apply route in the morning, at noon, and at bedtime. May substitute to any manufacturer covered by patient's insurance.  Dispense: 1 each; Refill: 0 -     Blood Glucose Test; 1 each by In Vitro route in the morning, at noon, and at bedtime. May substitute to any manufacturer covered by patient's insurance.  Dispense: 100 strip; Refill: 0 -     Lancet Device; 1 each by Does not apply route in the morning, at noon, and at bedtime. May substitute to any manufacturer covered by patient's insurance.  Dispense: 1 each; Refill: 0 -     Lancets Misc.; 1 each by Does not apply route in the morning, at noon, and at bedtime. May substitute to any manufacturer covered by patient's insurance.  Dispense:  100 each; Refill: 0 -     Ambulatory referral to Endocrinology  Essential hypertension, benign Assessment & Plan: Encouraged the patient to check her blood pressure daily. If her blood pressure is less than 90/60 mmHg, she is advised to  hold her blood pressure medication for that day.  I recommend starting Lisinopril  5 mg daily (half of a 10 mg tablet).   Callus of foot Assessment & Plan: A referral has been placed to podiatry. Given the recurrent issues with her right fifth toe, she will be treated prophylactically for cellulitis with doxycycline twice daily for seven days.  Nonpharmacological interventions were reviewed and discussed, and the patient verbalized understanding.   Orders: -     Ambulatory referral to Podiatry  Cellulitis of right foot Assessment & Plan: Will treat prophylactically giving physical exam findings with doxycycline hundred milligrams twice daily for seven days  Orders: -     Doxycycline Hyclate; Take 1 tablet (100 mg total) by mouth 2 (two) times daily for 7 days.  Dispense: 14 tablet; Refill: 0  Type 2 diabetes mellitus with diabetic neuropathy, unspecified whether long term insulin  use (HCC) Assessment & Plan: Will initiate therapy on gabapentin hundred milligrams three times daily  Orders: -     Gabapentin; Take 1 capsule (100 mg total) by mouth 3 (three) times daily.  Dispense: 90 capsule; Refill:  3  Note: This chart has been completed using Engineer, civil (consulting) software, and while attempts have been made to ensure accuracy, certain words and phrases may not be transcribed as intended.    Follow-up: Return in about 3 months (around 04/30/2024).   Plumer Mittelstaedt, FNP

## 2024-01-29 NOTE — Assessment & Plan Note (Signed)
 Will initiate therapy on gabapentin hundred milligrams three times daily

## 2024-01-29 NOTE — Assessment & Plan Note (Addendum)
 The patient reports elevated blood sugar levels in the 200s and states she was unable to pick up her previously prescribed Semglee . She has only been taking Glipizide  5 mg daily.  A sample of Ozempic 0.25 mg weekly was provided, and a prescription has been called in to the pharmacy. The patient is encouraged to continue taking Glipizide  5 mg daily.  Given her hemoglobin A1c of 15.0, a referral to endocrinology has been placed for collaborative care.  The patient is advised to check her blood sugar at least three times daily, and her target blood sugar goals were reviewed during the visit.  She is instructed to report to the emergency department if her blood sugar remains consistently above 250 mg/dL, especially if she experiences symptoms such as nausea, vomiting, abdominal pain, fruity-smelling breath, blurred vision, weight loss, or confusion.

## 2024-01-29 NOTE — Assessment & Plan Note (Signed)
 Encouraged the patient to check her blood pressure daily. If her blood pressure is less than 90/60 mmHg, she is advised to  hold her blood pressure medication for that day.  I recommend starting Lisinopril  5 mg daily (half of a 10 mg tablet).

## 2024-01-29 NOTE — Assessment & Plan Note (Signed)
 A referral has been placed to podiatry. Given the recurrent issues with her right fifth toe, she will be treated prophylactically for cellulitis with doxycycline twice daily for seven days.  Nonpharmacological interventions were reviewed and discussed, and the patient verbalized understanding.

## 2024-01-30 ENCOUNTER — Telehealth: Payer: Self-pay

## 2024-01-30 NOTE — Telephone Encounter (Signed)
 Called patient, she did not answer , could not leave vm

## 2024-01-30 NOTE — Progress Notes (Signed)
 Care Guide Pharmacy Note  01/30/2024 Name: Anne Wright MRN: [id number] DOB: September 25, 1975  Referred By: [name], [name], FNP Reason for referral: Complex Care Management (Outreach to schedule with Pharm d )   Anne Wright is a 48 y.o. year old female who is a primary care patient of [name], [name], FNP.  Anne Wright was referred to the pharmacist for assistance related to: DMII  An unsuccessful telephone outreach was attempted today to contact the patient who was referred to the pharmacy team for assistance with medication assistance. Additional attempts will be made to contact the patient.  Jeoffrey Buffalo , RMA     [facility]  [facility], [facility] Guide  Direct Dial: [phone]  Website: [email or web address]

## 2024-02-06 ENCOUNTER — Telehealth: Payer: Self-pay

## 2024-02-06 NOTE — Progress Notes (Unsigned)
 Care Guide Pharmacy Note  02/06/2024 Name: Buffey Zabinski MRN: [id number] DOB: 03/24/1976  Referred By: [name], [name], FNP Reason for referral: Complex Care Management (Outreach to schedule with Pharm d )   Aariah Beals is a 48 y.o. year old female who is a primary care patient of [name], [name], FNP.  Chrystina Scantling was referred to the pharmacist for assistance related to: HLD  A second unsuccessful telephone outreach was attempted today to contact the patient who was referred to the pharmacy team for assistance with medication assistance. Additional attempts will be made to contact the patient.  Jeoffrey Buffalo , RMA     [facility]  [facility], [facility] Guide  Direct Dial: [phone]  Website: [email or web address]

## 2024-02-06 NOTE — Telephone Encounter (Signed)
 Copied from CRM [id number]. Topic: Clinical - Prescription Issue >> Feb 06, 2024  9:44 AM Gustabo D wrote: insulin  glargine (LANTUS  SOLOSTAR) 100 UNIT/ML Solostar Pen (Discontinued), Semaglutide ,0.25 or 0.5MG /DOS, (OZEMPIC , 0.25 OR 0.5 MG/DOSE,) 2 MG/3ML SOPN- Patient she went to the pharmacy and they don't have her prescriptions. She says there are others she is suppose to get but she don't know the names of them. Please call her back. [phone]

## 2024-02-07 NOTE — Telephone Encounter (Signed)
 Left detailed message stating medication was sent in and pharmacy confirmed the receipt of rx

## 2024-02-07 NOTE — Progress Notes (Signed)
 Care Guide Pharmacy Note  02/07/2024 Name: Anne Wright MRN: [id number] DOB: 07-15-1976  Referred By: [name], [name], FNP Reason for referral: Complex Care Management (Outreach to schedule with Pharm d )   Anne Wright is a 48 y.o. year old female who is a primary care patient of [name], [name], FNP.  Anne Wright was referred to the pharmacist for assistance related to: HLD  A third unsuccessful telephone outreach was attempted today to contact the patient who was referred to the pharmacy team for assistance with medication assistance. The Population Health team is pleased to engage with this patient at any time in the future upon receipt of referral and should he/she be interested in assistance from the [facility] team.  Anne Wright , RMA     [facility]  [facility], [facility] Guide  Direct Dial: [phone]  Website: [email or web address]

## 2024-02-26 ENCOUNTER — Ambulatory Visit: Payer: Self-pay | Admitting: "Endocrinology

## 2024-03-11 ENCOUNTER — Ambulatory Visit (INDEPENDENT_AMBULATORY_CARE_PROVIDER_SITE_OTHER): Payer: Self-pay | Admitting: "Endocrinology

## 2024-03-11 ENCOUNTER — Encounter: Payer: Self-pay | Admitting: "Endocrinology

## 2024-03-11 ENCOUNTER — Inpatient Hospital Stay (HOSPITAL_COMMUNITY)
Admission: EM | Admit: 2024-03-11 | Discharge: 2024-03-16 | DRG: 271 | Disposition: A | Discharge status: Home / Self Care | Payer: Self-pay | Source: Ambulatory Visit | Attending: Family Medicine | Admitting: Family Medicine

## 2024-03-11 ENCOUNTER — Encounter (HOSPITAL_COMMUNITY): Payer: Self-pay

## 2024-03-11 ENCOUNTER — Other Ambulatory Visit: Payer: Self-pay | Admitting: Family Medicine

## 2024-03-11 ENCOUNTER — Other Ambulatory Visit: Payer: Self-pay

## 2024-03-11 ENCOUNTER — Emergency Department (HOSPITAL_COMMUNITY): Payer: Self-pay

## 2024-03-11 VITALS — BP 148/82 | HR 92 | Ht 62.0 in | Wt 121.6 lb

## 2024-03-11 DIAGNOSIS — E11621 Type 2 diabetes mellitus with foot ulcer: Secondary | ICD-10-CM | POA: Diagnosis present

## 2024-03-11 DIAGNOSIS — E1152 Type 2 diabetes mellitus with diabetic peripheral angiopathy with gangrene: Principal | ICD-10-CM | POA: Diagnosis present

## 2024-03-11 DIAGNOSIS — E876 Hypokalemia: Secondary | ICD-10-CM | POA: Diagnosis present

## 2024-03-11 DIAGNOSIS — Z87891 Personal history of nicotine dependence: Secondary | ICD-10-CM

## 2024-03-11 DIAGNOSIS — E7800 Pure hypercholesterolemia, unspecified: Secondary | ICD-10-CM | POA: Diagnosis present

## 2024-03-11 DIAGNOSIS — L089 Local infection of the skin and subcutaneous tissue, unspecified: Principal | ICD-10-CM | POA: Diagnosis present

## 2024-03-11 DIAGNOSIS — E1140 Type 2 diabetes mellitus with diabetic neuropathy, unspecified: Secondary | ICD-10-CM

## 2024-03-11 DIAGNOSIS — E1165 Type 2 diabetes mellitus with hyperglycemia: Secondary | ICD-10-CM

## 2024-03-11 DIAGNOSIS — I10 Essential (primary) hypertension: Secondary | ICD-10-CM

## 2024-03-11 DIAGNOSIS — D500 Iron deficiency anemia secondary to blood loss (chronic): Secondary | ICD-10-CM | POA: Diagnosis present

## 2024-03-11 DIAGNOSIS — K760 Fatty (change of) liver, not elsewhere classified: Secondary | ICD-10-CM | POA: Diagnosis present

## 2024-03-11 DIAGNOSIS — E1122 Type 2 diabetes mellitus with diabetic chronic kidney disease: Secondary | ICD-10-CM | POA: Diagnosis present

## 2024-03-11 DIAGNOSIS — E871 Hypo-osmolality and hyponatremia: Secondary | ICD-10-CM | POA: Diagnosis present

## 2024-03-11 DIAGNOSIS — M869 Osteomyelitis, unspecified: Secondary | ICD-10-CM | POA: Diagnosis present

## 2024-03-11 DIAGNOSIS — Z7984 Long term (current) use of oral hypoglycemic drugs: Secondary | ICD-10-CM | POA: Diagnosis not present

## 2024-03-11 DIAGNOSIS — E872 Acidosis, unspecified: Secondary | ICD-10-CM | POA: Diagnosis not present

## 2024-03-11 DIAGNOSIS — E782 Mixed hyperlipidemia: Secondary | ICD-10-CM

## 2024-03-11 DIAGNOSIS — Z825 Family history of asthma and other chronic lower respiratory diseases: Secondary | ICD-10-CM

## 2024-03-11 DIAGNOSIS — E559 Vitamin D deficiency, unspecified: Secondary | ICD-10-CM

## 2024-03-11 DIAGNOSIS — N1831 Chronic kidney disease, stage 3a: Secondary | ICD-10-CM | POA: Diagnosis present

## 2024-03-11 DIAGNOSIS — L97519 Non-pressure chronic ulcer of other part of right foot with unspecified severity: Secondary | ICD-10-CM | POA: Diagnosis present

## 2024-03-11 DIAGNOSIS — I9581 Postprocedural hypotension: Secondary | ICD-10-CM | POA: Diagnosis not present

## 2024-03-11 DIAGNOSIS — Z91128 Patient's intentional underdosing of medication regimen for other reason: Secondary | ICD-10-CM

## 2024-03-11 DIAGNOSIS — Z7982 Long term (current) use of aspirin: Secondary | ICD-10-CM

## 2024-03-11 DIAGNOSIS — I70261 Atherosclerosis of native arteries of extremities with gangrene, right leg: Secondary | ICD-10-CM | POA: Diagnosis present

## 2024-03-11 DIAGNOSIS — Z794 Long term (current) use of insulin: Secondary | ICD-10-CM

## 2024-03-11 DIAGNOSIS — Z881 Allergy status to other antibiotic agents status: Secondary | ICD-10-CM

## 2024-03-11 DIAGNOSIS — Z803 Family history of malignant neoplasm of breast: Secondary | ICD-10-CM

## 2024-03-11 DIAGNOSIS — E785 Hyperlipidemia, unspecified: Secondary | ICD-10-CM | POA: Insufficient documentation

## 2024-03-11 DIAGNOSIS — T383X6A Underdosing of insulin and oral hypoglycemic [antidiabetic] drugs, initial encounter: Secondary | ICD-10-CM | POA: Diagnosis present

## 2024-03-11 DIAGNOSIS — Z833 Family history of diabetes mellitus: Secondary | ICD-10-CM

## 2024-03-11 DIAGNOSIS — E11628 Type 2 diabetes mellitus with other skin complications: Secondary | ICD-10-CM | POA: Diagnosis present

## 2024-03-11 DIAGNOSIS — M86171 Other acute osteomyelitis, right ankle and foot: Secondary | ICD-10-CM

## 2024-03-11 DIAGNOSIS — E1169 Type 2 diabetes mellitus with other specified complication: Secondary | ICD-10-CM | POA: Diagnosis present

## 2024-03-11 DIAGNOSIS — Z89421 Acquired absence of other right toe(s): Secondary | ICD-10-CM

## 2024-03-11 DIAGNOSIS — L03031 Cellulitis of right toe: Secondary | ICD-10-CM | POA: Diagnosis present

## 2024-03-11 DIAGNOSIS — Z8249 Family history of ischemic heart disease and other diseases of the circulatory system: Secondary | ICD-10-CM

## 2024-03-11 DIAGNOSIS — Z91013 Allergy to seafood: Secondary | ICD-10-CM

## 2024-03-11 DIAGNOSIS — Z79899 Other long term (current) drug therapy: Secondary | ICD-10-CM

## 2024-03-11 DIAGNOSIS — I129 Hypertensive chronic kidney disease with stage 1 through stage 4 chronic kidney disease, or unspecified chronic kidney disease: Secondary | ICD-10-CM | POA: Diagnosis present

## 2024-03-11 DIAGNOSIS — E1142 Type 2 diabetes mellitus with diabetic polyneuropathy: Secondary | ICD-10-CM | POA: Diagnosis present

## 2024-03-11 LAB — CBC WITH DIFFERENTIAL/PLATELET
Abs Immature Granulocytes: 0.03 K/uL (ref 0.00–0.07)
Basophils Absolute: 0.1 K/uL (ref 0.0–0.1)
Basophils Relative: 1 %
Eosinophils Absolute: 0.1 K/uL (ref 0.0–0.5)
Eosinophils Relative: 1 %
HCT: 40.6 % (ref 36.0–46.0)
Hemoglobin: 13.4 g/dL (ref 12.0–15.0)
Immature Granulocytes: 0 %
Lymphocytes Relative: 27 %
Lymphs Abs: 2.4 K/uL (ref 0.7–4.0)
MCH: 31.3 pg (ref 26.0–34.0)
MCHC: 33 g/dL (ref 30.0–36.0)
MCV: 94.9 fL (ref 80.0–100.0)
Monocytes Absolute: 0.8 K/uL (ref 0.1–1.0)
Monocytes Relative: 9 %
Neutro Abs: 5.5 K/uL (ref 1.7–7.7)
Neutrophils Relative %: 62 %
Platelets: 326 K/uL (ref 150–400)
RBC: 4.28 MIL/uL (ref 3.87–5.11)
RDW: 13.3 % (ref 11.5–15.5)
WBC: 8.8 K/uL (ref 4.0–10.5)
nRBC: 0 % (ref 0.0–0.2)

## 2024-03-11 LAB — HCG, QUANTITATIVE, PREGNANCY: hCG, Beta Chain, Quant, S: <1 m[IU]/mL (ref <5)

## 2024-03-11 LAB — COMPREHENSIVE METABOLIC PANEL WITH GFR
ALT: 24 U/L (ref 0–44)
AST: 29 U/L (ref 15–41)
Albumin: 3.7 g/dL (ref 3.5–5.0)
Alkaline Phosphatase: 77 U/L (ref 38–126)
Anion gap: 12 (ref 5–15)
BUN: 24 mg/dL — ABNORMAL HIGH (ref 6–20)
CO2: 21 mmol/L — ABNORMAL LOW (ref 22–32)
Calcium: 9.2 mg/dL (ref 8.9–10.3)
Chloride: 100 mmol/L (ref 98–111)
Creatinine, Ser: 1.01 mg/dL — ABNORMAL HIGH (ref 0.44–1.00)
GFR, Estimated: >60 mL/min (ref >60)
Glucose, Bld: 247 mg/dL — ABNORMAL HIGH (ref 70–99)
Potassium: 4.9 mmol/L (ref 3.5–5.1)
Sodium: 133 mmol/L — ABNORMAL LOW (ref 135–145)
Total Bilirubin: 0.7 mg/dL (ref 0.0–1.2)
Total Protein: 7.4 g/dL (ref 6.5–8.1)

## 2024-03-11 LAB — CBG MONITORING, ED: Glucose-Capillary: 303 mg/dL — ABNORMAL HIGH (ref 70–99)

## 2024-03-11 LAB — POCT GLYCOSYLATED HEMOGLOBIN (HGB A1C): HbA1c, POC (controlled diabetic range): 12.6 % — AB (ref 0.0–7.0)

## 2024-03-11 MED ORDER — ACCU-CHEK GUIDE TEST VI STRP
ORAL_STRIP | 3 refills | Status: DC
Start: 2024-03-11 — End: 2024-03-16

## 2024-03-11 MED ORDER — INSULIN GLARGINE-YFGN 100 UNIT/ML ~~LOC~~ SOPN: 20.0000 [IU] | PEN_INJECTOR | Freq: Every day | SUBCUTANEOUS | 1 refills | Status: DC

## 2024-03-11 MED ORDER — BD PEN NEEDLE NANO U/F 32G X 4 MM MISC: Freq: Every day | 3 refills | Status: DC

## 2024-03-11 MED ORDER — SODIUM CHLORIDE 0.9 % IV BOLUS
1000.0000 mL | Freq: Once | INTRAVENOUS | Status: AC
  Administered 2024-03-11 (×2): 1000 mL via INTRAVENOUS

## 2024-03-11 MED ORDER — FREESTYLE LIBRE 3 READER DEVI
1.0000 | Freq: Once | 0 refills | Status: DC | PRN
Start: 2024-03-11 — End: 2024-03-16

## 2024-03-11 MED ORDER — LISINOPRIL 5 MG PO TABS
5.0000 mg | ORAL_TABLET | Freq: Once | ORAL | Status: AC
  Administered 2024-03-11 (×2): 5 mg via ORAL
  Filled 2024-03-11: qty 1

## 2024-03-11 MED ORDER — ACCU-CHEK GUIDE ME W/DEVICE KIT
1.0000 | PACK | 0 refills | Status: DC
Start: 2024-03-11 — End: 2024-03-16

## 2024-03-11 MED ORDER — FREESTYLE LIBRE 3 PLUS SENSOR MISC
2 refills | Status: DC
Start: 2024-03-11 — End: 2024-03-16

## 2024-03-11 NOTE — Telephone Encounter (Signed)
 Copied from CRM #[id number]. Topic: Clinical - Medication Refill >> Mar 11, 2024 11:35 AM Edsel HERO wrote: Medication:  insulin  glargine-yfgn (SEMGLEE ) 100 UNIT/ML Pen Insulin  Pen Needle (BD PEN NEEDLE NANO U/F) 32G X 4 MM MISC  Has the patient contacted their pharmacy? No  This is the patient's preferred pharmacy:  [id number] - [place], [place] - [address] [facility] [address] [place] [address] Phone: [phone] Fax: [phone]  Is this the correct pharmacy for this prescription? Yes If no, delete pharmacy and type the correct one.   Has the prescription been filled recently? Yes  Is the patient out of the medication? Yes  Has the patient been seen for an appointment in the last year OR does the patient have an upcoming appointment? Yes  Can we respond through MyChart? Yes

## 2024-03-11 NOTE — ED Triage Notes (Signed)
 Pt comes in for right pinky toe infection. Pt was at a endocrinologist appointment and was told to come here to rule out deep cellulitis or osteomyelitis fin the toe. Pt is A&Ox4. PMS intact.

## 2024-03-11 NOTE — ED Provider Notes (Signed)
 [place] [facility]MENT AT [facility] Provider Note   CSN: [id number] Arrival date & time: 03/11/24  1412     Patient presents with: Foot Pain (Infection in right pinky toe)   Anne Wright is a 48 y.o. female with a past medical history of uncontrolled insulin -dependent type 2 DM, HLD, HTN, tobacco abuse presents emerged department for evaluation of right little toe infection.  Reports that she hit it 2 weeks ago and it has been swelling, becoming erythematous since.  Today, sought ED evaluation as endocrinologist evaluated toe and recommended ED evaluation to rule out osteomyelitis, infection  {Add pertinent medical, surgical, social history, OB history to HPI:32947}  Foot Pain      Prior to Admission medications   Medication Sig Start Date End Date Taking? Authorizing Provider  aspirin  EC 81 MG tablet Take 81 mg by mouth daily.    [provider]  atorvastatin  (LIPITOR) 20 MG tablet Take 1 tablet (20 mg total) by mouth daily. 11/15/23   Bevely Doffing, FNP  Blood Glucose Monitoring Suppl (ACCU-CHEK GUIDE ME) w/Device KIT 1 Piece by Does not apply route as directed. 03/11/24   Lenis Ethelle ORN, MD  Continuous Glucose Receiver (FREESTYLE LIBRE 3 READER) DEVI 1 Piece by Does not apply route once as needed for up to 1 dose. 03/11/24   Nida, Gebreselassie W, MD  Continuous Glucose Sensor (FREESTYLE LIBRE 3 PLUS SENSOR) MISC Change sensor every 15 days. 03/11/24   Nida, Gebreselassie W, MD  gabapentin  (NEURONTIN ) 100 MG capsule Take 1 capsule (100 mg total) by mouth 3 (three) times daily. 01/29/24   [name], [name], FNP  glipiZIDE  (GLUCOTROL  XL) 5 MG 24 hr tablet Take 1 tablet (5 mg total) by mouth daily with breakfast. 11/15/23   Bevely Doffing, FNP  glucose blood (ACCU-CHEK GUIDE TEST) test strip Use to monitor glucose 4 times daily  as instructed 03/11/24   Nida, Gebreselassie W, MD  insulin  glargine-yfgn (SEMGLEE ) 100 UNIT/ML Pen Inject 20 Units into the  skin at bedtime. 03/11/24   Nida, Gebreselassie W, MD  Insulin  Pen Needle (BD PEN NEEDLE NANO U/F) 32G X 4 MM MISC Use once daily 03/11/24   [name], [name], FNP  Lancets (FREESTYLE) lancets Test blood glucose four times daily. Use as instructed 11/15/23   Bevely Doffing, FNP  lisinopril  (ZESTRIL ) 10 MG tablet Take 1 tablet (10 mg total) by mouth daily. Patient not taking: Reported on 01/29/2024 11/15/23   Bevely Doffing, FNP  loperamide  (IMODIUM ) 2 MG capsule Take 1 capsule (2 mg total) by mouth 4 (four) times daily as needed for diarrhea or loose stools. Patient not taking: Reported on 11/15/2023 09/10/23   Theadore Ozell HERO, MD  naproxen  (NAPROSYN ) 500 MG tablet Take 1 tablet (500 mg total) by mouth 2 (two) times daily. Patient not taking: Reported on 11/15/2023 09/11/23   Minnie Tinnie BRAVO, PA  omeprazole  (PRILOSEC) 40 MG capsule Take 1 capsule (40 mg total) by mouth in the morning and at bedtime. 09/07/22 09/07/23  Cindie Carlin POUR, DO  ondansetron  (ZOFRAN -ODT) 4 MG disintegrating tablet Take 1 tablet (4 mg total) by mouth every 8 (eight) hours as needed for nausea or vomiting. Patient not taking: Reported on 11/15/2023 09/11/23   Minnie Tinnie BRAVO, PA  insulin  glargine (LANTUS  SOLOSTAR) 100 UNIT/ML Solostar Pen Inject 24 Units into the skin at bedtime. 06/30/21 05/11/22  Nida, Gebreselassie W, MD    Allergies: Amoxicillin and Shellfish allergy    Review of Systems  Musculoskeletal:  Positive for  arthralgias.    Updated Vital Signs BP (!) 193/123   Pulse (!) 102   Temp 98.6 F (37 C) (Oral)   Resp 18   Ht 5' 2 (1.575 m)   Wt 54.9 kg   LMP 02/18/2024 (Exact Date)   SpO2 100%   BMI 22.13 kg/m   Physical Exam Vitals and nursing note reviewed.  Constitutional:      General: She is not in acute distress.    Appearance: Normal appearance.  HENT:     Head: Normocephalic and atraumatic.  Eyes:     Conjunctiva/sclera: Conjunctivae normal.  Cardiovascular:     Rate and Rhythm: Normal rate.   Pulmonary:     Effort: Pulmonary effort is normal. No respiratory distress.  Skin:    Coloration: Skin is not jaundiced or pale.  Neurological:     Mental Status: She is alert. Mental status is at baseline.        (all labs ordered are listed, but only abnormal results are displayed) Labs Reviewed  COMPREHENSIVE METABOLIC PANEL WITH GFR - Abnormal; Notable for the following components:      Result Value   Sodium 133 (*)    CO2 21 (*)    Glucose, Bld 247 (*)    BUN 24 (*)    Creatinine, Ser 1.01 (*)    All other components within normal limits  CBG MONITORING, ED - Abnormal; Notable for the following components:   Glucose-Capillary 303 (*)    All other components within normal limits  CBC WITH DIFFERENTIAL/PLATELET  HCG, QUANTITATIVE, PREGNANCY    EKG: None  Radiology: DG Foot Complete Right Result Date: 03/11/2024 CLINICAL DATA:  Possible infected ulceration. EXAM: RIGHT FOOT COMPLETE - 3+ VIEW COMPARISON:  None Available. FINDINGS: There is no evidence of fracture or dislocation. There is no evidence of arthropathy or other focal bone abnormality. There is soft tissue swelling lateral to the fifth metatarsophalangeal joint. No evidence for foreign body. IMPRESSION: Soft tissue swelling lateral to the fifth metatarsophalangeal joint. No evidence for foreign body or acute osseous abnormality. Electronically Signed   By: Greig Pique M.D.   On: 03/11/2024 17:22    {Document cardiac monitor, telemetry assessment procedure when appropriate:32947} Procedures   Medications Ordered in the ED - No data to display    {Click here for ABCD2, HEART and other calculators REFRESH Note before signing:1}                              Medical Decision Making Amount and/or Complexity of Data Reviewed Labs: ordered. Radiology: ordered.  Risk Prescription drug management.   Patient presents to the ED for concern of right little toe infection, this involves an extensive number of  treatment options, and is a complaint that carries with it a high risk of complications and morbidity.  The differential diagnosis includes abscess, cellulitis, osteomyelitis   Co morbidities that complicate the patient evaluation  Medication noncompliance from not being able to afford medications Uncontrolled T2DM, HTN   Additional history obtained:  Additional history obtained from Nursing and Outside Medical Records   External records from outside source obtained and reviewed including triage note, endocrine note from today, family medicine note from 01/29/2024   Lab Tests:  I Ordered, and personally interpreted labs.  The pertinent results include:   hCG negative CBG 3-3 Anion gap 12 Creatinine 1.01 No leukocytosis   Imaging Studies ordered:  I ordered imaging studies including  right foot x-ray, right foot MRI I independently visualized and interpreted imaging which showed *** I agree with the radiologist interpretation    Medicines ordered and prescription drug management:  I ordered medication including lisinopril , NS for HTN, tachycardia Reevaluation of the patient after these medicines showed that the patient improved I have reviewed the patients home medicines and have made adjustments as needed   Test Considered:  ***   Critical Interventions:  ***   Consultations Obtained:  I requested consultation with the ***,  and discussed lab and imaging findings as well as pertinent plan - they recommend: ***   Problem List / ED Course:  Right little toe infection No leukocytosis nor fever.  Is mildly tachycardic 100-110 bpm so will provide NS MRI pending to rule out osteomyelitis HTN Noncompliant with blood pressure medication.  Was previously on lisinopril  10 mg daily however reports that this takes her blood pressure.  PCP at recent visit on 01/29/2024 recommends lisinopril  5 mg for blood pressure control Today BP increased to 172/103.  No signs of EOD.   Creatinine WNL.  No complaints of headache, chest pain, visual disturbances Provided lisinopril  5mg  as prescribed Hyperglycemia CBG 303 No anion gap. Low suspicion for DKA Provided IVF   Reevaluation:  After the interventions noted above, I reevaluated the patient and found that they have :{resolved/improved/worsened:23923::improved}   Social Determinants of Health:  ***   Dispostion:  After consideration of the diagnostic results and the patients response to treatment, I feel that the patent would benefit from ***.    {Document critical care time when appropriate  Document review of labs and clinical decision tools ie [id number], etc  Document your independent review of radiology images and any outside records  Document your discussion with family members, caretakers and with consultants  Document social determinants of health affecting pt's care  Document your decision making why or why not admission, treatments were needed:32947:::1}   Final diagnoses:  None    ED Discharge Orders     None

## 2024-03-11 NOTE — Progress Notes (Signed)
 03/11/2024, 1:59 PM  Endocrinology follow-up note  Subjective:    Patient ID: Anne Wright, female    DOB: 01/24/1976.  Anne Wright is here for follow-up after she was seen in consultation for management of currently uncontrolled symptomatic diabetes prior to November 2023.    Original consult was requested by [name], [name], FNP.   Past Medical History:  Diagnosis Date   Diabetes mellitus without complication (HCC)    High cholesterol    Hypertension     History reviewed. No pertinent surgical history.  Social History   Socioeconomic History   Marital status: Single    Spouse name: Not on file   Number of children: 2   Years of education: Not on file   Highest education level: Not on file  Occupational History   Not on file  Tobacco Use   Smoking status: Former    Current packs/day: 0.00    Types: Cigarettes    Quit date: 06/29/2018    Years since quitting: 5.7    Passive exposure: Past   Smokeless tobacco: Never   Tobacco comments:    She smoked 10 cigarettes a day for 15 years , quit 2019.   Vaping Use   Vaping status: Never Used  Substance and Sexual Activity   Alcohol use: Yes    Comment: occa   Drug use: No   Sexual activity: Not Currently  Other Topics Concern   Not on file  Social History Narrative   Lives with her son.    Social Drivers of Corporate investment banker Strain: Not on file  Food Insecurity: Not on file  Transportation Needs: Not on file  Physical Activity: Not on file  Stress: Not on file  Social Connections: Not on file    Family History  Problem Relation Age of Onset   Diabetes Mother    Hypertension Mother    Asthma Brother    Breast cancer Maternal Grandmother    Asthma Son    Colon cancer Neg Hx    Cancer - Lung Neg Hx    Cervical cancer Neg Hx     Outpatient Encounter Medications as of 03/11/2024  Medication Sig   Blood  Glucose Monitoring Suppl (ACCU-CHEK GUIDE ME) w/Device KIT 1 Piece by Does not apply route as directed.   Continuous Glucose Receiver (FREESTYLE LIBRE 3 READER) DEVI 1 Piece by Does not apply route once as needed for up to 1 dose.   Continuous Glucose Sensor (FREESTYLE LIBRE 3 PLUS SENSOR) MISC Change sensor every 15 days.   glucose blood (ACCU-CHEK GUIDE TEST) test strip Use to monitor glucose 4 times daily  as instructed   aspirin  EC 81 MG tablet Take 81 mg by mouth daily.   atorvastatin  (LIPITOR) 20 MG tablet Take 1 tablet (20 mg total) by mouth daily.   gabapentin  (NEURONTIN ) 100 MG capsule Take 1 capsule (100 mg total) by mouth 3 (three) times daily.   glipiZIDE  (GLUCOTROL  XL) 5 MG 24 hr tablet Take 1 tablet (5 mg total) by mouth daily with breakfast.   insulin  glargine-yfgn (SEMGLEE ) 100 UNIT/ML Pen  Inject 20 Units into the skin at bedtime.   Insulin  Pen Needle (BD PEN NEEDLE NANO U/F) 32G X 4 MM MISC Use once daily   Lancets (FREESTYLE) lancets Test blood glucose four times daily. Use as instructed   lisinopril  (ZESTRIL ) 10 MG tablet Take 1 tablet (10 mg total) by mouth daily. (Patient not taking: Reported on 01/29/2024)   loperamide  (IMODIUM ) 2 MG capsule Take 1 capsule (2 mg total) by mouth 4 (four) times daily as needed for diarrhea or loose stools. (Patient not taking: Reported on 11/15/2023)   naproxen  (NAPROSYN ) 500 MG tablet Take 1 tablet (500 mg total) by mouth 2 (two) times daily. (Patient not taking: Reported on 11/15/2023)   omeprazole  (PRILOSEC) 40 MG capsule Take 1 capsule (40 mg total) by mouth in the morning and at bedtime.   ondansetron  (ZOFRAN -ODT) 4 MG disintegrating tablet Take 1 tablet (4 mg total) by mouth every 8 (eight) hours as needed for nausea or vomiting. (Patient not taking: Reported on 11/15/2023)   [DISCONTINUED] Blood Glucose Monitoring Suppl (FREESTYLE LITE) w/Device KIT Use to monitor glucose 4 times a day   [DISCONTINUED] Blood Glucose Monitoring Suppl DEVI 1  each by Does not apply route in the morning, at noon, and at bedtime. May substitute to any manufacturer covered by patient's insurance.   [DISCONTINUED] glucose blood (FREESTYLE LITE) test strip Use to monitor glucose 4 times a day as instructed   [DISCONTINUED] insulin  glargine (LANTUS  SOLOSTAR) 100 UNIT/ML Solostar Pen Inject 24 Units into the skin at bedtime.   [DISCONTINUED] insulin  glargine-yfgn (SEMGLEE ) 100 UNIT/ML Pen Inject 20 Units into the skin at bedtime. (Patient not taking: Reported on 03/11/2024)   [DISCONTINUED] Semaglutide ,0.25 or 0.5MG /DOS, (OZEMPIC , 0.25 OR 0.5 MG/DOSE,) 2 MG/3ML SOPN Inject 0.25 mg into the skin once a week. (Patient not taking: Reported on 03/11/2024)   No facility-administered encounter medications on file as of 03/11/2024.    ALLERGIES: Allergies  Allergen Reactions   Amoxicillin    Shellfish Allergy     VACCINATION STATUS: Immunization History  Administered Date(s) Administered   Hepatitis B, ADULT 08/06/2019, 09/11/2019   Influenza,inj,Quad PF,6+ Mos 04/21/2022   Influenza,inj,quad, With Preservative 08/01/2019   Influenza-Unspecified 04/29/2021   Moderna Sars-Covid-2 Vaccination 04/02/2020   PFIZER(Purple Top)SARS-COV-2 Vaccination 08/01/2020   PPD Test 04/08/2020, 11/22/2021   Tdap 04/10/2015, 08/01/2019    Diabetes She presents for her follow-up diabetic visit. She has type 2 diabetes mellitus. Onset time: She was diagnosed at approximate age of 32 years.  She did have a gestational diabetes during her first pregnancy 46 years ago. Her disease course has been worsening (Her diabetes treatment course is complicated by nonadherence for follow-up.  She missed her appointment since November 2023.). There are no hypoglycemic associated symptoms. Pertinent negatives for hypoglycemia include no confusion, headaches, pallor or seizures. Associated symptoms include foot ulcerations and weight loss. Pertinent negatives for diabetes include no blurred  vision, no chest pain, no fatigue, no polydipsia, no polyphagia and no polyuria. There are no hypoglycemic complications. Symptoms are worsening. Diabetic complications include peripheral neuropathy and PVD. (Nonadherence to medical follow-up and adherence to medications.) Risk factors for coronary artery disease include diabetes mellitus and dyslipidemia. Current diabetic treatments: She was supposed to be on basal insulin  andglipizide.  She is currently only on glipizide .  For unclear reasons, she failed to refill her supplies including insulin . Her weight is decreasing steadily. She is following a generally unhealthy diet. When asked about meal planning, she reported none. She has not had  a previous visit with a dietitian. She rarely participates in exercise. (She presents with no meter nor logs with her.  Her point-of-care A1c is 12.6% today, 10.3% during her last visit.  At least 1 time in the interval, her A1c was as high as 15%.   She denies any hypoglycemia.   ) An ACE inhibitor/angiotensin II receptor blocker is not being taken. Eye exam is not current.  Hyperlipidemia This is a chronic problem. The current episode started more than 1 year ago. The problem is uncontrolled. Exacerbating diseases include diabetes. Pertinent negatives include no chest pain, myalgias or shortness of breath. Current antihyperlipidemic treatment includes statins. Risk factors for coronary artery disease include diabetes mellitus, dyslipidemia and family history.    Review of Systems  Constitutional:  Positive for weight loss. Negative for chills, fatigue, fever and unexpected weight change.  HENT:  Negative for trouble swallowing and voice change.   Eyes:  Negative for blurred vision and visual disturbance.  Respiratory:  Negative for cough, shortness of breath and wheezing.   Cardiovascular:  Negative for chest pain, palpitations and leg swelling.  Gastrointestinal:  Negative for diarrhea, nausea and vomiting.   Endocrine: Negative for cold intolerance, heat intolerance, polydipsia, polyphagia and polyuria.  Musculoskeletal:  Negative for arthralgias and myalgias.  Skin:  Negative for color change, pallor, rash and wound.  Neurological:  Negative for seizures and headaches.  Psychiatric/Behavioral:  Negative for confusion and suicidal ideas.     Objective:       03/11/2024    1:27 PM 01/29/2024    1:48 PM 01/29/2024    1:41 PM  Vitals with BMI  Height 5' 2  5' 2  Weight 121 lbs 10 oz  121 lbs  BMI 22.24  22.13  Systolic 148 126 850  Diastolic 82 82 88  Pulse 92  99    BP (!) 148/82   Pulse 92   Ht 5' 2 (1.575 m)   Wt 121 lb 9.6 oz (55.2 kg)   BMI 22.24 kg/m   Wt Readings from Last 3 Encounters:  03/11/24 121 lb 9.6 oz (55.2 kg)  01/29/24 121 lb (54.9 kg)  11/15/23 125 lb 1.3 oz (56.7 kg)    On exam today: Right foot ulcer on lateral side including the little toe which looks gangrenous, will repeat suspicious for deep cellulitis/osteomyelitis    CMP ( most recent) CMP     Component Value Date/Time   NA 130 (L) 11/15/2023 1152   K 5.0 11/15/2023 1152   CL 96 11/15/2023 1152   CO2 20 11/15/2023 1152   GLUCOSE 503 (HH) 11/15/2023 1152   GLUCOSE 281 (H) 09/11/2023 2126   BUN 29 (H) 11/15/2023 1152   CREATININE 1.21 (H) 11/15/2023 1152   CREATININE 0.66 05/19/2020 0750   CALCIUM  9.3 11/15/2023 1152   PROT 6.5 11/15/2023 1152   ALBUMIN 4.0 11/15/2023 1152   AST 17 11/15/2023 1152   ALT 19 11/15/2023 1152   ALKPHOS 111 11/15/2023 1152   BILITOT 0.2 11/15/2023 1152   GFRNONAA 56 (L) 09/11/2023 2126   GFRNONAA 109 05/19/2020 0750   GFRAA 126 05/19/2020 0750     Diabetic Labs (most recent): Lab Results  Component Value Date   HGBA1C 12.6 (A) 03/11/2024   HGBA1C 15.0 (H) 11/15/2023   HGBA1C 9.7 (H) 09/22/2022   MICROALBUR 150 10/20/2020   Recent Results (from the past 2160 hours)  HgB A1c     Status: Abnormal   Collection Time: 03/11/24  1:46  PM  Result Value  Ref Range   Hemoglobin A1C     HbA1c POC (<> result, manual entry)     HbA1c, POC (prediabetic range)     HbA1c, POC (controlled diabetic range) 12.6 (A) 0.0 - 7.0 %    Lipid Panel     Component Value Date/Time   CHOL 153 09/22/2022 0900   TRIG 56 09/22/2022 0900   HDL 63 09/22/2022 0900   CHOLHDL 2.4 09/22/2022 0900   CHOLHDL 2.1 05/19/2020 0750   LDLCALC 78 09/22/2022 0900   LDLCALC 60 05/19/2020 0750   LABVLDL 12 09/22/2022 0900     Assessment & Plan:   1. Uncontrolled type 2 diabetes mellitus with hyperglycemia-complicated by diabetic foot ulcer  - Tammala Grauberger has currently uncontrolled symptomatic type 2 DM since 48 years of age. She presents with no meter nor logs with her.  Her point-of-care A1c is 12.6% today, 10.3% during her last visit.  At least 1 time in the interval, her A1c was as high as 15%.   She denies any hypoglycemia.    - Recent labs reviewed. - I had a long discussion with her about the progressive nature of diabetes and the pathology behind its complications. -She presents with a new/interval complication of diabetic foot ulcer suspicious for acute cellulitis/osteomyelitis putting her at exceedingly high risk for lower extremity application.   She is also at high risk for CAD, CVA, CKD, retinopathy, and neuropathy. These are all discussed in detail with her. -She states that she has an appointment coming up for a podiatrist, however, she is at risk of advancing cellulitis/osteomyelitis. -She was sent directly to emergency room with a note for better assessment and treatment of her diabetic foot ulcer.  - I have counseled her on diet  and weight management  by adopting a carbohydrate restricted/protein rich diet. Patient is encouraged to switch to  unprocessed or minimally processed     complex starch and increased protein intake (animal or plant source), fruits, and vegetables. -  she is advised to stick to a routine mealtimes to eat 3 meals  a day and  avoid unnecessary snacks ( to snack only to correct hypoglycemia).  - she acknowledges that there is a room for improvement in her food and drink choices. - Suggestion is made for her to avoid simple carbohydrates  from her diet including Cakes, Sweet Desserts, Ice Cream, Soda (diet and regular), Sweet Tea, Candies, Chips, Cookies, Store Bought Juices, Alcohol in Excess of  1-2 drinks a day, Artificial Sweeteners,  Coffee Creamer, and Sugar-free Products, Lemonade. This will help patient to have more stable blood glucose profile and potentially avoid unintended weight gain.  -  - I have discussed the need to tight control of glycemia, and prescribed her supplements including glucometer, freestyle libre CGM device.   - After her ER visit, she will resume home care of diabetes.  I refilled her Semglee  to start at 20 units nightly since it was monitoring her blood glucose at least 4 times a day-before meals and at bedtime.   She is also advised to continue glipizide  5 mg XL p.o. daily at breakfast.  - If she presents with significant postprandial hyperglycemia, she will be considered for prandial insulin  after her commitment for safe utility of insulin  is assured.  -She presents with significant unintended weight loss in the interval, not a candidate for Ozempic .    - she is encouraged to call clinic for blood glucose levels less than 70  or above 200 mg /dl. - She will greatly benefit from a CGM.  I discussed and prescribed the freestyle libre device for her.  - Specific targets for  A1c;  LDL, HDL,  and Triglycerides were discussed with the patient.  2) Blood Pressure /Hypertension:  -Her blood pressure is not controlled to target. -She is advised to continue lisinopril  20 mg p.o. daily  at breakfast.  She also has urine microalbuminuria.     3) Lipids/Hyperlipidemia:   Review of her recent lipid panel showed controlled lipid panel with LDL at 78.    She is advised to continue atorvastatin   20 mg p.o. nightly.    Side effects and precautions discussed with her.     4)  Weight/Diet:  Body mass index is 22.24 kg/m.  She is not a candidate for weight loss.  Hence, she is not a candidate for GLP-1 receptor agonist. 5) Chronic Care/Health Maintenance:  -she  is on Statin medications and  is encouraged to initiate and continue to follow up with Ophthalmology, Dentist,  Podiatrist at least yearly or according to recommendations, and advised to   stay away from smoking. I have recommended yearly flu vaccine and pneumonia vaccine at least every 5 years; moderate intensity exercise for up to 150 minutes weekly; and  sleep for at least 7 hours a day.  - she is  advised to maintain close follow up with [name], [name], FNP for primary care needs, as well as her other providers for optimal and coordinated care.   I spent  42  minutes in the care of the patient today including review of labs from CMP, Lipids, Thyroid Function, Hematology (current and previous including abstractions from other facilities); face-to-face time discussing  her blood glucose readings/logs, discussing hypoglycemia and hyperglycemia episodes and symptoms, medications doses, her options of short and long term treatment based on the latest standards of care / guidelines;  discussion about incorporating lifestyle medicine;  and documenting the encounter. Risk reduction counseling performed per USPSTF guidelines to reduce ardiovascular risk factors.     Please refer to Patient Instructions for Blood Glucose Monitoring and Insulin /Medications Dosing Guide  in media tab for additional information. Please  also refer to  Patient Self Inventory in the Media  tab for reviewed elements of pertinent patient history.  Matty Edgecombe participated in the discussions, expressed understanding, and voiced agreement with the above plans.  All questions were answered to her satisfaction. she is encouraged to contact clinic should she have  any questions or concerns prior to her return visit.      Follow up plan: - Return in about 10 days (around 03/21/2024) for F/U with Meter/CGM /Logs Only - no Labs.  Ranny Earl, MD [facility] [address] [place], [place] [address] Phone: [phone]  Fax: [phone]    03/11/2024, 1:59 PM  This note was partially dictated with voice recognition software. Similar sounding words can be transcribed inadequately or may not  be corrected upon review.

## 2024-03-11 NOTE — Telephone Encounter (Signed)
 Copied from CRM #[id number]. Topic: Clinical - Medication Refill >> Mar 11, 2024 11:35 AM Edsel HERO wrote: Medication:  insulin  glargine-yfgn (SEMGLEE ) 100 UNIT/ML Pen Insulin  Pen Needle (BD PEN NEEDLE NANO U/F) 32G X 4 MM MISC  Has the patient contacted their pharmacy? No  This is the patient's preferred pharmacy:  [id number] - [place], [place] - [address] AT [facility] [address]  [place] [address] Phone: [phone] Fax: [phone]  Is this the correct pharmacy for this prescription? Yes If no, delete pharmacy and type the correct one.   Has the prescription been filled recently? Yes  Is the patient out of the medication? Yes  Has the patient been seen for an appointment in the last year OR does the patient have an upcoming appointment? Yes  Can we respond through MyChart? Yes

## 2024-03-11 NOTE — Patient Instructions (Signed)

## 2024-03-12 DIAGNOSIS — E11628 Type 2 diabetes mellitus with other skin complications: Secondary | ICD-10-CM

## 2024-03-12 DIAGNOSIS — I10 Essential (primary) hypertension: Secondary | ICD-10-CM | POA: Insufficient documentation

## 2024-03-12 DIAGNOSIS — E785 Hyperlipidemia, unspecified: Secondary | ICD-10-CM | POA: Insufficient documentation

## 2024-03-12 DIAGNOSIS — L089 Local infection of the skin and subcutaneous tissue, unspecified: Principal | ICD-10-CM | POA: Diagnosis present

## 2024-03-12 DIAGNOSIS — E1142 Type 2 diabetes mellitus with diabetic polyneuropathy: Secondary | ICD-10-CM | POA: Insufficient documentation

## 2024-03-12 LAB — BASIC METABOLIC PANEL WITH GFR
Anion gap: 12 (ref 5–15)
BUN: 23 mg/dL — ABNORMAL HIGH (ref 6–20)
CO2: 21 mmol/L — ABNORMAL LOW (ref 22–32)
Calcium: 8.2 mg/dL — ABNORMAL LOW (ref 8.9–10.3)
Chloride: 103 mmol/L (ref 98–111)
Creatinine, Ser: 0.98 mg/dL (ref 0.44–1.00)
GFR, Estimated: >60 mL/min (ref >60)
Glucose, Bld: 342 mg/dL — ABNORMAL HIGH (ref 70–99)
Potassium: 4.1 mmol/L (ref 3.5–5.1)
Sodium: 136 mmol/L (ref 135–145)

## 2024-03-12 LAB — HIV ANTIBODY (ROUTINE TESTING W REFLEX): HIV Screen 4th Generation wRfx: NONREACTIVE

## 2024-03-12 LAB — CBG MONITORING, ED
Glucose-Capillary: 337 mg/dL — ABNORMAL HIGH (ref 70–99)
Glucose-Capillary: 417 mg/dL — ABNORMAL HIGH (ref 70–99)

## 2024-03-12 LAB — CBC
HCT: 34.3 % — ABNORMAL LOW (ref 36.0–46.0)
Hemoglobin: 11.1 g/dL — ABNORMAL LOW (ref 12.0–15.0)
MCH: 30.7 pg (ref 26.0–34.0)
MCHC: 32.4 g/dL (ref 30.0–36.0)
MCV: 94.8 fL (ref 80.0–100.0)
Platelets: 296 K/uL (ref 150–400)
RBC: 3.62 MIL/uL — ABNORMAL LOW (ref 3.87–5.11)
RDW: 13.1 % (ref 11.5–15.5)
WBC: 8.2 K/uL (ref 4.0–10.5)
nRBC: 0 % (ref 0.0–0.2)

## 2024-03-12 LAB — GLUCOSE, CAPILLARY
Glucose-Capillary: 264 mg/dL — ABNORMAL HIGH (ref 70–99)
Glucose-Capillary: 44 mg/dL — CL (ref 70–99)
Glucose-Capillary: 96 mg/dL (ref 70–99)
Glucose-Capillary: 157 mg/dL — ABNORMAL HIGH (ref 70–99)
Glucose-Capillary: 438 mg/dL — ABNORMAL HIGH (ref 70–99)
Glucose-Capillary: 475 mg/dL — ABNORMAL HIGH (ref 70–99)

## 2024-03-12 LAB — LACTIC ACID, PLASMA: Lactic Acid, Venous: 0.7 mmol/L (ref 0.5–1.9)

## 2024-03-12 MED ORDER — ACETAMINOPHEN 650 MG RE SUPP: 650.0000 mg | Freq: Four times a day (QID) | RECTAL | Status: DC | PRN

## 2024-03-12 MED ORDER — ONDANSETRON HCL 4 MG/2ML IJ SOLN
4.0000 mg | Freq: Four times a day (QID) | INTRAMUSCULAR | Status: DC | PRN
  Filled 2024-03-12: qty 2

## 2024-03-12 MED ORDER — SODIUM CHLORIDE 0.9 % IV SOLN: INTRAVENOUS | Status: AC

## 2024-03-12 MED ORDER — MAGNESIUM HYDROXIDE 400 MG/5ML PO SUSP: 30.0000 mL | Freq: Every day | ORAL | Status: DC | PRN

## 2024-03-12 MED ORDER — ONDANSETRON HCL 4 MG PO TABS
4.0000 mg | ORAL_TABLET | Freq: Four times a day (QID) | ORAL | Status: DC | PRN
  Administered 2024-03-16: 4 mg via ORAL
  Filled 2024-03-12: qty 1

## 2024-03-12 MED ORDER — VANCOMYCIN HCL IN DEXTROSE 1-5 GM/200ML-% IV SOLN
1000.0000 mg | INTRAVENOUS | Status: DC
  Administered 2024-03-13 – 2024-03-15 (×3): 1000 mg via INTRAVENOUS
  Filled 2024-03-12 (×3): qty 200

## 2024-03-12 MED ORDER — ACETAMINOPHEN 325 MG PO TABS
650.0000 mg | ORAL_TABLET | Freq: Four times a day (QID) | ORAL | Status: DC | PRN
  Administered 2024-03-12 (×2): 650 mg via ORAL
  Filled 2024-03-12: qty 2

## 2024-03-12 MED ORDER — VANCOMYCIN HCL IN DEXTROSE 1-5 GM/200ML-% IV SOLN
1000.0000 mg | Freq: Once | INTRAVENOUS | Status: AC
  Administered 2024-03-12 (×2): 1000 mg via INTRAVENOUS
  Filled 2024-03-12: qty 200

## 2024-03-12 MED ORDER — METRONIDAZOLE 500 MG/100ML IV SOLN
500.0000 mg | Freq: Once | INTRAVENOUS | Status: AC
  Administered 2024-03-12 (×2): 500 mg via INTRAVENOUS
  Filled 2024-03-12: qty 100

## 2024-03-12 MED ORDER — INSULIN GLARGINE-YFGN 100 UNIT/ML ~~LOC~~ SOLN
11.0000 [IU] | Freq: Two times a day (BID) | SUBCUTANEOUS | Status: DC
  Administered 2024-03-12 – 2024-03-13 (×4): 11 [IU] via SUBCUTANEOUS
  Filled 2024-03-12 (×7): qty 0.11

## 2024-03-12 MED ORDER — DIPHENHYDRAMINE HCL 25 MG PO CAPS
25.0000 mg | ORAL_CAPSULE | Freq: Four times a day (QID) | ORAL | Status: DC | PRN
  Administered 2024-03-12 – 2024-03-13 (×4): 25 mg via ORAL
  Filled 2024-03-12 (×2): qty 1

## 2024-03-12 MED ORDER — INSULIN ASPART 100 UNIT/ML IJ SOLN
0.0000 [IU] | Freq: Three times a day (TID) | INTRAMUSCULAR | Status: DC
  Administered 2024-03-12 (×4): 8 [IU] via SUBCUTANEOUS

## 2024-03-12 MED ORDER — SODIUM CHLORIDE 0.9 % IV SOLN
2.0000 g | Freq: Two times a day (BID) | INTRAVENOUS | Status: DC
  Administered 2024-03-12 – 2024-03-15 (×9): 2 g via INTRAVENOUS
  Filled 2024-03-12 (×6): qty 12.5

## 2024-03-12 MED ORDER — SODIUM CHLORIDE 0.9 % IV SOLN
2.0000 g | Freq: Once | INTRAVENOUS | Status: AC
  Administered 2024-03-12 (×2): 2 g via INTRAVENOUS
  Filled 2024-03-12: qty 20

## 2024-03-12 MED ORDER — TRAZODONE HCL 50 MG PO TABS: 25.0000 mg | ORAL_TABLET | Freq: Every evening | ORAL | Status: DC | PRN

## 2024-03-12 MED ORDER — ENOXAPARIN SODIUM 40 MG/0.4ML IJ SOSY
40.0000 mg | PREFILLED_SYRINGE | INTRAMUSCULAR | Status: DC
  Administered 2024-03-12 – 2024-03-15 (×6): 40 mg via SUBCUTANEOUS
  Filled 2024-03-12 (×4): qty 0.4

## 2024-03-12 MED ORDER — SODIUM CHLORIDE 0.9 % IV SOLN
2.0000 g | Freq: Once | INTRAVENOUS | Status: AC
  Administered 2024-03-12 (×2): 2 g via INTRAVENOUS
  Filled 2024-03-12: qty 12.5

## 2024-03-12 MED ORDER — INSULIN ASPART 100 UNIT/ML IJ SOLN: 0.0000 [IU] | Freq: Every day | INTRAMUSCULAR | Status: DC

## 2024-03-12 MED ORDER — VANCOMYCIN HCL IN DEXTROSE 1-5 GM/200ML-% IV SOLN: 1000.0000 mg | Freq: Once | INTRAVENOUS | Status: DC

## 2024-03-12 NOTE — Progress Notes (Signed)
   03/12/24 1043  TOC Brief Assessment  Insurance and Status Reviewed  Patient has primary care physician Yes  Home environment has been reviewed Single family home  Prior level of function: Independent  Prior/Current Home Services No current home services  Social Drivers of Health Review SDOH reviewed no interventions necessary  Readmission risk has been reviewed Yes  Transition of care needs no transition of care needs at this time

## 2024-03-12 NOTE — Progress Notes (Signed)
 Pharmacy Antibiotic Note  Shani Fitch is a 48 y.o. female admitted on 03/11/2024 with diabetic foot wound.  Pharmacy has been consulted for Vancomycin  dosing. WBC WNL. Renal function ok.   Plan: Vancomycin  1000 mg IV q24h >>>Estimated AUC: 505 Cefepime  2g IV q12h Trend WBC, temp, renal function  F/U infectious work-up Drug levels as indicated   Height: 5' 2 (157.5 cm) Weight: 54.9 kg (121 lb) IBW/kg (Calculated) : 50.1  Temp (24hrs), Avg:98.1 F (36.7 C), Min:97.9 F (36.6 C), Max:98.6 F (37 C)  Recent Labs  Lab 03/11/24 1622 03/12/24 0137  WBC 8.8  --   CREATININE 1.01*  --   LATICACIDVEN  --  0.7    Estimated Creatinine Clearance: 55 mL/min (A) (by C-G formula based on SCr of 1.01 mg/dL (H)).    Allergies  Allergen Reactions   Amoxicillin    Shellfish Allergy     Lynwood Mckusick, PharmD, BCPS Clinical Pharmacist Phone: [phone]

## 2024-03-12 NOTE — Assessment & Plan Note (Addendum)
-   This is associated with right distal lateral foot and fifth toe cellulitis. - The patient will be admitted to the medical-surgical bed. - Will continue antibiotic therapy with IV cefepime  and vancomycin . - Warm compresses will be utilized. - Podiatry consult can be obtained on an outpatient basis after management here - I discussed that with Dr. Malvin .

## 2024-03-12 NOTE — Progress Notes (Signed)
 Blood glucose 157 at 1347. Patient had her dinner tray in her room this evening and said she was not going to eat because she didn't want anything. She refused her evening dose of insulin .

## 2024-03-12 NOTE — Progress Notes (Signed)
 Blood glucose was 44 at 1344, had not eaten today.  Gave soft drink and peanut butter crackers and is now eating food husband brought.  Recheck at 1432 glucose had risen to 96 but so far this reading has not transferred to epic.  Patient showered when she first arrived to room per ok with MD.

## 2024-03-12 NOTE — Assessment & Plan Note (Addendum)
-   The patient will be placed on supplemental coverage with NovoLog . - Will continue basal coverage. - Will continue Glucotrol  XL. - Will continue Neurontin .

## 2024-03-12 NOTE — Assessment & Plan Note (Signed)
 Will continue statin therapy

## 2024-03-12 NOTE — Plan of Care (Signed)
 Consulted for R foot infection at lateral forefoot, possible OM 5th toe  Reviewed MRI - there is concern for possible OM at the 5th toe IPJ and given clinical picture this would correlate with area of ulceration plantar 5th toe.   I recommend transfer to [facility] hospital for full consult with plan for likely 5th toe amputation. Also recommend ABI / PVR vascular studies. Discussed with Dr. Luz. He talked with patient and she does not want amputation at this time and will stay at [facility].   If patient changes her mind and is agreeable to amputation will remain available for consult at [facility] if patient is transferred. Will sign off.        [name], DPM [facility] / [facility]                   03/12/2024

## 2024-03-12 NOTE — ED Notes (Signed)
 Called into patient's room. Patient reports itching and hives that started with the second abx. Medication paused. Admitting MD made aware

## 2024-03-12 NOTE — H&P (Addendum)
 [place]   PATIENT NAME: Anne Wright    MR#:  [id number]  DATE OF BIRTH:  Oct 09, 1975  DATE OF ADMISSION:  03/11/2024  PRIMARY CARE PHYSICIAN: [name], [name], FNP   Patient is coming from: Home  REQUESTING/REFERRING PHYSICIAN: Euel Tinnie BRAVO, PA-C  CHIEF COMPLAINT:   Chief Complaint  Patient presents with   Foot Pain    Infection in right pinky toe    HISTORY OF PRESENT ILLNESS:  Anne Wright is a 48 y.o. female with medical history significant for type 2 diabetes mellitus, hypertension and dyslipidemia, who presented to the emergency room with acute onset of worsening right lateral foot and small to swelling with erythema, tenderness, warmth and induration with pain, over the last 3 weeks.  She has seen yellowish discharge.  She admits to elevated blood glucose levels.  She has not been taking her insulin  though due to insurance issues.  No fever or chills.  No nausea or vomiting or abdominal pain.  No chest pain or palpitations.  No cough or wheezing or dyspnea.  No dysuria, oliguria or hematuria or flank pain. Her ED Course: When the patient came to the ER, BP was 148/82 and later 177/96 with otherwise normal vital signs.  Labs revealed mild hyponatremia 133 and a CO2 of 21 with blood glucose of 247, BUN of 24 and otherwise unremarkable CMP.  CBC was normal.  Serum pregnancy test was negative.  2 blood cultures were drawn. EKG as reviewed by me : None Imaging: Right foot x-ray showed the following: Soft tissue swelling lateral to the fifth metatarsophalangeal joint. No evidence for foreign body or acute osseous abnormality.  The patient was given 1 L bolus of IV lactated ringer , 5 mg of p.o. Vistaril and IV vancomycin  and Rocephin  as well as Flagyl .   PAST MEDICAL HISTORY:   Past Medical History:  Diagnosis Date   Diabetes mellitus without complication (HCC)    High cholesterol    Hypertension     PAST SURGICAL HISTORY:  History reviewed. No pertinent  surgical history.  SOCIAL HISTORY:   Social History   Tobacco Use   Smoking status: Former    Current packs/day: 0.00    Types: Cigarettes    Quit date: 06/29/2018    Years since quitting: 5.7    Passive exposure: Past   Smokeless tobacco: Never   Tobacco comments:    She smoked 10 cigarettes a day for 15 years , quit 2019.   Substance Use Topics   Alcohol use: Yes    Comment: occa    FAMILY HISTORY:   Family History  Problem Relation Age of Onset   Diabetes Mother    Hypertension Mother    Asthma Brother    Breast cancer Maternal Grandmother    Asthma Son    Colon cancer Neg Hx    Cancer - Lung Neg Hx    Cervical cancer Neg Hx     DRUG ALLERGIES:   Allergies  Allergen Reactions   Amoxicillin    Shellfish Allergy     REVIEW OF SYSTEMS:   ROS As per history of present illness. All pertinent systems were reviewed above. Constitutional, HEENT, cardiovascular, respiratory, GI, GU, musculoskeletal, neuro, psychiatric, endocrine, integumentary and hematologic systems were reviewed and are otherwise negative/unremarkable except for positive findings mentioned above in the HPI.   MEDICATIONS AT HOME:   Prior to Admission medications   Medication Sig Start Date End Date Taking? Authorizing Provider  aspirin  EC  81 MG tablet Take 81 mg by mouth daily.    [provider]  atorvastatin  (LIPITOR) 20 MG tablet Take 1 tablet (20 mg total) by mouth daily. 11/15/23   Bevely Doffing, FNP  Blood Glucose Monitoring Suppl (ACCU-CHEK GUIDE ME) w/Device KIT 1 Piece by Does not apply route as directed. 03/11/24   Lenis Ethelle ORN, MD  Continuous Glucose Receiver (FREESTYLE LIBRE 3 READER) DEVI 1 Piece by Does not apply route once as needed for up to 1 dose. 03/11/24   Nida, Gebreselassie W, MD  Continuous Glucose Sensor (FREESTYLE LIBRE 3 PLUS SENSOR) MISC Change sensor every 15 days. 03/11/24   Nida, Gebreselassie W, MD  gabapentin  (NEURONTIN ) 100 MG capsule Take 1 capsule  (100 mg total) by mouth 3 (three) times daily. 01/29/24   [name], [name], FNP  glipiZIDE  (GLUCOTROL  XL) 5 MG 24 hr tablet Take 1 tablet (5 mg total) by mouth daily with breakfast. 11/15/23   Bevely Doffing, FNP  glucose blood (ACCU-CHEK GUIDE TEST) test strip Use to monitor glucose 4 times daily  as instructed 03/11/24   Nida, Gebreselassie W, MD  insulin  glargine-yfgn (SEMGLEE ) 100 UNIT/ML Pen Inject 20 Units into the skin at bedtime. 03/11/24   Nida, Gebreselassie W, MD  Insulin  Pen Needle (BD PEN NEEDLE NANO U/F) 32G X 4 MM MISC Use once daily 03/11/24   [name], [name], FNP  Lancets (FREESTYLE) lancets Test blood glucose four times daily. Use as instructed 11/15/23   Bevely Doffing, FNP  lisinopril  (ZESTRIL ) 10 MG tablet Take 1 tablet (10 mg total) by mouth daily. Patient not taking: Reported on 01/29/2024 11/15/23   Bevely Doffing, FNP  loperamide  (IMODIUM ) 2 MG capsule Take 1 capsule (2 mg total) by mouth 4 (four) times daily as needed for diarrhea or loose stools. Patient not taking: Reported on 11/15/2023 09/10/23   Theadore Ozell HERO, MD  naproxen  (NAPROSYN ) 500 MG tablet Take 1 tablet (500 mg total) by mouth 2 (two) times daily. Patient not taking: Reported on 11/15/2023 09/11/23   Minnie Tinnie BRAVO, PA  omeprazole  (PRILOSEC) 40 MG capsule Take 1 capsule (40 mg total) by mouth in the morning and at bedtime. 09/07/22 09/07/23  Cindie Carlin POUR, DO  ondansetron  (ZOFRAN -ODT) 4 MG disintegrating tablet Take 1 tablet (4 mg total) by mouth every 8 (eight) hours as needed for nausea or vomiting. Patient not taking: Reported on 11/15/2023 09/11/23   Minnie Tinnie BRAVO, PA  insulin  glargine (LANTUS  SOLOSTAR) 100 UNIT/ML Solostar Pen Inject 24 Units into the skin at bedtime. 06/30/21 05/11/22  Lenis Ethelle ORN, MD      VITAL SIGNS:  Blood pressure 107/87, pulse 98, temperature 97.9 F (36.6 C), temperature source Oral, resp. rate 15, height 5' 2 (1.575 m), weight 54.9 kg, last menstrual period 02/18/2024, SpO2  100%.  PHYSICAL EXAMINATION:  Physical Exam  GENERAL:  48 y.o.-year-old patient lying in the bed with no acute distress.  EYES: Pupils equal, round, reactive to light and accommodation. No scleral icterus. Extraocular muscles intact.  HEENT: Head atraumatic, normocephalic. Oropharynx and nasopharynx clear.  NECK:  Supple, no jugular venous distention. No thyroid enlargement, no tenderness.  LUNGS: Normal breath sounds bilaterally, no wheezing, rales,rhonchi or crepitation. No use of accessory muscles of respiration.  CARDIOVASCULAR: Regular rate and rhythm, S1, S2 normal. No murmurs, rubs, or gallops.  ABDOMEN: Soft, nondistended, nontender. Bowel sounds present. No organomegaly or mass.  EXTREMITIES: No pedal edema, cyanosis, or clubbing.  NEUROLOGIC: Cranial nerves II through XII are intact. Muscle strength  5/5 in all extremities. Sensation intact. Gait not checked.  PSYCHIATRIC: The patient is alert and oriented x 3.  Normal affect and good eye contact. SKIN: Right distal lateral and fifth toe swelling with erythema, induration, warmth, tenderness without fluctuation or discharge.      LABORATORY PANEL:   CBC Recent Labs  Lab 03/12/24 0139  WBC 8.2  HGB 11.1*  HCT 34.3*  PLT 296   ------------------------------------------------------------------------------------------------------------------  Chemistries  Recent Labs  Lab 03/11/24 1622 03/12/24 0139  NA 133* 136  K 4.9 4.1  CL 100 103  CO2 21* 21*  GLUCOSE 247* 342*  BUN 24* 23*  CREATININE 1.01* 0.98  CALCIUM  9.2 8.2*  AST 29  --   ALT 24  --   ALKPHOS 77  --   BILITOT 0.7  --    ------------------------------------------------------------------------------------------------------------------  Cardiac Enzymes No results for input(s): TROPONINI in the last 168 hours. ------------------------------------------------------------------------------------------------------------------  RADIOLOGY:  DG Foot  Complete Right Result Date: 03/11/2024 CLINICAL DATA:  Possible infected ulceration. EXAM: RIGHT FOOT COMPLETE - 3+ VIEW COMPARISON:  None Available. FINDINGS: There is no evidence of fracture or dislocation. There is no evidence of arthropathy or other focal bone abnormality. There is soft tissue swelling lateral to the fifth metatarsophalangeal joint. No evidence for foreign body. IMPRESSION: Soft tissue swelling lateral to the fifth metatarsophalangeal joint. No evidence for foreign body or acute osseous abnormality. Electronically Signed   By: Greig Pique M.D.   On: 03/11/2024 17:22      IMPRESSION AND PLAN:  Assessment and Plan: * Diabetic foot infection (HCC) - This is associated with right distal lateral foot and fifth toe cellulitis. - The patient will be admitted to the medical-surgical bed. - Will continue antibiotic therapy with IV cefepime  and vancomycin . - Warm compresses will be utilized. - Podiatry consult can be obtained on an outpatient basis after management here - I discussed that with Dr. Malvin .  Dyslipidemia - Will continue statin therapy.  Essential hypertension - Will continue antihypertensive therapy.  Type 2 diabetes mellitus with peripheral neuropathy (HCC) - The patient will be placed on supplemental coverage with NovoLog . - Will continue basal coverage. - Will continue Glucotrol  XL. - Will continue Neurontin .   DVT prophylaxis: Lovenox .  Advanced Care Planning:  Code Status: full code.  Family Communication:  The plan of care was discussed in details with the patient (and family). I answered all questions. The patient agreed to proceed with the above mentioned plan. Further management will depend upon hospital course. Disposition Plan: Back to previous home environment Consults called: none.  All the records are reviewed and case discussed with ED provider.  Status is: Inpatient  At the time of the admission, it appears that the appropriate  admission status for this patient is inpatient.  This is judged to be reasonable and necessary in order to provide the required intensity of service to ensure the patient's safety given the presenting symptoms, physical exam findings and initial radiographic and laboratory data in the context of comorbid conditions.  The patient requires inpatient status due to high intensity of service, high risk of further deterioration and high frequency of surveillance required.  I certify that at the time of admission, it is my clinical judgment that the patient will require inpatient hospital care extending more than 2 midnights.                            Dispo: The  patient is from: Home              Anticipated d/c is to: Home              Patient currently is not medically stable to d/c.              Difficult to place patient: No  Madison DELENA Peaches M.D on 03/12/2024 at 6:14 AM  [facility]   From 7 PM-7 AM, contact night-coverage [email or web address]  CC: Primary care physician; [name], [name], FNP

## 2024-03-12 NOTE — Assessment & Plan Note (Signed)
-   Will continue antihypertensive therapy.

## 2024-03-12 NOTE — ED Notes (Signed)
 Per Dr Lawence : D/C flagyl  infusion; wait approx 1 hour before administering vancomycin  infusion; and V.O. given for Benadryl  25 mg p.o. 4 times daily as needed

## 2024-03-13 ENCOUNTER — Inpatient Hospital Stay (HOSPITAL_COMMUNITY): Payer: Self-pay

## 2024-03-13 LAB — COMPREHENSIVE METABOLIC PANEL WITH GFR
ALT: 18 U/L (ref 0–44)
AST: 19 U/L (ref 15–41)
Albumin: 2.5 g/dL — ABNORMAL LOW (ref 3.5–5.0)
Alkaline Phosphatase: 80 U/L (ref 38–126)
Anion gap: 7 (ref 5–15)
BUN: 18 mg/dL (ref 6–20)
CO2: 22 mmol/L (ref 22–32)
Calcium: 8 mg/dL — ABNORMAL LOW (ref 8.9–10.3)
Chloride: 110 mmol/L (ref 98–111)
Creatinine, Ser: 0.93 mg/dL (ref 0.44–1.00)
GFR, Estimated: >60 mL/min (ref >60)
Glucose, Bld: 134 mg/dL — ABNORMAL HIGH (ref 70–99)
Potassium: 3.2 mmol/L — ABNORMAL LOW (ref 3.5–5.1)
Sodium: 139 mmol/L (ref 135–145)
Total Bilirubin: 0.2 mg/dL (ref 0.0–1.2)
Total Protein: 5.4 g/dL — ABNORMAL LOW (ref 6.5–8.1)

## 2024-03-13 LAB — LIPID PANEL
Cholesterol: 144 mg/dL (ref 0–200)
HDL: 52 mg/dL (ref >40)
LDL Cholesterol: 68 mg/dL (ref 0–99)
Total CHOL/HDL Ratio: 2.8 ratio
Triglycerides: 119 mg/dL (ref <150)
VLDL: 24 mg/dL (ref 0–40)

## 2024-03-13 LAB — C-REACTIVE PROTEIN: CRP: 0.9 mg/dL (ref <1.0)

## 2024-03-13 LAB — CBC
HCT: 31 % — ABNORMAL LOW (ref 36.0–46.0)
Hemoglobin: 10.5 g/dL — ABNORMAL LOW (ref 12.0–15.0)
MCH: 32.4 pg (ref 26.0–34.0)
MCHC: 33.9 g/dL (ref 30.0–36.0)
MCV: 95.7 fL (ref 80.0–100.0)
Platelets: 266 K/uL (ref 150–400)
RBC: 3.24 MIL/uL — ABNORMAL LOW (ref 3.87–5.11)
RDW: 13.2 % (ref 11.5–15.5)
WBC: 7 K/uL (ref 4.0–10.5)
nRBC: 0 % (ref 0.0–0.2)

## 2024-03-13 LAB — GLUCOSE, CAPILLARY
Glucose-Capillary: 106 mg/dL — ABNORMAL HIGH (ref 70–99)
Glucose-Capillary: 106 mg/dL — ABNORMAL HIGH (ref 70–99)
Glucose-Capillary: 125 mg/dL — ABNORMAL HIGH (ref 70–99)
Glucose-Capillary: 178 mg/dL — ABNORMAL HIGH (ref 70–99)
Glucose-Capillary: 188 mg/dL — ABNORMAL HIGH (ref 70–99)
Glucose-Capillary: 325 mg/dL — ABNORMAL HIGH (ref 70–99)
Glucose-Capillary: 330 mg/dL — ABNORMAL HIGH (ref 70–99)

## 2024-03-13 LAB — MAGNESIUM: Magnesium: 1.7 mg/dL (ref 1.7–2.4)

## 2024-03-13 LAB — GLUCOSE, RANDOM: Glucose, Bld: 538 mg/dL (ref 70–99)

## 2024-03-13 MED ORDER — SODIUM CHLORIDE 0.9 % IV BOLUS
1000.0000 mL | Freq: Once | INTRAVENOUS | Status: AC
  Administered 2024-03-13 (×2): 1000 mL via INTRAVENOUS

## 2024-03-13 MED ORDER — INSULIN ASPART 100 UNIT/ML IJ SOLN
0.0000 [IU] | INTRAMUSCULAR | Status: DC
  Administered 2024-03-13: 3 [IU] via SUBCUTANEOUS
  Administered 2024-03-13: 15 [IU] via SUBCUTANEOUS
  Administered 2024-03-13: 4 [IU] via SUBCUTANEOUS
  Administered 2024-03-13: 3 [IU] via SUBCUTANEOUS
  Administered 2024-03-13 (×2): 4 [IU] via SUBCUTANEOUS
  Administered 2024-03-13: 20 [IU] via SUBCUTANEOUS
  Administered 2024-03-13: 4 [IU] via SUBCUTANEOUS
  Administered 2024-03-13: 20 [IU] via SUBCUTANEOUS
  Administered 2024-03-13 – 2024-03-14 (×2): 15 [IU] via SUBCUTANEOUS

## 2024-03-13 MED ORDER — HYDRALAZINE HCL 20 MG/ML IJ SOLN
10.0000 mg | Freq: Four times a day (QID) | INTRAMUSCULAR | Status: DC | PRN
  Administered 2024-03-13 (×2): 10 mg via INTRAVENOUS
  Filled 2024-03-13: qty 1

## 2024-03-13 MED ORDER — MAGNESIUM OXIDE -MG SUPPLEMENT 400 (240 MG) MG PO TABS
400.0000 mg | ORAL_TABLET | Freq: Two times a day (BID) | ORAL | Status: DC
  Administered 2024-03-13 – 2024-03-15 (×7): 400 mg via ORAL
  Filled 2024-03-13 (×5): qty 1

## 2024-03-13 MED ORDER — SODIUM CHLORIDE 0.9 % IV SOLN: INTRAVENOUS | Status: AC

## 2024-03-13 MED ORDER — POTASSIUM CHLORIDE CRYS ER 20 MEQ PO TBCR
40.0000 meq | EXTENDED_RELEASE_TABLET | ORAL | Status: AC
  Administered 2024-03-13 (×4): 40 meq via ORAL
  Filled 2024-03-13 (×2): qty 2

## 2024-03-13 MED ORDER — ASPIRIN 81 MG PO TBEC
81.0000 mg | DELAYED_RELEASE_TABLET | Freq: Every day | ORAL | Status: DC
  Administered 2024-03-14 – 2024-03-16 (×2): 81 mg via ORAL
  Filled 2024-03-13 (×3): qty 1

## 2024-03-13 MED ORDER — ATORVASTATIN CALCIUM 10 MG PO TABS
20.0000 mg | ORAL_TABLET | Freq: Every day | ORAL | Status: DC
  Administered 2024-03-13 – 2024-03-16 (×5): 20 mg via ORAL
  Filled 2024-03-13: qty 1
  Filled 2024-03-13 (×4): qty 2

## 2024-03-13 MED ORDER — GABAPENTIN 300 MG PO CAPS
300.0000 mg | ORAL_CAPSULE | Freq: Two times a day (BID) | ORAL | Status: DC
  Administered 2024-03-13 – 2024-03-16 (×7): 300 mg via ORAL
  Filled 2024-03-13 (×7): qty 1

## 2024-03-13 MED ORDER — METRONIDAZOLE 500 MG/100ML IV SOLN
500.0000 mg | Freq: Two times a day (BID) | INTRAVENOUS | Status: DC
  Administered 2024-03-13 – 2024-03-15 (×7): 500 mg via INTRAVENOUS
  Filled 2024-03-13 (×5): qty 100

## 2024-03-13 NOTE — Inpatient Diabetes Management (Signed)
 Inpatient Diabetes Program Recommendations  AACE/ADA: New Consensus Statement on Inpatient Glycemic Control   Target Ranges:  Prepandial:   less than 140 mg/dL      Peak postprandial:   less than 180 mg/dL (1-2 hours)      Critically ill patients:  140 - 180 mg/dL    Latest Reference Range & Units 03/13/24 02:31 03/13/24 04:57 03/13/24 07:15  Glucose-Capillary 70 - 99 mg/dL 674 (H)  Novolog  15 units @2 :53 106 (H) 106 (H)    Latest Reference Range & Units 03/12/24 09:22 03/12/24 11:04 03/12/24 13:44 03/12/24 14:32 03/12/24 15:47 03/12/24 21:12 03/12/24 22:24  Glucose-Capillary 70 - 99 mg/dL 582 (H)  Novolog  8 units @9 :58  Semglee  11 units 264 (H)  Novolog  8 units @ 11:22 44 (LL) 96 157 (H) 438 (H) 475 (H)  Novolog  20 units @00 :52   Review of Glycemic Control  Diabetes history: DM2 Outpatient Diabetes medications: Semglee  20 units at bedtime, Glipizide  XL 5 mg QAM Current orders for Inpatient glycemic control: Semglee  11 units BID, Novolog  0-20 units Q4H  Inpatient Diabetes Program Recommendations:    Insulin : CBG down to 44 mg/dl on 86:55 on 1/87 due to insulin  stacking (got Novolog  8 units at 9:58 and Novolog  8 units at 11:22). Semglee  was only given once on 8/12 (10:02 am). Please consider changing Semglee  to 15 units daily and decreasing Novolog  correction to 0-15 units Q4H.  Thanks, Earnie Gainer, RN, MSN, CDCES Diabetes Coordinator Inpatient Diabetes Program [phone] (Team Pager from 8am to 5pm)

## 2024-03-13 NOTE — Progress Notes (Signed)
 Pt requesting to be transferred to [facility] for further evaluation of R. Foot infection.

## 2024-03-13 NOTE — Progress Notes (Signed)
 PROGRESS NOTE  Anne Wright, is a 48 y.o. female, DOB - 01-07-1976, FMW:[id number]  Admit date - 03/11/2024   Admitting Physician Madison DELENA Peaches, MD  Outpatient Primary MD for the patient is [name], [name], FNP  LOS - 1  Chief Complaint  Patient presents with   Foot Pain    Infection in right pinky toe      Brief Narrative:  48 y.o. female with medical history significant for uncontrolled DM, hypertension and dyslipidemia admitted on 03/12/2024 with left foot diabetic infection in the setting of PAD and poor diabetic control - Being transferred to [facility] for further vascular surgery evaluation as requested by Dr. Norman Wright    -Assessment and Plan: 1) Diabetic foot infection (HCC) - This is associated with right distal lateral foot and fifth toe cellulitis. - Continue cefepime /Vanco/Flagyl  Discussed with podiatrist Dr. Marolyn Honour --- however at this time patient declines amputation as recommended by podiatrist -Patient agreeable to further vascular surgery evaluation and intervention to restore circulation--she would like to continue antibiotics after revascularization, rather than amputation Right diabetic foot infection. - 2)PAD--- arterial ABIs completed decreased RIGHT ankle brachial index indicative of moderate peripheral arterial disease - Discussed with vascular surgeon Dr. Norman Wright - Transferred to [facility] for further vascular surgery evaluation and possible angiogram - Aspirin  and Lipitor as ordered  3)HTN--BP stable without meds - May use IV hydralazine  as needed elevated BP  4)DM2-- with peripheral neuropathy  A1c 12.6, A1c was 15 about 3 months ago-reflecting uncontrolled DM with hyperglycemia PTA --At discharge please give [employer] brand insulin  70/30 for patient which is affordable -She apparently could Not afford her insulin  co-pay hence she went without insulin  and A1c went up -At discharge patient was on his metformin and other oral agents - Give  gabapentin  - Give Semglee  11 units Use Novolog /Humalog Sliding scale insulin  with Accu-Cheks/Fingersticks as ordered   Status is: Inpatient   Disposition: The patient is from: Home              Anticipated d/c is to: Home              Anticipated d/c date is: 2 days              Patient currently is not medically stable to d/c. Barriers: Not Clinically Stable-   Code Status :  -  Code Status: Full Code   Family Communication:    NA (patient is alert, awake and coherent)   DVT Prophylaxis  :   - SCDs  enoxaparin  (LOVENOX ) injection 40 mg Start: 03/12/24 1000   Lab Results  Component Value Date   PLT 266 03/13/2024   Inpatient Medications  Scheduled Meds:  enoxaparin  (LOVENOX ) injection  40 mg Subcutaneous Q24H   insulin  aspart  0-20 Units Subcutaneous Q4H   insulin  glargine-yfgn  11 Units Subcutaneous BID   magnesium  oxide  400 mg Oral BID   Continuous Infusions:  sodium chloride      ceFEPime  (MAXIPIME ) IV 2 g (03/13/24 1704)   metronidazole  500 mg (03/13/24 1051)   vancomycin  1,000 mg (03/13/24 0655)   PRN Meds:.acetaminophen  **OR** acetaminophen , diphenhydrAMINE , magnesium  hydroxide, ondansetron  **OR** ondansetron  (ZOFRAN ) IV, traZODone    Anti-infectives (From admission, onward)    Start     Dose/Rate Route Frequency Ordered Stop   03/13/24 1000  metroNIDAZOLE  (FLAGYL ) IVPB 500 mg        500 mg 100 mL/hr over 60 Minutes Intravenous Every 12 hours 03/13/24 0913     03/13/24 0600  vancomycin  (VANCOCIN ) IVPB 1000 mg/200 mL premix        1,000 mg 200 mL/hr over 60 Minutes Intravenous Every 24 hours 03/12/24 0452     03/12/24 1800  ceFEPIme  (MAXIPIME ) 2 g in sodium chloride  0.9 % 100 mL IVPB        2 g 200 mL/hr over 30 Minutes Intravenous Every 12 hours 03/12/24 0451     03/12/24 0500  ceFEPIme  (MAXIPIME ) 2 g in sodium chloride  0.9 % 100 mL IVPB        2 g 200 mL/hr over 30 Minutes Intravenous  Once 03/12/24 0429 03/12/24 0624   03/12/24 0430  vancomycin  (VANCOCIN )  IVPB 1000 mg/200 mL premix        1,000 mg 200 mL/hr over 60 Minutes Intravenous  Once 03/12/24 0429 03/12/24 0741   03/12/24 0115  cefTRIAXone  (ROCEPHIN ) 2 g in sodium chloride  0.9 % 100 mL IVPB       Placed in And Linked Group   2 g 200 mL/hr over 30 Minutes Intravenous Once 03/12/24 0111 03/12/24 0230   03/12/24 0115  metroNIDAZOLE  (FLAGYL ) IVPB 500 mg       Placed in And Linked Group   500 mg 100 mL/hr over 60 Minutes Intravenous  Once 03/12/24 0111 03/12/24 0310   03/12/24 0115  vancomycin  (VANCOCIN ) IVPB 1000 mg/200 mL premix  Status:  Discontinued       Placed in And Linked Group   1,000 mg 200 mL/hr over 60 Minutes Intravenous  Once 03/12/24 0111 03/12/24 0436       Subjective: Vollie Axon today has no fevers, no emesis,  No chest pain,    - Eating and drinking well   Objective: Vitals:   03/12/24 2011 03/12/24 2225 03/13/24 0453 03/13/24 1402  BP: 131/87 106/69 117/61 113/70  Pulse: 99 95 98 96  Resp: 18 18 18 20   Temp: 98 F (36.7 C) 98.2 F (36.8 C) 98 F (36.7 C) 98.6 F (37 C)  TempSrc: Oral Oral Oral Oral  SpO2: 100% 100% 100% 100%  Weight:      Height:        Intake/Output Summary (Last 24 hours) at 03/13/2024 1849 Last data filed at 03/13/2024 1500 Gross per 24 hour  Intake 1169.11 ml  Output --  Net 1169.11 ml   Filed Weights   03/11/24 1445  Weight: 54.9 kg    Physical Exam  Gen:- Awake Alert,  in no apparent distress  HEENT:- [place].AT, No sclera icterus Neck-Supple Neck,No JVD,.  Lungs-  CTAB , fair symmetrical air movement CV- S1, S2 normal, regular  Abd-  +ve B.Sounds, Abd Soft, No tenderness,    Extremity/Skin:- No  edema, diminished pulses on the right foot , right fifth toe area cellulitis please see photos in epic Psych-affect is appropriate, oriented x3 Neuro-no new focal deficits, no tremors  Data Reviewed: I have personally reviewed following labs and imaging studies  CBC: Recent Labs  Lab 03/11/24 1622  03/12/24 0139 03/13/24 0438  WBC 8.8 8.2 7.0  NEUTROABS 5.5  --   --   HGB 13.4 11.1* 10.5*  HCT 40.6 34.3* 31.0*  MCV 94.9 94.8 95.7  PLT 326 296 266   Basic Metabolic Panel: Recent Labs  Lab 03/11/24 1622 03/12/24 0139 03/12/24 2308 03/13/24 0438  NA 133* 136  --  139  K 4.9 4.1  --  3.2*  CL 100 103  --  110  CO2 21* 21*  --  22  GLUCOSE 247*  342* 538* 134*  BUN 24* 23*  --  18  CREATININE 1.01* 0.98  --  0.93  CALCIUM  9.2 8.2*  --  8.0*  MG  --   --   --  1.7   GFR: Estimated Creatinine Clearance: 59.8 mL/min (by C-G formula based on SCr of 0.93 mg/dL). Liver Function Tests: Recent Labs  Lab 03/11/24 1622 03/13/24 0438  AST 29 19  ALT 24 18  ALKPHOS 77 80  BILITOT 0.7 0.2  PROT 7.4 5.4*  ALBUMIN 3.7 2.5*   HbA1C: Recent Labs    03/11/24 1346  HGBA1C 12.6*   Recent Results (from the past 240 hours)  Blood Cultures x 2 sites     Status: None (Preliminary result)   Collection Time: 03/12/24  1:37 AM   Specimen: BLOOD  Result Value Ref Range Status   Specimen Description BLOOD BLOOD LEFT ARM  Final   Special Requests   Final    BOTTLES DRAWN AEROBIC AND ANAEROBIC Blood Culture adequate volume   Culture   Final    NO GROWTH 1 DAY Performed at [facility], [address] [place], [place] [address]    Report Status PENDING  Incomplete  Blood Cultures x 2 sites     Status: None (Preliminary result)   Collection Time: 03/12/24  1:39 AM   Specimen: BLOOD  Result Value Ref Range Status   Specimen Description BLOOD BLOOD LEFT HAND  Final   Special Requests   Final    BOTTLES DRAWN AEROBIC AND ANAEROBIC Blood Culture adequate volume   Culture   Final    NO GROWTH 1 DAY Performed at [facility], [address], [place], [place] [address]    Report Status PENDING  Incomplete    Radiology Studies: US  ARTERIAL ABI (SCREENING LOWER EXTREMITY) Result Date: 03/13/2024 CLINICAL DATA:  BILATERAL lower extremity pain. Hypertension. Diabetes. RIGHT fifth toe  ulcer. EXAM: NONINVASIVE PHYSIOLOGIC VASCULAR STUDY OF BILATERAL LOWER EXTREMITIES TECHNIQUE: Evaluation of both lower extremities were performed at rest, including calculation of ankle-brachial indices with single level pressure measurements and doppler recording. COMPARISON:  None available. FINDINGS: Right ABI:  0.64 Left ABI:  1.02 Right Lower Extremity: Posterior tibial and dorsalis pedis artery waveforms are monophasic. Left Lower Extremity: Dorsalis pedis artery waveform is monophasic. Posterior tibial artery waveform is multiphasic. 0.5-0.79 Moderate PAD IMPRESSION: Decreased RIGHT ankle brachial index indicative of moderate peripheral arterial disease. Electronically Signed   By: Aliene Lloyd M.D.   On: 03/13/2024 13:19   MR FOOT RIGHT WO CONTRAST Result Date: 03/12/2024 CLINICAL DATA:  Osteomyelitis, foot EXAM: MRI OF THE RIGHT FOREFOOT WITHOUT CONTRAST TECHNIQUE: Multiplanar, multisequence MR imaging of the right forefoot was performed. No intravenous contrast was administered. COMPARISON:  Foot radiographs 03/11/2024 FINDINGS: Bones/Joint/Cartilage A marker has been placed over the dorsal aspect of the small toe. There is evidence of soft tissue ulceration along the plantar aspect of the 5th metatarsophalangeal joint, further described below. The 5th metatarsal and 5th proximal phalanx appear normal. The 5th middle and distal phalanges are ankylosed with T2 marrow hyperintensity which persists on the sagittal inversion recovery images. No definite T1 signal abnormality or cortical destruction. The additional digits appear unremarkable. There are no significant joint effusions. No significant arthropathic changes in the forefoot or midfoot. Ligaments Intact Lisfranc ligament. Intact collateral ligaments of the metatarsophalangeal joints. Muscles and Tendons Unremarkable. No significant tenosynovitis or focal muscular atrophy. Soft tissues As above, apparent soft tissue ulceration along the plantar  aspect of the  5th metatarsophalangeal joint with associated skin thickening. No focal fluid collection, foreign body or soft tissue emphysema identified. The soft tissue changes are proximal to the marrow changes distally in the small toe, described above. Generalized dorsal subcutaneous edema noted without focal fluid collection. Small amount of intermetatarsal bursal fluid in the 1st web space. IMPRESSION: 1. Apparent soft tissue ulceration along the plantar aspect of the 5th metatarsophalangeal joint with associated skin thickening. No focal fluid collection, foreign body or soft tissue emphysema identified. 2. The 5th middle and distal phalanges are ankylosed with T2 marrow hyperintensity which persists on the sagittal inversion recovery images. No definite T1 signal abnormality or cortical destruction. These marrow changes appear removed from the described more proximal soft tissue findings. Findings are nonspecific and could be reactive or secondary to early osteomyelitis. Correlate with area of clinical concern. 3. No evidence of septic arthritis or tenosynovitis. 4. Nonspecific dorsal subcutaneous edema. Electronically Signed   By: Elsie Perone M.D.   On: 03/12/2024 08:31   Scheduled Meds:  enoxaparin  (LOVENOX ) injection  40 mg Subcutaneous Q24H   insulin  aspart  0-20 Units Subcutaneous Q4H   insulin  glargine-yfgn  11 Units Subcutaneous BID   magnesium  oxide  400 mg Oral BID   Continuous Infusions:  sodium chloride      ceFEPime  (MAXIPIME ) IV 2 g (03/13/24 1704)   metronidazole  500 mg (03/13/24 1051)   vancomycin  1,000 mg (03/13/24 0655)    LOS: 1 day   Rendall Carwin M.D on 03/13/2024 at 6:49 PM  Go to [email or web address] - for contact info  [facility] - Office  [phone]  If 7PM-7AM, please contact night-coverage [email or web address] 03/13/2024, 6:49 PM

## 2024-03-13 NOTE — Progress Notes (Signed)
 Report given to [facility] RN. [facility] at bedside and handoff given. Pt is axox4. BP elevated 171/89, prn hydralazine  given. All belongings sent with patient and family has been updated.

## 2024-03-14 ENCOUNTER — Encounter (HOSPITAL_COMMUNITY)
Admission: EM | Disposition: A | Discharge status: Home / Self Care | Payer: Self-pay | Source: Ambulatory Visit | Attending: Family Medicine

## 2024-03-14 ENCOUNTER — Ambulatory Visit: Payer: Self-pay | Admitting: Podiatry

## 2024-03-14 DIAGNOSIS — I70261 Atherosclerosis of native arteries of extremities with gangrene, right leg: Secondary | ICD-10-CM

## 2024-03-14 DIAGNOSIS — I70201 Unspecified atherosclerosis of native arteries of extremities, right leg: Secondary | ICD-10-CM

## 2024-03-14 DIAGNOSIS — I70291 Other atherosclerosis of native arteries of extremities, right leg: Secondary | ICD-10-CM

## 2024-03-14 DIAGNOSIS — E1152 Type 2 diabetes mellitus with diabetic peripheral angiopathy with gangrene: Secondary | ICD-10-CM

## 2024-03-14 DIAGNOSIS — M86171 Other acute osteomyelitis, right ankle and foot: Secondary | ICD-10-CM

## 2024-03-14 DIAGNOSIS — S91104A Unspecified open wound of right lesser toe(s) without damage to nail, initial encounter: Secondary | ICD-10-CM

## 2024-03-14 DIAGNOSIS — F17200 Nicotine dependence, unspecified, uncomplicated: Secondary | ICD-10-CM

## 2024-03-14 HISTORY — PX: LOWER EXTREMITY INTERVENTION: CATH118252

## 2024-03-14 HISTORY — PX: LOWER EXTREMITY ANGIOGRAPHY: CATH118251

## 2024-03-14 HISTORY — PX: PERIPHERAL VASCULAR THROMBECTOMY: CATH118306

## 2024-03-14 LAB — GLUCOSE, CAPILLARY
Glucose-Capillary: 216 mg/dL — ABNORMAL HIGH (ref 70–99)
Glucose-Capillary: 246 mg/dL — ABNORMAL HIGH (ref 70–99)
Glucose-Capillary: 247 mg/dL — ABNORMAL HIGH (ref 70–99)
Glucose-Capillary: 267 mg/dL — ABNORMAL HIGH (ref 70–99)
Glucose-Capillary: 294 mg/dL — ABNORMAL HIGH (ref 70–99)
Glucose-Capillary: 322 mg/dL — ABNORMAL HIGH (ref 70–99)
Glucose-Capillary: 60 mg/dL — ABNORMAL LOW (ref 70–99)
Glucose-Capillary: 98 mg/dL (ref 70–99)

## 2024-03-14 LAB — RENAL FUNCTION PANEL
Albumin: 2.1 g/dL — ABNORMAL LOW (ref 3.5–5.0)
Anion gap: 3 — ABNORMAL LOW (ref 5–15)
BUN: 15 mg/dL (ref 6–20)
CO2: 19 mmol/L — ABNORMAL LOW (ref 22–32)
Calcium: 7.9 mg/dL — ABNORMAL LOW (ref 8.9–10.3)
Chloride: 113 mmol/L — ABNORMAL HIGH (ref 98–111)
Creatinine, Ser: 1.07 mg/dL — ABNORMAL HIGH (ref 0.44–1.00)
GFR, Estimated: >60 mL/min (ref >60)
Glucose, Bld: 381 mg/dL — ABNORMAL HIGH (ref 70–99)
Phosphorus: 2.2 mg/dL — ABNORMAL LOW (ref 2.5–4.6)
Potassium: 4.2 mmol/L (ref 3.5–5.1)
Sodium: 135 mmol/L (ref 135–145)

## 2024-03-14 LAB — SURGICAL PCR SCREEN
MRSA, PCR: NEGATIVE
Staphylococcus aureus: NEGATIVE

## 2024-03-14 LAB — POCT ACTIVATED CLOTTING TIME: Activated Clotting Time: 227 s

## 2024-03-14 SURGERY — LOWER EXTREMITY ANGIOGRAPHY
Anesthesia: LOCAL | Laterality: Right

## 2024-03-14 MED ORDER — SODIUM CHLORIDE 0.9 % WEIGHT BASED INFUSION
1.0000 mL/kg/h | INTRAVENOUS | Status: DC
  Administered 2024-03-14: 1 mL/kg/h via INTRAVENOUS

## 2024-03-14 MED ORDER — LIDOCAINE HCL (PF) 1 % IJ SOLN
INTRAMUSCULAR | Status: DC | PRN
  Administered 2024-03-14: 5 mL

## 2024-03-14 MED ORDER — CLOPIDOGREL BISULFATE 75 MG PO TABS
75.0000 mg | ORAL_TABLET | Freq: Every day | ORAL | Status: DC
  Administered 2024-03-15 – 2024-03-16 (×2): 75 mg via ORAL
  Filled 2024-03-14 (×2): qty 1

## 2024-03-14 MED ORDER — MIDAZOLAM HCL 2 MG/2ML IJ SOLN
INTRAMUSCULAR | Status: AC
  Filled 2024-03-14: qty 2

## 2024-03-14 MED ORDER — SODIUM CHLORIDE 0.9% FLUSH
3.0000 mL | Freq: Two times a day (BID) | INTRAVENOUS | Status: DC
  Administered 2024-03-15: 3 mL via INTRAVENOUS

## 2024-03-14 MED ORDER — OXYCODONE HCL 5 MG PO TABS
5.0000 mg | ORAL_TABLET | ORAL | Status: DC | PRN
  Administered 2024-03-14 – 2024-03-15 (×2): 5 mg via ORAL
  Administered 2024-03-16 (×2): 10 mg via ORAL
  Filled 2024-03-14 (×2): qty 2
  Filled 2024-03-14 (×2): qty 1

## 2024-03-14 MED ORDER — SODIUM CHLORIDE 0.9 % IV SOLN: INTRAVENOUS | Status: DC

## 2024-03-14 MED ORDER — IODIXANOL 320 MG/ML IV SOLN
INTRAVENOUS | Status: DC | PRN
  Administered 2024-03-14: 100 mL

## 2024-03-14 MED ORDER — ASPIRIN 81 MG PO TBEC
81.0000 mg | DELAYED_RELEASE_TABLET | Freq: Every day | ORAL | Status: DC
  Administered 2024-03-15: 81 mg via ORAL
  Filled 2024-03-14 (×2): qty 1

## 2024-03-14 MED ORDER — ACETAMINOPHEN 325 MG PO TABS
650.0000 mg | ORAL_TABLET | ORAL | Status: DC | PRN
  Administered 2024-03-14 – 2024-03-16 (×4): 650 mg via ORAL
  Filled 2024-03-14 (×4): qty 2

## 2024-03-14 MED ORDER — DEXTROSE 50 % IV SOLN
1.0000 | Freq: Once | INTRAVENOUS | Status: AC
  Administered 2024-03-14: 50 mL via INTRAVENOUS
  Filled 2024-03-14: qty 50

## 2024-03-14 MED ORDER — HEPARIN SODIUM (PORCINE) 1000 UNIT/ML IJ SOLN
INTRAMUSCULAR | Status: AC
  Filled 2024-03-14: qty 10

## 2024-03-14 MED ORDER — DEXTROSE-SODIUM CHLORIDE 5-0.9 % IV SOLN: INTRAVENOUS | Status: DC

## 2024-03-14 MED ORDER — INSULIN ASPART 100 UNIT/ML IJ SOLN
0.0000 [IU] | INTRAMUSCULAR | Status: DC
  Administered 2024-03-14: 3 [IU] via SUBCUTANEOUS
  Administered 2024-03-14: 2 [IU] via SUBCUTANEOUS
  Administered 2024-03-15: 3 [IU] via SUBCUTANEOUS
  Administered 2024-03-15: 4 [IU] via SUBCUTANEOUS
  Administered 2024-03-15 (×2): 2 [IU] via SUBCUTANEOUS

## 2024-03-14 MED ORDER — HYDRALAZINE HCL 20 MG/ML IJ SOLN
INTRAMUSCULAR | Status: AC
  Filled 2024-03-14: qty 1

## 2024-03-14 MED ORDER — HEPARIN (PORCINE) IN NACL 1000-0.9 UT/500ML-% IV SOLN
INTRAVENOUS | Status: DC | PRN
  Administered 2024-03-14: 1000 mL

## 2024-03-14 MED ORDER — LIDOCAINE HCL (PF) 1 % IJ SOLN
INTRAMUSCULAR | Status: AC
Start: 2024-03-14 — End: 2024-03-14
  Filled 2024-03-14: qty 30

## 2024-03-14 MED ORDER — SODIUM CHLORIDE 0.9 % IV SOLN: 250.0000 mL | INTRAVENOUS | Status: DC | PRN

## 2024-03-14 MED ORDER — HYDRALAZINE HCL 20 MG/ML IJ SOLN
INTRAMUSCULAR | Status: DC | PRN
  Administered 2024-03-14: 20 mg via INTRAVENOUS

## 2024-03-14 MED ORDER — MUPIROCIN 2 % EX OINT
1.0000 | TOPICAL_OINTMENT | Freq: Two times a day (BID) | CUTANEOUS | Status: DC
  Administered 2024-03-14 – 2024-03-16 (×4): 1 via NASAL
  Filled 2024-03-14: qty 22

## 2024-03-14 MED ORDER — HYDRALAZINE HCL 20 MG/ML IJ SOLN: 5.0000 mg | INTRAMUSCULAR | Status: DC | PRN

## 2024-03-14 MED ORDER — MIDAZOLAM HCL 2 MG/2ML IJ SOLN
INTRAMUSCULAR | Status: DC | PRN
Start: 2024-03-14 — End: 2024-03-14
  Administered 2024-03-14: 1 mg via INTRAVENOUS
  Administered 2024-03-14: 2 mg via INTRAVENOUS

## 2024-03-14 MED ORDER — FENTANYL CITRATE (PF) 100 MCG/2ML IJ SOLN
INTRAMUSCULAR | Status: DC | PRN
  Administered 2024-03-14: 25 ug via INTRAVENOUS
  Administered 2024-03-14: 50 ug via INTRAVENOUS

## 2024-03-14 MED ORDER — FENTANYL CITRATE (PF) 100 MCG/2ML IJ SOLN
INTRAMUSCULAR | Status: AC
  Filled 2024-03-14: qty 2

## 2024-03-14 MED ORDER — HEPARIN SODIUM (PORCINE) 1000 UNIT/ML IJ SOLN
INTRAMUSCULAR | Status: DC | PRN
  Administered 2024-03-14: 6000 [IU] via INTRAVENOUS
  Administered 2024-03-14: 1000 [IU] via INTRAVENOUS

## 2024-03-14 MED ORDER — SODIUM CHLORIDE 0.9% FLUSH: 3.0000 mL | INTRAVENOUS | Status: DC | PRN

## 2024-03-14 SURGICAL SUPPLY — 23 items
BALLOON MUSTANG 5X200X135 (BALLOONS) IMPLANT
CATH JETI 6FR 120 (CATHETERS) IMPLANT
CATH OMNI FLUSH 5F 65CM (CATHETERS) IMPLANT
CATH QUICKCROSS SUPP .035X90CM (MICROCATHETER) IMPLANT
DEVICE TORQUE SEADRAGON GRN (MISCELLANEOUS) IMPLANT
DEVICE VASC CLSR CELT ART 6 (Vascular Products) IMPLANT
GUIDEWIRE ANGLED .035 180CM (WIRE) IMPLANT
KIT ACCESSORY JETI (KITS) IMPLANT
KIT ENCORE 26 ADVANTAGE (KITS) IMPLANT
KIT MICROPUNCTURE NIT STIFF (SHEATH) IMPLANT
KIT SINGLE USE MANIFOLD (KITS) IMPLANT
KIT SYRINGE INJ CVI SPIKEX1 (MISCELLANEOUS) IMPLANT
PACK CARDIAC CATHETERIZATION (CUSTOM PROCEDURE TRAY) IMPLANT
SET ATX-X65L (MISCELLANEOUS) IMPLANT
SHEATH CATAPULT 6FR 45 (SHEATH) IMPLANT
SHEATH PINNACLE 5F 10CM (SHEATH) IMPLANT
SHEATH PINNACLE 6F 10CM (SHEATH) IMPLANT
STENT ELUVIA 6X120X130 (Permanent Stent) IMPLANT
STENT ELUVIA 6X150X130 (Permanent Stent) IMPLANT
STENT ELUVIA 6X40X130 (Permanent Stent) IMPLANT
WIRE BENTSON .035X145CM (WIRE) IMPLANT
WIRE G V18X300CM (WIRE) IMPLANT
WIRE HI TORQ VERSACORE 300 (WIRE) IMPLANT

## 2024-03-14 NOTE — Anesthesia Preprocedure Evaluation (Addendum)
 Anesthesia Evaluation  Patient identified by MRN, date of birth, ID band Patient awake    Reviewed: Allergy & Precautions, H&P , NPO status , Patient's Chart, lab work & pertinent test results  Airway Mallampati: II  TM Distance: >3 FB Neck ROM: Full    Dental no notable dental hx. (+) Teeth Intact, Dental Advisory Given   Pulmonary neg pulmonary ROS, Patient abstained from smoking., former smoker   Pulmonary exam normal breath sounds clear to auscultation       Cardiovascular Exercise Tolerance: Good hypertension,  Rhythm:Regular Rate:Normal     Neuro/Psych   Anxiety Depression    negative neurological ROS     GI/Hepatic negative GI ROS, Neg liver ROS,,,  Endo/Other  diabetes, Type 1, Insulin  Dependent, Oral Hypoglycemic Agents    Renal/GU negative Renal ROS  negative genitourinary   Musculoskeletal   Abdominal   Peds  Hematology negative hematology ROS (+)   Anesthesia Other Findings   Reproductive/Obstetrics negative OB ROS                              Anesthesia Physical Anesthesia Plan  ASA: 3  Anesthesia Plan: MAC   Post-op Pain Management: Tylenol  PO (pre-op)* and Toradol  IV (intra-op)*   Induction: Intravenous  PONV Risk Score and Plan: 3 and Propofol  infusion, Midazolam  and Ondansetron   Airway Management Planned: Natural Airway and Simple Face Mask  Additional Equipment:   Intra-op Plan:   Post-operative Plan:   Informed Consent: I have reviewed the patients History and Physical, chart, labs and discussed the procedure including the risks, benefits and alternatives for the proposed anesthesia with the patient or authorized representative who has indicated his/her understanding and acceptance.     Dental advisory given  Plan Discussed with: CRNA  Anesthesia Plan Comments:          Anesthesia Quick Evaluation

## 2024-03-14 NOTE — Consult Note (Signed)
 Hospital Consult    MRN #:  [id number]  History of Present Illness: This is a 48 y.o. female was admitted with a diabetic right fifth toe wound.  She has had the wound for many weeks but has not healed.  She denies any previous necessarily history.  She denies any previous claudication or rest pain.  She denies any previous foot wounds.  She is a intermittent smoker.  Dr. Malvin is already involved for care of the toe wound.  Past Medical History:  Diagnosis Date   Diabetes mellitus without complication (HCC)    High cholesterol    Hypertension     History reviewed. No pertinent surgical history.  Allergies  Allergen Reactions   Amoxicillin Other (See Comments)    Unknown    Shellfish Allergy Other (See Comments)    Unknown     Prior to Admission medications   Medication Sig Start Date End Date Taking? Authorizing Provider  aspirin  EC 81 MG tablet Take 81 mg by mouth daily.   Yes [provider]  gabapentin  (NEURONTIN ) 100 MG capsule Take 1 capsule (100 mg total) by mouth 3 (three) times daily. 01/29/24  Yes [name], [name], FNP  glipiZIDE  (GLUCOTROL  XL) 5 MG 24 hr tablet Take 1 tablet (5 mg total) by mouth daily with breakfast. 11/15/23  Yes Bevely Doffing, FNP  insulin  glargine-yfgn (SEMGLEE ) 100 UNIT/ML Pen Inject 20 Units into the skin at bedtime. 03/11/24  Yes Nida, Gebreselassie W, MD  Blood Glucose Monitoring Suppl (ACCU-CHEK GUIDE ME) w/Device KIT 1 Piece by Does not apply route as directed. 03/11/24   Lenis Ethelle ORN, MD  Continuous Glucose Receiver (FREESTYLE LIBRE 3 READER) DEVI 1 Piece by Does not apply route once as needed for up to 1 dose. 03/11/24   Nida, Gebreselassie W, MD  Continuous Glucose Sensor (FREESTYLE LIBRE 3 PLUS SENSOR) MISC Change sensor every 15 days. 03/11/24   Nida, Gebreselassie W, MD  glucose blood (ACCU-CHEK GUIDE TEST) test strip Use to monitor glucose 4 times daily  as instructed 03/11/24   Nida, Gebreselassie W, MD  Insulin  Pen  Needle (BD PEN NEEDLE NANO U/F) 32G X 4 MM MISC Use once daily 03/11/24   [name], [name], FNP  Lancets (FREESTYLE) lancets Test blood glucose four times daily. Use as instructed 11/15/23   Bevely Doffing, FNP  insulin  glargine (LANTUS  SOLOSTAR) 100 UNIT/ML Solostar Pen Inject 24 Units into the skin at bedtime. 06/30/21 05/11/22  Lenis Ethelle ORN, MD    Social History   Socioeconomic History   Marital status: Single    Spouse name: Not on file   Number of children: 2   Years of education: Not on file   Highest education level: Not on file  Occupational History   Not on file  Tobacco Use   Smoking status: Former    Current packs/day: 0.00    Types: Cigarettes    Quit date: 06/29/2018    Years since quitting: 5.7    Passive exposure: Past   Smokeless tobacco: Never   Tobacco comments:    She smoked 10 cigarettes a day for 15 years , quit 2019.   Vaping Use   Vaping status: Never Used  Substance and Sexual Activity   Alcohol use: Yes    Comment: occa   Drug use: No   Sexual activity: Not Currently  Other Topics Concern   Not on file  Social History Narrative   Lives with her son.    Social Drivers of Health  Financial Resource Strain: Not on file  Food Insecurity: No Food Insecurity (03/12/2024)   Hunger Vital Sign    Worried About Running Out of Food in the Last Year: Never true    Ran Out of Food in the Last Year: Never true  Transportation Needs: No Transportation Needs (03/12/2024)   PRAPARE - Administrator, Civil Service (Medical): No    Lack of Transportation (Non-Medical): No  Physical Activity: Not on file  Stress: Not on file  Social Connections: Not on file  Intimate Partner Violence: Not At Risk (03/12/2024)   Humiliation, Afraid, Rape, and Kick questionnaire    Fear of Current or Ex-Partner: No    Emotionally Abused: No    Physically Abused: No    Sexually Abused: No    Family History  Problem Relation Age of Onset   Diabetes Mother     Hypertension Mother    Asthma Brother    Breast cancer Maternal Grandmother    Asthma Son    Colon cancer Neg Hx    Cancer - Lung Neg Hx    Cervical cancer Neg Hx     ROS: Otherwise negative unless mentioned in HPI  Physical Examination  Vitals:   03/14/24 0416 03/14/24 0757  BP: 126/72 127/71  Pulse: 95 92  Resp: 15 16  Temp: 98.6 F (37 C) 98.1 F (36.7 C)  SpO2: 100% 100%   Body mass index is 24.44 kg/m.  General: no acute distress Cardiac: hemodynamically stable Pulm: normal work of breathing Neuro: alert, no focal deficit Extremities: Right fifth toe wound with some skin necrosis and drainage Vascular:   Right: Palpable femoral  Left: Palpable femoral   Data:   ABI Right 0.64 Left 1.02    ASSESSMENT/PLAN: This is a 48 y.o. female CL TI with a right fifth toe wound and a ABI of 0.64.  We discussed the bleeding stick with the angiogram.  The risk benefits were reviewed.  I explained that this is necessary as she likely does not have adequate perfusion to heal the wound at this time even with amputation. She elected to proceed, will be added to the Cath Lab schedule later this afternoon for Dr. Serene  - Keep n.p.o. Plan for right lower extremity angiogram in the Cath Lab with Dr. Serene later today   Norman GORMAN Serve MD [facility] [phone] 03/14/2024  8:12 AM

## 2024-03-14 NOTE — Interval H&P Note (Signed)
 History and Physical Interval Note:  03/14/2024 12:29 PM  Anne Wright  has presented today for surgery, with the diagnosis of pad.  The various methods of treatment have been discussed with the patient and family. After consideration of risks, benefits and other options for treatment, the patient has consented to  Procedure(s): Lower Extremity Angiography (N/A) as a surgical intervention.  The patient's history has been reviewed, patient examined, no change in status, stable for surgery.  I have reviewed the patient's chart and labs.  Questions were answered to the patient's satisfaction.     Malvina New

## 2024-03-14 NOTE — TOC Initial Note (Addendum)
 Transition of Care [facility]) - Initial/Assessment Note    Patient Details  Name: Anne Wright MRN: [id number] Date of Birth: May 22, 1976  [facility] of [facility]) CM/SW Contact:    Rosalva Jon Bloch, RN Phone Number: 03/14/2024, 9:24 AM  Clinical Narrative:                 Presents with right fifth toe wound. Hx of diabetes mellitus, hypertension and dyslipidemia, intermittent smoker.  NCM spoke with pt regarding d/c planning. Pt from home with supportive husband. Independent with ADL's , no DME usage. Works [employer]. Pt without transportation issues. States has no problems affording RX meds. PCP confirmed, [name]  [name], FNP  Plan : right lower extremity angiogram later today   [facility] team following and will assist with needs as presented.   Expected Discharge Plan: Home/Self Care Barriers to Discharge: Continued Medical Work up   Patient Goals and CMS Choice            Expected Discharge Plan and Services                                              Prior Living Arrangements/Services   Lives with:: Spouse Patient language and need for interpreter reviewed:: Yes Do you feel safe going back to the place where you live?: Yes      Need for Family Participation in Patient Care: Yes (Comment) Care giver support system in place?: Yes (comment)   Criminal Activity/Legal Involvement Pertinent to Current Situation/Hospitalization: No - Comment as needed  Activities of Daily Living   ADL Screening (condition at time of admission) Independently performs ADLs?: Yes (appropriate for developmental age) Is the patient deaf or have difficulty hearing?: No Does the patient have difficulty seeing, even when wearing glasses/contacts?: No Does the patient have difficulty concentrating, remembering, or making decisions?: No  Permission Sought/Granted                  Emotional Assessment       Orientation: : Oriented to Self, Oriented to Place, Oriented to  Time,  Oriented to Situation Alcohol / Substance Use: Not Applicable Psych Involvement: No (comment)  Admission diagnosis:  Diabetic foot infection (HCC) [Z88.371, L08.9] Patient Active Problem List   Diagnosis Date Noted   Diabetic foot infection (HCC) 03/12/2024   Type 2 diabetes mellitus with peripheral neuropathy (HCC) 03/12/2024   Essential hypertension 03/12/2024   Dyslipidemia 03/12/2024   Diabetic neuropathy, type II diabetes mellitus (HCC) 01/29/2024   Cellulitis of right foot 01/29/2024   Trigger finger, right middle finger 09/22/2022   Callus of foot 08/06/2022   Need for immunization against influenza 04/21/2022   Non-adherence to medical treatment 11/01/2021   Encounter for annual general medical examination with abnormal findings in adult 09/16/2021   Elevated liver enzymes 09/16/2021   Bacterial vaginosis 09/15/2021   Smoker 09/15/2021   Ingrown nail of great toe 09/07/2021   Dizziness 09/07/2021   Finger pain, right 09/07/2021   Vitamin D  deficiency 06/05/2020   Essential hypertension, benign 01/20/2020   Mixed hyperlipidemia 01/06/2020   Anxiety 11/14/2019   Depressive disorder 11/14/2019   Uncontrolled type 2 diabetes mellitus with hyperglycemia (HCC) 10/08/2018   PCP:  [name], [name], FNP Pharmacy:   [facility] [address], [address] [place] [address] Phone: [phone] Fax: [phone]  [id number] -  [place], [place] - [address] [place] [address] Phone: [phone] Fax: [phone]  [id number] - [place],  - [address] [place] [address] Phone: [phone] Fax: [phone]     Social Drivers of Health (SDOH) Social History: SDOH Screenings   Food Insecurity: No Food Insecurity (03/12/2024)  Housing: Low Risk  (03/12/2024)  Transportation Needs: No Transportation Needs  (03/12/2024)  Utilities: Not At Risk (03/12/2024)  Depression (PHQ2-9): High Risk (01/29/2024)  Tobacco Use: Medium Risk (03/11/2024)   SDOH Interventions:     Readmission Risk Interventions    03/12/2024   10:43 AM  Readmission Risk Prevention Plan  Post Dischage Appt Complete  Medication Screening Complete  Transportation Screening Complete

## 2024-03-14 NOTE — Progress Notes (Signed)
 Blood pressure was high during procedure when balloon inflations were occurring. Receiving nurse made aware.  Blood pressure medication administered and BP decreasing inclemently and made receiving nurse aware of administration and post orders had been released and BP PRN medications post. If further blood pressure medication is needed, Floor nurse will administer.

## 2024-03-14 NOTE — H&P (View-Only) (Signed)
 Hospital Consult    MRN #:  [id number]  History of Present Illness: This is a 49 y.o. female was admitted with a diabetic right fifth toe wound.  She has had the wound for many weeks but has not healed.  She denies any previous necessarily history.  She denies any previous claudication or rest pain.  She denies any previous foot wounds.  She is a intermittent smoker.  Dr. Malvin is already involved for care of the toe wound.  Past Medical History:  Diagnosis Date   Diabetes mellitus without complication (HCC)    High cholesterol    Hypertension     History reviewed. No pertinent surgical history.  Allergies  Allergen Reactions   Amoxicillin Other (See Comments)    Unknown    Shellfish Allergy Other (See Comments)    Unknown     Prior to Admission medications   Medication Sig Start Date End Date Taking? Authorizing Provider  aspirin  EC 81 MG tablet Take 81 mg by mouth daily.   Yes [provider]  gabapentin  (NEURONTIN ) 100 MG capsule Take 1 capsule (100 mg total) by mouth 3 (three) times daily. 01/29/24  Yes [name], [name], FNP  glipiZIDE  (GLUCOTROL  XL) 5 MG 24 hr tablet Take 1 tablet (5 mg total) by mouth daily with breakfast. 11/15/23  Yes Bevely Doffing, FNP  insulin  glargine-yfgn (SEMGLEE ) 100 UNIT/ML Pen Inject 20 Units into the skin at bedtime. 03/11/24  Yes Nida, Gebreselassie W, MD  Blood Glucose Monitoring Suppl (ACCU-CHEK GUIDE ME) w/Device KIT 1 Piece by Does not apply route as directed. 03/11/24   Lenis Ethelle ORN, MD  Continuous Glucose Receiver (FREESTYLE LIBRE 3 READER) DEVI 1 Piece by Does not apply route once as needed for up to 1 dose. 03/11/24   Nida, Gebreselassie W, MD  Continuous Glucose Sensor (FREESTYLE LIBRE 3 PLUS SENSOR) MISC Change sensor every 15 days. 03/11/24   Nida, Gebreselassie W, MD  glucose blood (ACCU-CHEK GUIDE TEST) test strip Use to monitor glucose 4 times daily  as instructed 03/11/24   Nida, Gebreselassie W, MD  Insulin  Pen  Needle (BD PEN NEEDLE NANO U/F) 32G X 4 MM MISC Use once daily 03/11/24   [name], [name], FNP  Lancets (FREESTYLE) lancets Test blood glucose four times daily. Use as instructed 11/15/23   Bevely Doffing, FNP  insulin  glargine (LANTUS  SOLOSTAR) 100 UNIT/ML Solostar Pen Inject 24 Units into the skin at bedtime. 06/30/21 05/11/22  Lenis Ethelle ORN, MD    Social History   Socioeconomic History   Marital status: Single    Spouse name: Not on file   Number of children: 2   Years of education: Not on file   Highest education level: Not on file  Occupational History   Not on file  Tobacco Use   Smoking status: Former    Current packs/day: 0.00    Types: Cigarettes    Quit date: 06/29/2018    Years since quitting: 5.7    Passive exposure: Past   Smokeless tobacco: Never   Tobacco comments:    She smoked 10 cigarettes a day for 15 years , quit 2019.   Vaping Use   Vaping status: Never Used  Substance and Sexual Activity   Alcohol use: Yes    Comment: occa   Drug use: No   Sexual activity: Not Currently  Other Topics Concern   Not on file  Social History Narrative   Lives with her son.    Social Drivers of Health  Financial Resource Strain: Not on file  Food Insecurity: No Food Insecurity (03/12/2024)   Hunger Vital Sign    Worried About Running Out of Food in the Last Year: Never true    Ran Out of Food in the Last Year: Never true  Transportation Needs: No Transportation Needs (03/12/2024)   PRAPARE - Administrator, Civil Service (Medical): No    Lack of Transportation (Non-Medical): No  Physical Activity: Not on file  Stress: Not on file  Social Connections: Not on file  Intimate Partner Violence: Not At Risk (03/12/2024)   Humiliation, Afraid, Rape, and Kick questionnaire    Fear of Current or Ex-Partner: No    Emotionally Abused: No    Physically Abused: No    Sexually Abused: No    Family History  Problem Relation Age of Onset   Diabetes Mother     Hypertension Mother    Asthma Brother    Breast cancer Maternal Grandmother    Asthma Son    Colon cancer Neg Hx    Cancer - Lung Neg Hx    Cervical cancer Neg Hx     ROS: Otherwise negative unless mentioned in HPI  Physical Examination  Vitals:   03/14/24 0416 03/14/24 0757  BP: 126/72 127/71  Pulse: 95 92  Resp: 15 16  Temp: 98.6 F (37 C) 98.1 F (36.7 C)  SpO2: 100% 100%   Body mass index is 24.44 kg/m.  General: no acute distress Cardiac: hemodynamically stable Pulm: normal work of breathing Neuro: alert, no focal deficit Extremities: Right fifth toe wound with some skin necrosis and drainage Vascular:   Right: Palpable femoral  Left: Palpable femoral   Data:   ABI Right 0.64 Left 1.02    ASSESSMENT/PLAN: This is a 48 y.o. female CL TI with a right fifth toe wound and a ABI of 0.64.  We discussed the bleeding stick with the angiogram.  The risk benefits were reviewed.  I explained that this is necessary as she likely does not have adequate perfusion to heal the wound at this time even with amputation. She elected to proceed, will be added to the Cath Lab schedule later this afternoon for Dr. Serene  - Keep n.p.o. Plan for right lower extremity angiogram in the Cath Lab with Dr. Serene later today   Norman GORMAN Serve MD [facility] [phone] 03/14/2024  8:12 AM

## 2024-03-14 NOTE — Progress Notes (Signed)
 [facility]   ROUNDING   NOTE Jocelyn Valin FMW:[id number]  DOB: February 12, 1976  DOA: 03/11/2024  PCP: [name], [name], FNP  03/14/2024,11:17 AM  LOS: 2 days    Code Status: Full code     from: Home current Dispo: Unclear    48 year old female-works currently at a nursing [place]  DM TY 2 diagnosed at age 56 follows with nephrology-nonadherent on meds-secondary to lack of resources no insurance last A1c 12.6--she is on glipizide  XL 5 Lantus  20 and has a continuous glucometer HTN Dyslipidemia in the past attributable to fatty liver disease per Dr. Tonie note 2023 HLD 8/11 seen by endocrinologist with foot wound --- sent to [facility] ED found to have an infected right toe Podiatry vascular consulted-patient initially not wishing amputation   Plan  Diabetic foot wound Going for aortogram today under Dr. Pearline may need amputation of right little toe-continue cefepime  Flagyl  Vanco currently May continue gabapentin  300 twice daily  Poorly controlled DM TY 2- in between insurances did not know that she could buy insulin  at [facility] for 4 hours CBGs low this morning given D50 Placed on D5 NS until surgery with every 4 coverage only Holding PTA glipizide  Lantus  and resume postoperatively if she can eat  Fatty liver disease dyslipidemia Needs outpatient characterization further by Dr. Cindie on follow-up if needed can repeat LFTs as an outpatient  HTN CKD 3--mild metabolic acidosis Not on meds PTA?  On hydralazine -pressures were elevated on arrival but now are 120s over 70s Would add amlodipine and possibly ACE as an outpatient if kidney function continues to look good Repeat labs a.m.-include magnesium   Hypokalemia on admission Recheck labs in a.m.    Data Reviewed:   Sodium 135 potassium 4.2 chloride 113 CO2 19 BUN/creatinine 15/1.0 Anion gap 3 phosphorus 2.2 albumin 2.1  DVT prophylaxis: Lovenox   Status is: Inpatient Remains inpatient appropriate because:   Needs surgery    Subjective: Awake coherent pleasant no distress She is n.p.o. for procedure and about to go for Does not have significant pain  Objective + exam Vitals:   03/13/24 2147 03/13/24 2346 03/14/24 0416 03/14/24 0757  BP:  (!) 157/84 126/72 127/71  Pulse:  (!) 102 95 92  Resp:  16 15 16   Temp:  98.5 F (36.9 C) 98.6 F (37 C) 98.1 F (36.7 C)  TempSrc:  Oral Oral Oral  SpO2:  100% 100% 100%  Weight: 60.6 kg     Height: 5' 2 (1.575 m)      Filed Weights   03/11/24 1445 03/13/24 2147  Weight: 54.9 kg 60.6 kg     Examination: EOMI NCAT no focal deficit no icterus or pallor neck soft supple Chest clear Abdomen soft no rebound no guarding Examined right lower extremity and she has dry gangrene over the right little toe Power is 5/5 I think I feel pulses in the DP     Scheduled Meds:  aspirin  EC  81 mg Oral Q breakfast   atorvastatin   20 mg Oral Daily   enoxaparin  (LOVENOX ) injection  40 mg Subcutaneous Q24H   gabapentin   300 mg Oral BID   insulin  aspart  0-6 Units Subcutaneous Q4H   magnesium  oxide  400 mg Oral BID   Continuous Infusions:  sodium chloride  83 mL/hr at 03/14/24 1003   ceFEPime  (MAXIPIME ) IV 2 g (03/14/24 0604)   metronidazole  500 mg (03/14/24 1008)   vancomycin  1,000 mg (03/13/24 0655)    Time 45  Colen Grimes, MD  [facility]

## 2024-03-14 NOTE — Consult Note (Signed)
 PODIATRY CONSULTATION  NAME Anne Wright MRN [id number] DOB 16-Oct-1975 DOA 03/11/2024   Reason for consult:  Chief Complaint  Patient presents with   Foot Pain    Infection in right pinky toe    Attending/Consulting physician: DOROTHA Grimes MD  History of present illness: 48 y.o. female with medical history significant for uncontrolled DM, hypertension and dyslipidemia admitted on 03/12/2024 with left foot diabetic infection in the setting of PAD and poor diabetic control  Patient states that the changes in her right fifth toe came on rapidly over the past couple weeks.  She is not sure what started to change.  She has a history of diabetes which is poorly controlled her A1c was 12.6.  Had a discussion with the patient  regarding the MRI findings and concern for osteomyelitis as well as necrotic tissue of the fifth toe.  We discussed treatment options in detail.  She was given an option on which where she want to proceed.  She is okay with proceeding with amputation at this time.  Past Medical History:  Diagnosis Date   Diabetes mellitus without complication (HCC)    High cholesterol    Hypertension        Latest Ref Rng & Units 03/13/2024    4:38 AM 03/12/2024    1:39 AM 03/11/2024    4:22 PM  CBC  WBC 4.0 - 10.5 K/uL 7.0  8.2  8.8   Hemoglobin 12.0 - 15.0 g/dL 89.4  88.8  86.5   Hematocrit 36.0 - 46.0 % 31.0  34.3  40.6   Platelets 150 - 400 K/uL 266  296  326        Latest Ref Rng & Units 03/14/2024    6:05 AM 03/13/2024    4:38 AM 03/12/2024   11:08 PM  BMP  Glucose 70 - 99 mg/dL 618  865  461   BUN 6 - 20 mg/dL 15  18    Creatinine 9.55 - 1.00 mg/dL 8.92  9.06    Sodium 864 - 145 mmol/L 135  139    Potassium 3.5 - 5.1 mmol/L 4.2  3.2    Chloride 98 - 111 mmol/L 113  110    CO2 22 - 32 mmol/L 19  22    Calcium  8.9 - 10.3 mg/dL 7.9  8.0        Physical Exam: Lower Extremity Exam Right foot DP and PT pulses nonpalpable  Gangrenous changes noted to the fifth  toe where there is ulceration at the medial aspect as well as necrotic ulceration underlying the toe.  Hyperkeratotic tissue plantar aspect of the fifth metatarsal head  Sensation is diminished to the right forefoot        ASSESSMENT/PLAN OF CARE 48 y.o. female with PMHx significant for uncontrolled DM 2 with neuropathy hypertension dyslipidemia with gangrene and concern for PAD and resultant early osteomyelitis of the right fifth toe.  WBC 7 CRP 0.9 MRI right foot without contrast: Concern for marrow changes of the fifth middle phalanx concerning for osteomyelitis versus reactive.  Suggested correlate with area of clinical concern.  -N.p.o. past midnight for OR tomorrow AM 0730 for right fifth toe amputation MPJ level.  She is agreeable to proceed at this time.  I did offer alternative method of treatment would be monitoring on antibiotics however I feel that she will ultimately need the amputation at some point in the future given the extent of necrosis and concern for underlying osteomyelitis of the fifth toe.  She elects to proceed with amputation.  -Appreciate vascular surgery plan for angiogram today - Continue IV abx broad spectrum pending further culture data - Anticoagulation: Okay to continue per primary/vascular recommendations - Wound care: Betadine paint to the toe preop - WB status: Weightbearing as tolerated in postop shoe following surgery tomorrow - Will continue to follow   Thank you for the consult.  Please contact me directly with any questions or concerns.           Anne Wright, DPM [facility] / [facility]    [address] [place], [place] [address]                Office [phone]  Fax [phone]

## 2024-03-14 NOTE — Op Note (Signed)
 Patient name: Anne Wright MRN: [id number] DOB: Apr 05, 1976 Sex: female  03/11/2024 - 03/14/2024 Pre-operative Diagnosis: Right fifth toe ulcer Post-operative diagnosis:  Same Surgeon:  Malvina New Procedure Performed:  1.  Ultrasound-guided access, left femoral artery  2.  Aortobifemoral angiogram  3.  Right leg runoff  4.  Selective injections with the catheter in the right external iliac artery and right popliteal artery  5.  Mechanical thrombectomy using the Jeddi device of the right superficial femoral artery  6.  Stent, right superficial femoral artery  7.  Conscious sedation, 68 minutes  8.  Closure device, Celt   Indications: This is a 48 year old female with a right fifth toe wound.  She has underlying vascular disease and so angiography is indicated prior to amputation.  Procedure:  The patient was identified in the holding area and taken to room 8.  The patient was then placed supine on the table and prepped and draped in the usual sterile fashion.  A time out was called.  Conscious sedation was administered with the use of IV fentanyl  and Versed  under continuous physician and nurse monitoring.  Heart rate, blood pressure, and oxygen saturation were continuously monitored.  Total sedation time was 68 minutes.  Ultrasound was used to evaluate the left common femoral artery.  It was patent .  A digital ultrasound image was acquired.  A micropuncture needle was used to access the left common femoral artery under ultrasound guidance.  An 018 wire was advanced without resistance and a micropuncture sheath was placed.  The 018 wire was removed and a benson wire was placed.  The micropuncture sheath was exchanged for a 5 french sheath.  An omniflush catheter was advanced over the wire to the level of L-1.  An abdominal angiogram was obtained.  Next, using the omniflush catheter and a benson wire, the aortic bifurcation was crossed and the catheter was placed into theright external iliac  artery and right runoff was obtained.    Findings:   Aortogram: No significant renal artery stenosis was visualized.  The infrarenal abdominal aorta was widely patent.  Bilateral common and external iliac arteries were widely patent.  Bilateral common femoral arteries were widely patent.  Right Lower Extremity: Right common femoral and profundofemoral artery are widely patent.  The superficial femoral artery was occluded with reconstitution in the adductor canal.  There is two-vessel runoff via the posterior tibial and peroneal artery    Intervention: Ultrasound images were acquired the decision was made to proceed with intervention.  A 6 x 45 catapult sheath was advanced over the bifurcation into the right external iliac artery and the patient was fully heparinized.  Additional imaging was performed with the catheter at this level to identify the stump of the superficial femoral artery.  Using a 035 Glidewire and a quick cross catheter, subintimal recanalization was performed with reentry into the above-knee popliteal artery, which was confirmed with a selective injection with the catheter in the popliteal artery.  The wire went very quickly suggesting fresh thrombus.  Therefore I elected to treat this initially with mechanical thrombectomy to avoid distal embolization.  A V-18 wire was inserted into the popliteal artery and the quick cross catheter was removed.  I then performed mechanical thrombectomy using the Jeddi device.  2 complete passes down the back were performed.  A versa core wire was then placed.  I elected to primarily stent the artery.  I placed an Eluvia 6 x 150, 6 x 120, and 6  x 40 stent landing at the origin of the superficial femoral artery.  This was postdilated with a 5 mm balloon.  Completion imaging was performed which showed a widely patent superficial femoral-popliteal artery with no change in runoff.  The long sheath was exchanged out for a short 6 French sheath in the left groin  was closed with a Celt  Impression:  #1  Occluded right superficial femoral artery successfully crossed and stented using overlapping 6 mm Eluvia stents  #2  Patient has two-vessel runoff via the posterior tibial and peroneal artery  #3  She has been maximally revascularized  #4  She will need aspirin , statin, and Plavix     ALONSO Malvina New, M.D., [facility] [place] Office: [phone] Pager:  [phone]

## 2024-03-15 ENCOUNTER — Other Ambulatory Visit: Payer: Self-pay

## 2024-03-15 ENCOUNTER — Inpatient Hospital Stay (HOSPITAL_COMMUNITY): Payer: Self-pay | Admitting: Anesthesiology

## 2024-03-15 ENCOUNTER — Encounter (HOSPITAL_COMMUNITY): Payer: Self-pay | Admitting: Family Medicine

## 2024-03-15 ENCOUNTER — Encounter (HOSPITAL_COMMUNITY)
Admission: EM | Disposition: A | Discharge status: Home / Self Care | Payer: Self-pay | Source: Ambulatory Visit | Attending: Family Medicine

## 2024-03-15 ENCOUNTER — Inpatient Hospital Stay (HOSPITAL_COMMUNITY): Payer: Self-pay

## 2024-03-15 DIAGNOSIS — M869 Osteomyelitis, unspecified: Secondary | ICD-10-CM

## 2024-03-15 DIAGNOSIS — Z89421 Acquired absence of other right toe(s): Secondary | ICD-10-CM

## 2024-03-15 DIAGNOSIS — E1069 Type 1 diabetes mellitus with other specified complication: Secondary | ICD-10-CM

## 2024-03-15 DIAGNOSIS — Z87891 Personal history of nicotine dependence: Secondary | ICD-10-CM

## 2024-03-15 DIAGNOSIS — Z9889 Other specified postprocedural states: Secondary | ICD-10-CM

## 2024-03-15 DIAGNOSIS — I10 Essential (primary) hypertension: Secondary | ICD-10-CM

## 2024-03-15 HISTORY — PX: AMPUTATION: SHX166

## 2024-03-15 LAB — BASIC METABOLIC PANEL WITH GFR
Anion gap: 3 — ABNORMAL LOW (ref 5–15)
BUN: 18 mg/dL (ref 6–20)
CO2: 20 mmol/L — ABNORMAL LOW (ref 22–32)
Calcium: 7.7 mg/dL — ABNORMAL LOW (ref 8.9–10.3)
Chloride: 110 mmol/L (ref 98–111)
Creatinine, Ser: 1.17 mg/dL — ABNORMAL HIGH (ref 0.44–1.00)
GFR, Estimated: 58 mL/min — ABNORMAL LOW (ref >60)
Glucose, Bld: 255 mg/dL — ABNORMAL HIGH (ref 70–99)
Potassium: 3.9 mmol/L (ref 3.5–5.1)
Sodium: 133 mmol/L — ABNORMAL LOW (ref 135–145)

## 2024-03-15 LAB — GLUCOSE, CAPILLARY
Glucose-Capillary: 232 mg/dL — ABNORMAL HIGH (ref 70–99)
Glucose-Capillary: 153 mg/dL — ABNORMAL HIGH (ref 70–99)
Glucose-Capillary: 225 mg/dL — ABNORMAL HIGH (ref 70–99)
Glucose-Capillary: 181 mg/dL — ABNORMAL HIGH (ref 70–99)
Glucose-Capillary: 304 mg/dL — ABNORMAL HIGH (ref 70–99)
Glucose-Capillary: 267 mg/dL — ABNORMAL HIGH (ref 70–99)

## 2024-03-15 LAB — CBC
HCT: 26.8 % — ABNORMAL LOW (ref 36.0–46.0)
Hemoglobin: 8.7 g/dL — ABNORMAL LOW (ref 12.0–15.0)
MCH: 31.1 pg (ref 26.0–34.0)
MCHC: 32.5 g/dL (ref 30.0–36.0)
MCV: 95.7 fL (ref 80.0–100.0)
Platelets: 251 K/uL (ref 150–400)
RBC: 2.8 MIL/uL — ABNORMAL LOW (ref 3.87–5.11)
RDW: 13.4 % (ref 11.5–15.5)
WBC: 8 K/uL (ref 4.0–10.5)
nRBC: 0 % (ref 0.0–0.2)

## 2024-03-15 LAB — LIPID PANEL
Cholesterol: 117 mg/dL (ref 0–200)
HDL: 44 mg/dL (ref >40)
LDL Cholesterol: 52 mg/dL (ref 0–99)
Total CHOL/HDL Ratio: 2.7 ratio
Triglycerides: 103 mg/dL (ref <150)
VLDL: 21 mg/dL (ref 0–40)

## 2024-03-15 LAB — PREGNANCY, URINE: Preg Test, Ur: NEGATIVE

## 2024-03-15 LAB — MAGNESIUM: Magnesium: 1.5 mg/dL — ABNORMAL LOW (ref 1.7–2.4)

## 2024-03-15 SURGERY — AMPUTATION, FOOT, RAY
Anesthesia: Monitor Anesthesia Care | Laterality: Right

## 2024-03-15 MED ORDER — SODIUM CHLORIDE 0.9 % IV BOLUS
1000.0000 mL | Freq: Once | INTRAVENOUS | Status: AC
  Administered 2024-03-15: 1000 mL via INTRAVENOUS

## 2024-03-15 MED ORDER — FENTANYL CITRATE (PF) 250 MCG/5ML IJ SOLN
INTRAMUSCULAR | Status: AC
  Filled 2024-03-15: qty 5

## 2024-03-15 MED ORDER — MAGNESIUM OXIDE -MG SUPPLEMENT 400 (240 MG) MG PO TABS
800.0000 mg | ORAL_TABLET | Freq: Two times a day (BID) | ORAL | Status: DC
  Administered 2024-03-15 – 2024-03-16 (×3): 800 mg via ORAL
  Filled 2024-03-15 (×3): qty 2

## 2024-03-15 MED ORDER — FENTANYL CITRATE (PF) 250 MCG/5ML IJ SOLN
INTRAMUSCULAR | Status: DC | PRN
  Administered 2024-03-15: 50 ug via INTRAVENOUS

## 2024-03-15 MED ORDER — PROPOFOL 1000 MG/100ML IV EMUL
INTRAVENOUS | Status: AC
Start: 2024-03-15 — End: 2024-03-15
  Filled 2024-03-15: qty 100

## 2024-03-15 MED ORDER — ONDANSETRON HCL 4 MG/2ML IJ SOLN
INTRAMUSCULAR | Status: AC
  Filled 2024-03-15: qty 2

## 2024-03-15 MED ORDER — LIDOCAINE 2% (20 MG/ML) 5 ML SYRINGE
INTRAMUSCULAR | Status: DC | PRN
  Administered 2024-03-15: 40 mg via INTRAVENOUS

## 2024-03-15 MED ORDER — PROPOFOL 500 MG/50ML IV EMUL
INTRAVENOUS | Status: DC | PRN
  Administered 2024-03-15: 100 ug/kg/min via INTRAVENOUS
  Administered 2024-03-15: 20 mg via INTRAVENOUS
  Administered 2024-03-15: 30 mg via INTRAVENOUS

## 2024-03-15 MED ORDER — ORAL CARE MOUTH RINSE: 15.0000 mL | Freq: Once | OROMUCOSAL | Status: AC

## 2024-03-15 MED ORDER — CHLORHEXIDINE GLUCONATE 0.12 % MT SOLN
15.0000 mL | Freq: Once | OROMUCOSAL | Status: AC
  Administered 2024-03-15: 15 mL via OROMUCOSAL
  Filled 2024-03-15: qty 15

## 2024-03-15 MED ORDER — MIDAZOLAM HCL 2 MG/2ML IJ SOLN
INTRAMUSCULAR | Status: AC
  Filled 2024-03-15: qty 2

## 2024-03-15 MED ORDER — MIDAZOLAM HCL 2 MG/2ML IJ SOLN
INTRAMUSCULAR | Status: DC | PRN
  Administered 2024-03-15: 2 mg via INTRAVENOUS

## 2024-03-15 MED ORDER — PHENYLEPHRINE 80 MCG/ML (10ML) SYRINGE FOR IV PUSH (FOR BLOOD PRESSURE SUPPORT)
PREFILLED_SYRINGE | INTRAVENOUS | Status: DC | PRN
  Administered 2024-03-15: 120 ug via INTRAVENOUS
  Administered 2024-03-15: 80 ug via INTRAVENOUS
  Administered 2024-03-15: 120 ug via INTRAVENOUS

## 2024-03-15 MED ORDER — INSULIN GLARGINE-YFGN 100 UNIT/ML ~~LOC~~ SOLN
18.0000 [IU] | Freq: Every day | SUBCUTANEOUS | Status: DC
  Administered 2024-03-15 – 2024-03-16 (×2): 18 [IU] via SUBCUTANEOUS
  Filled 2024-03-15 (×2): qty 0.18

## 2024-03-15 MED ORDER — DOXYCYCLINE HYCLATE 100 MG PO TABS
100.0000 mg | ORAL_TABLET | Freq: Two times a day (BID) | ORAL | Status: DC
  Administered 2024-03-15 – 2024-03-16 (×3): 100 mg via ORAL
  Filled 2024-03-15 (×3): qty 1

## 2024-03-15 MED ORDER — LIDOCAINE HCL 1 % IJ SOLN
INTRAMUSCULAR | Status: DC | PRN
  Administered 2024-03-15: 10 mL

## 2024-03-15 MED ORDER — VANCOMYCIN HCL 500 MG IV SOLR
INTRAVENOUS | Status: AC
  Filled 2024-03-15: qty 10

## 2024-03-15 MED ORDER — BUPIVACAINE HCL (PF) 0.5 % IJ SOLN
INTRAMUSCULAR | Status: AC
  Filled 2024-03-15: qty 10

## 2024-03-15 MED ORDER — LIDOCAINE HCL (PF) 1 % IJ SOLN
INTRAMUSCULAR | Status: AC
  Filled 2024-03-15: qty 10

## 2024-03-15 MED ORDER — ONDANSETRON HCL 4 MG/2ML IJ SOLN
INTRAMUSCULAR | Status: DC | PRN
  Administered 2024-03-15: 4 mg via INTRAVENOUS

## 2024-03-15 MED ORDER — MAGNESIUM SULFATE IN D5W 1-5 GM/100ML-% IV SOLN
1.0000 g | Freq: Once | INTRAVENOUS | Status: AC
  Administered 2024-03-15: 1 g via INTRAVENOUS
  Filled 2024-03-15 (×2): qty 100

## 2024-03-15 MED ORDER — SODIUM CHLORIDE 0.9 % IR SOLN
Status: DC | PRN
  Administered 2024-03-15: 1000 mL

## 2024-03-15 MED ORDER — LACTATED RINGERS IV SOLN: INTRAVENOUS | Status: DC

## 2024-03-15 MED ORDER — LIDOCAINE 2% (20 MG/ML) 5 ML SYRINGE
INTRAMUSCULAR | Status: AC
Start: 2024-03-15 — End: 2024-03-15
  Filled 2024-03-15: qty 5

## 2024-03-15 MED ORDER — INSULIN ASPART 100 UNIT/ML IJ SOLN
0.0000 [IU] | Freq: Three times a day (TID) | INTRAMUSCULAR | Status: DC
  Administered 2024-03-15: 3 [IU] via SUBCUTANEOUS
  Administered 2024-03-16 (×2): 1 [IU] via SUBCUTANEOUS

## 2024-03-15 MED ORDER — SODIUM CHLORIDE 0.9 % IV SOLN: INTRAVENOUS | Status: DC

## 2024-03-15 MED ORDER — HYDROMORPHONE HCL 1 MG/ML IJ SOLN: 0.2500 mg | INTRAMUSCULAR | Status: DC | PRN

## 2024-03-15 SURGICAL SUPPLY — 37 items
BLADE AVERAGE 25X9 (BLADE) IMPLANT
BLADE SURG 10 STRL SS (BLADE) ×1 IMPLANT
BLADE SURG 15 STRL LF DISP TIS (BLADE) ×1 IMPLANT
BNDG COHESIVE 3X5 TAN ST LF (GAUZE/BANDAGES/DRESSINGS) ×1 IMPLANT
BNDG COMPR ESMARK 4X3 LF (GAUZE/BANDAGES/DRESSINGS) ×1 IMPLANT
BNDG ELASTIC 3INX 5YD STR LF (GAUZE/BANDAGES/DRESSINGS) ×1 IMPLANT
BNDG ELASTIC 4INX 5YD STR LF (GAUZE/BANDAGES/DRESSINGS) IMPLANT
BNDG GAUZE DERMACEA FLUFF 4 (GAUZE/BANDAGES/DRESSINGS) IMPLANT
BNDG STRETCH 4X75 NS LF (GAUZE/BANDAGES/DRESSINGS) IMPLANT
CHLORAPREP W/TINT 26 (MISCELLANEOUS) IMPLANT
DRSG ADAPTIC 3X8 NADH LF (GAUZE/BANDAGES/DRESSINGS) IMPLANT
DRSG XEROFORM 1X8 (GAUZE/BANDAGES/DRESSINGS) IMPLANT
ELECTRODE REM PT RTRN 9FT ADLT (ELECTROSURGICAL) ×1 IMPLANT
GAUZE PAD ABD 8X10 STRL (GAUZE/BANDAGES/DRESSINGS) IMPLANT
GAUZE SPONGE 2X2 STRL 8-PLY (GAUZE/BANDAGES/DRESSINGS) IMPLANT
GAUZE SPONGE 4X4 12PLY STRL (GAUZE/BANDAGES/DRESSINGS) ×1 IMPLANT
GAUZE STRETCH 2X75IN STRL (MISCELLANEOUS) ×1 IMPLANT
GAUZE XEROFORM 1X8 LF (GAUZE/BANDAGES/DRESSINGS) ×1 IMPLANT
GLOVE BIO SURGEON STRL SZ7.5 (GLOVE) ×1 IMPLANT
GLOVE BIOGEL PI IND STRL 7.5 (GLOVE) ×1 IMPLANT
GOWN STRL REUS W/ TWL LRG LVL3 (GOWN DISPOSABLE) ×2 IMPLANT
KIT BASIN OR (CUSTOM PROCEDURE TRAY) ×1 IMPLANT
NDL HYPO 25X1 1.5 SAFETY (NEEDLE) ×1 IMPLANT
NEEDLE HYPO 25X1 1.5 SAFETY (NEEDLE) ×1 IMPLANT
PACK ORTHO EXTREMITY (CUSTOM PROCEDURE TRAY) ×1 IMPLANT
PADDING CAST ABS COTTON 4X4 ST (CAST SUPPLIES) ×2 IMPLANT
SET HNDPC FAN SPRY TIP SCT (DISPOSABLE) IMPLANT
SPIKE FLUID TRANSFER (MISCELLANEOUS) IMPLANT
STOCKINETTE 4X48 STRL (DRAPES) IMPLANT
SUT ETHILON 3 0 FSLX (SUTURE) IMPLANT
SUT PROLENE 3 0 PS 2 (SUTURE) IMPLANT
SUT PROLENE 4 0 PS 2 18 (SUTURE) IMPLANT
SYR CONTROL 10ML LL (SYRINGE) ×1 IMPLANT
TUBE CONNECTING 12X1/4 (SUCTIONS) IMPLANT
UNDERPAD 30X36 HEAVY ABSORB (UNDERPADS AND DIAPERS) ×1 IMPLANT
WATER STERILE IRR 1000ML POUR (IV SOLUTION) ×1 IMPLANT
YANKAUER SUCT BULB TIP NO VENT (SUCTIONS) IMPLANT

## 2024-03-15 NOTE — Progress Notes (Signed)
 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 [place]  DM TY 2 diagnosed at age 59 follows with nephrology-nonadherent on meds-secondary to lack of resources no insurance last A1c 12.6--she is on glipizide  XL 5 Lantus  20 and has a continuous glucometer HTN Dyslipidemia in the past attributable to fatty liver disease per Anne Wright note 2023 HLD 8/11 seen by endocrinologist with foot wound --- sent to [facility] ED found to have an infected right toe Podiatry vascular consulted-patient initially not wishing amputation 8/14 Anne Wright ultrasound-guided left femoral access with mechanical thrombectomy of Anne Wright device R SFA with stent R SFA 8/15 R fifth toe MPJ amputation  Plan  Hypotension, blood loss anemia Combination likely her menses, had some anesthesia this morning -mild blood loss during angiography performed 8/14 Hemoglobin has trended downward baseline 13 to above 8.7 Blood pressure responded to crystalloid 500 cc bolus-nursing to indicate if drops below a MAP of 65--- keep rate of saline today 40 cc/H Left groin does not seem indurated or seem to have a hematoma I will check her hemoglobin again tomorrow with iron studies type and screen and maybe start on FeSO4  PAD with successful stenting of SFA as above Will need to be on aspirin  81 Plavix  75 atorvastatin  20 and as needed follow-up Anne Wright  Diabetic foot wound status post little toe amputation as above May continue gabapentin  300 twice daily Postoperatively ambulation postop boot and start ambulating Can use doxycycline  100 twice daily X 5 days per podiatrist   Poorly controlled DM TY 2- in between insurances did not know that she could buy insulin  at [facility] hours Hypoglycemia was preop as she was  n.p.o.-discontinue D5 at this time--- resume glipizide  at discharge Continue sliding scale 3 times daily--- resume Lantus  at dose of 18  Fatty liver disease dyslipidemia Needs outpatient characterization further by Anne Wright on follow-up if needed can repeat LFTs as an outpatient  HTN CKD 3--mild metabolic acidosis Hold meds for now-acidosis should correct in the next 24 to 48 hours  Hypokalemia on admission Hypokalemia is resolved increase Mag-Ox 800 twice daily  give 1 g IV of magnesium    Data Reviewed:   Sodium 133 potassium 3.9 CO2 20 BUN/creatinine 18/1.1 magnesium  1.5  Hemoglobin down from 13.4 to current 8.7, platelet 251  DVT prophylaxis: Lovenox   Status is: Inpatient Remains inpatient appropriate because:   Await stability likely discharge soon   Subjective:  Had headache earlier today and on arrival from PACU was hypotensive with a MAP of 62 Seen at bedside she seems okay she tells me she is having her periods this is day 4 and her menses are lightening Significant other was at bedside No other real complaints no pain in left groin  Objective + exam Vitals:   03/15/24 0930 03/15/24 1000 03/15/24 1116 03/15/24 1131  BP: 104/68 91/68 (!) 93/58 (!) 107/59  Pulse: 98 99 100 100  Resp: 16 17 20 18   Temp:   98.6 F (37 C)   TempSrc:   Oral   SpO2: 100% 100% 100%   Weight:      Height:       Filed Weights   03/11/24 1445 03/13/24 2147 03/15/24 0650  Weight: 54.9 kg 60.6  kg (P) 60.6 kg     Examination: EOMI NCAT looks fair, feels a little tired I think Chest is clear no wheeze rales rhonchi ROM is intact Moving 4 limbs equally Left groin does not seem to have a hematoma it is soft and nontender I did not examine the wound on the right foot   Scheduled Meds:  aspirin  EC  81 mg Oral Q breakfast   aspirin  EC  81 mg Oral Daily   atorvastatin   20 mg Oral Daily   clopidogrel   75 mg Oral Q breakfast   doxycycline   100 mg Oral Q12H   enoxaparin   (LOVENOX ) injection  40 mg Subcutaneous Q24H   gabapentin   300 mg Oral BID   insulin  aspart  0-6 Units Subcutaneous TID WC   insulin  glargine-yfgn  18 Units Subcutaneous Q24H   magnesium  oxide  800 mg Oral BID   mupirocin  ointment  1 Application Nasal BID   sodium chloride  flush  3 mL Intravenous Q12H   Continuous Infusions:  sodium chloride      sodium chloride      magnesium  sulfate bolus IVPB      Time 55  Anne Leighanne Adolph, MD  [facility]

## 2024-03-15 NOTE — Op Note (Signed)
 Full Operative Report  Date of Operation: 7:10 AM, 03/15/2024   Patient: Anne Wright - 48 y.o. female  Surgeon: Malvin Marsa FALCON, DPM   Assistant: None  Diagnosis: OSTEOMYLETITIS and gangrene of right fifth toe  Procedure:  1. Amputation of right fifth toe at MPJ level    Anesthesia: Monitor Anesthesia Care  No responsible provider has been recorded for the case.  Anesthesiologist: Epifanio Fallow, MD CRNA: Atanacio Arland HERO, CRNA   Estimated Blood Loss: Minimal   Hemostasis: 1) Anatomical dissection, mechanical compression, electrocautery 2) No tourniquet was used  Implants: * No implants in log *  Materials: prolene 3-0  Injectables: 1) Pre-operatively: 10 cc of 50:50 mixture 1%lidocaine  plain and 0.5% marcaine  plain 2) Post-operatively: None   Specimens: - Pathology: right foot fifth toe for path - Microbiology: culture of right fifth toe   Antibiotics: IV antibiotics given per schedule on the floor  Drains: None  Complications: Patient tolerated the procedure well without complication.   Operative findings: As below in detailed report  Indications for Procedure: Anne Wright presents to [facility], Marsa FALCON, DPM with a chief complaint of gangrene and concern for osteomyelitis in the right fifth toe. The patient has failed conservative treatments of various modalities. At this time the patient has elected to proceed with surgical correction. All alternatives, risks, and complications of the procedures were thoroughly explained to the patient. Patient exhibits appropriate understanding of all discussion points and informed consent was signed and obtained in the chart with no guarantees to surgical outcome given or implied.  Description of Procedure: Patient was brought to the operating room. Patient remained on their hospital bed in the supine position. A surgical timeout was performed and all members of the operating room, the procedure, and  the surgical site were identified. anesthesia occurred as per anesthesia record. Local anesthetic as previously described was then injected about the operative field in a local infiltrative block.  The operative lower extremity as noted above was then prepped and draped in the usual sterile manner. The following procedure then began.  Attention was directed to the fifth digit on the RIGHT foot. A full-thickness incision encompassing the entire digit was made using a #15 blade. Dissection was carried down to bone. The toe was secured with a towel clamp, further dissected in its entirety, and disarticulated at the MPJ and passed to the back table as a gross specimen. This was then labled and sent to pathology. The bone was noted to be soft and eroded, and consistent with osteomyelitis. A bone culture was harvested with rongeur. All remaining necrotic and devitalized soft tissue structures were visualized and dissected away using sharp and dull dissection. Care was taken to protect all neurovascular structures throughout the dissection. All bleeders were cauterized as necessary.  The area was then flushed with copious amounts of sterile saline. Then using the suture materials previously described, the site was closed in anatomic layers and the skin was well approximated under minimal tension.   The surgical site was then dressed with xeroform 4x4 gauze kerlix and ace. The patient tolerated both the procedure and anesthesia well with vital signs stable throughout. The patient was transferred in good condition and all vital signs stable  from the OR to recovery under the discretion of anesthesia.  Condition: Vital signs stable, neurovascular status unchanged from preoperative   Surgical plan:  Expect clean margin, adequate bleeding noted at the amputation site for healing, though I stressed need for smoking cessation with her pre  op. Stable for DC home tomrorow on 5 days po doxycycline . Follow up in office next  week Thursday or Friday office to call to arrange.  The patient will be WBAT in a post op shoe to the operative limb until further instructed. The dressing is to remain clean, dry, and intact. Will continue to follow unless noted elsewhere.   Marsa Honour, DPM [facility]

## 2024-03-15 NOTE — Anesthesia Postprocedure Evaluation (Signed)
 Anesthesia Post Note  Patient: Anne Wright  Procedure(s) Performed: AMPUTATION, FOOT, RAY (Right)     Patient location during evaluation: PACU Anesthesia Type: MAC Level of consciousness: awake and alert Pain management: pain level controlled Vital Signs Assessment: post-procedure vital signs reviewed and stable Respiratory status: spontaneous breathing, nonlabored ventilation and respiratory function stable Cardiovascular status: stable and blood pressure returned to baseline Postop Assessment: no apparent nausea or vomiting Anesthetic complications: no   No notable events documented.  Last Vitals:  Vitals:   03/15/24 0804 03/15/24 0815  BP: 102/66 123/76  Pulse: 95 98  Resp: 20 12  Temp: 36.6 C 36.6 C  SpO2: 100% 100%    Last Pain:  Vitals:   03/15/24 0815  TempSrc:   PainSc: 0-No pain                 Aliceson Dolbow,W. EDMOND

## 2024-03-15 NOTE — Progress Notes (Addendum)
  Progress Note    03/15/2024 3:20 PM * Day of Surgery *  Subjective:  no complaints    Vitals:   03/15/24 1131 03/15/24 1200  BP: (!) 107/59 (!) 96/59  Pulse: 100 99  Resp: 18 20  Temp:    SpO2:      Physical Exam: General:  resting comfortably Cardiac:  regular Lungs:  nonlabored Incisions:  left groin cath site c/d/l without hematoma Extremities:  right foot bandaged and dry. Brisk right PT doppler signal   CBC    Component Value Date/Time   WBC 8.0 03/15/2024 0319   RBC 2.80 (L) 03/15/2024 0319   HGB 8.7 (L) 03/15/2024 0319   HGB 11.9 09/22/2022 0900   HCT 26.8 (L) 03/15/2024 0319   HCT 36.7 09/22/2022 0900   PLT 251 03/15/2024 0319   PLT 255 09/22/2022 0900   MCV 95.7 03/15/2024 0319   MCV 93 09/22/2022 0900   MCH 31.1 03/15/2024 0319   MCHC 32.5 03/15/2024 0319   RDW 13.4 03/15/2024 0319   RDW 12.6 09/22/2022 0900   LYMPHSABS 2.4 03/11/2024 1622   LYMPHSABS 2.9 09/22/2022 0900   MONOABS 0.8 03/11/2024 1622   EOSABS 0.1 03/11/2024 1622   EOSABS 0.6 (H) 09/22/2022 0900   BASOSABS 0.1 03/11/2024 1622   BASOSABS 0.0 09/22/2022 0900    BMET    Component Value Date/Time   NA 133 (L) 03/15/2024 0319   NA 130 (L) 11/15/2023 1152   K 3.9 03/15/2024 0319   CL 110 03/15/2024 0319   CO2 20 (L) 03/15/2024 0319   GLUCOSE 255 (H) 03/15/2024 0319   BUN 18 03/15/2024 0319   BUN 29 (H) 11/15/2023 1152   CREATININE 1.17 (H) 03/15/2024 0319   CREATININE 0.66 05/19/2020 0750   CALCIUM  7.7 (L) 03/15/2024 0319   GFRNONAA 58 (L) 03/15/2024 0319   GFRNONAA 109 05/19/2020 0750   GFRAA 126 05/19/2020 0750    INR No results found for: INR   Intake/Output Summary (Last 24 hours) at 03/15/2024 1520 Last data filed at 03/15/2024 0915 Gross per 24 hour  Intake 1160.22 ml  Output 5 ml  Net 1155.22 ml      Assessment/Plan:  48 y.o. female is 1 day post op, s/p: right SFA mechanical thrombectomy and stenting   -She is doing well today and reports minimal  pain currently in her right foot -She underwent successful right SFA stenting yesterday and is maximally revascularized on the right -She has a brisk right PT doppler signal -Podiatry performed right fifth toe amputation today -Left groin cath site is soft without hematoma -Continue asa, plavix , and statin. Ok for discharge home from vascular standpoint. Will arrange follow up in 4-5 weeks with noninvasive studies   Ahmed Holster, PA-C [facility] [phone] 03/15/2024 3:20 PM  I agree with the above.  I have seen and evaluate the patient.  She underwent SFA recanalization and stenting yesterday and toe amputation today.  She is doing very well from my perspective and can go home when ready.  She will need to be on aspirin , statin and Plavix .  Malvina New

## 2024-03-15 NOTE — Progress Notes (Signed)
 Orthopedic Tech Progress Note Patient Details:  Anne Wright May 14, 1976 [id number]  Ortho Devices Type of Ortho Device: Postop shoe/boot Ortho Device/Splint Location: RLE Ortho Device/Splint Interventions: Ordered, Application   Post Interventions Patient Tolerated: Well  [name] [name] 03/15/2024, 10:22 AM

## 2024-03-15 NOTE — Progress Notes (Signed)
 History and Physical Interval Note:  03/15/2024 7:03 AM  Anne Wright  has presented today for surgery, with the diagnosis of gangrene and suspected osteomyelitis of the right fifth toe.  The various methods of treatment have been discussed with the patient and family. After consideration of risks, benefits and other options for treatment, the patient has consented to   Procedure(s) with comments: AMPUTATION, TOE RIGHT - RIGHT 5TH TOE AMPUTATION as a surgical intervention.  The patient's history has been reviewed, patient examined, no change in status, stable for surgery.  I have reviewed the patient's chart and labs.  Questions were answered to the patient's satisfaction.     Anne Wright

## 2024-03-15 NOTE — Transfer of Care (Signed)
 Immediate Anesthesia Transfer of Care Note  Patient: Anne Wright  Procedure(s) Performed: AMPUTATION, FOOT, RAY (Right)  Patient Location: PACU  Anesthesia Type:MAC  Level of Consciousness: awake and alert   Airway & Oxygen Therapy: Patient Spontanous Breathing  Post-op Assessment: Report given to RN and Post -op Vital signs reviewed and stable  Post vital signs: Reviewed and stable  Last Vitals:  Vitals Value Taken Time  BP 102/66   Temp 98.7   Pulse 94 03/15/24 08:02  Resp 18   SpO2 100 % 03/15/24 08:02  Vitals shown include unfiled device data.  Last Pain:  Vitals:   03/15/24 0650  TempSrc: (P) Oral  PainSc:       Patients Stated Pain Goal: 0 (03/14/24 1928)  Complications: No notable events documented.

## 2024-03-16 ENCOUNTER — Encounter: Payer: Self-pay | Admitting: Family Medicine

## 2024-03-16 ENCOUNTER — Encounter (HOSPITAL_COMMUNITY): Payer: Self-pay | Admitting: Podiatry

## 2024-03-16 DIAGNOSIS — Z89421 Acquired absence of other right toe(s): Secondary | ICD-10-CM

## 2024-03-16 LAB — BASIC METABOLIC PANEL WITH GFR
Anion gap: 5 (ref 5–15)
BUN: 17 mg/dL (ref 6–20)
CO2: 23 mmol/L (ref 22–32)
Calcium: 8.2 mg/dL — ABNORMAL LOW (ref 8.9–10.3)
Chloride: 109 mmol/L (ref 98–111)
Creatinine, Ser: 1.16 mg/dL — ABNORMAL HIGH (ref 0.44–1.00)
GFR, Estimated: 59 mL/min — ABNORMAL LOW (ref >60)
Glucose, Bld: 194 mg/dL — ABNORMAL HIGH (ref 70–99)
Potassium: 4.6 mmol/L (ref 3.5–5.1)
Sodium: 137 mmol/L (ref 135–145)

## 2024-03-16 LAB — CBC
HCT: 24.6 % — ABNORMAL LOW (ref 36.0–46.0)
Hemoglobin: 8.1 g/dL — ABNORMAL LOW (ref 12.0–15.0)
MCH: 31.6 pg (ref 26.0–34.0)
MCHC: 32.9 g/dL (ref 30.0–36.0)
MCV: 96.1 fL (ref 80.0–100.0)
Platelets: 236 K/uL (ref 150–400)
RBC: 2.56 MIL/uL — ABNORMAL LOW (ref 3.87–5.11)
RDW: 13.4 % (ref 11.5–15.5)
WBC: 7.3 K/uL (ref 4.0–10.5)
nRBC: 0 % (ref 0.0–0.2)

## 2024-03-16 LAB — IRON AND TIBC
Iron: 9 ug/dL — ABNORMAL LOW (ref 28–170)
Saturation Ratios: 3 % — ABNORMAL LOW (ref 10.4–31.8)
TIBC: 270 ug/dL (ref 250–450)
UIBC: 261 ug/dL

## 2024-03-16 LAB — GLUCOSE, CAPILLARY
Glucose-Capillary: 174 mg/dL — ABNORMAL HIGH (ref 70–99)
Glucose-Capillary: 164 mg/dL — ABNORMAL HIGH (ref 70–99)

## 2024-03-16 MED ORDER — CLOPIDOGREL BISULFATE 75 MG PO TABS: 75.0000 mg | ORAL_TABLET | Freq: Every day | ORAL | 12 refills | Status: AC

## 2024-03-16 MED ORDER — OXYCODONE HCL 5 MG PO TABS: 5.0000 mg | ORAL_TABLET | Freq: Four times a day (QID) | ORAL | 0 refills | Status: AC | PRN

## 2024-03-16 MED ORDER — ATORVASTATIN CALCIUM 20 MG PO TABS: 20.0000 mg | ORAL_TABLET | Freq: Every day | ORAL | 12 refills | Status: AC

## 2024-03-16 MED ORDER — DOXYCYCLINE HYCLATE 100 MG PO TABS: 100.0000 mg | ORAL_TABLET | Freq: Two times a day (BID) | ORAL | 0 refills | Status: AC

## 2024-03-16 MED ORDER — FERROUS SULFATE 325 (65 FE) MG PO TABS
325.0000 mg | ORAL_TABLET | Freq: Two times a day (BID) | ORAL | Status: DC
  Administered 2024-03-16: 325 mg via ORAL
  Filled 2024-03-16: qty 1

## 2024-03-16 MED ORDER — FERROUS SULFATE 325 (65 FE) MG PO TABS: 325.0000 mg | ORAL_TABLET | Freq: Two times a day (BID) | ORAL | 3 refills | Status: AC

## 2024-03-16 MED ORDER — GABAPENTIN 300 MG PO CAPS: 300.0000 mg | ORAL_CAPSULE | Freq: Two times a day (BID) | ORAL | 0 refills | Status: AC

## 2024-03-16 MED ORDER — INSULIN GLARGINE-YFGN 100 UNIT/ML ~~LOC~~ SOPN: 20.0000 [IU] | PEN_INJECTOR | Freq: Every day | SUBCUTANEOUS | 1 refills | Status: DC

## 2024-03-16 MED ORDER — ACETAMINOPHEN 325 MG PO TABS
650.0000 mg | ORAL_TABLET | ORAL | Status: AC | PRN
Start: 2024-03-16 — End: ?

## 2024-03-16 MED ORDER — GLIPIZIDE ER 5 MG PO TB24: 5.0000 mg | ORAL_TABLET | Freq: Every day | ORAL | 1 refills | Status: AC

## 2024-03-16 NOTE — Discharge Summary (Signed)
 Physician Discharge Summary  Anne Wright FMW:[id number] DOB: 11-21-1975 DOA: 03/11/2024  PCP: Wright, [name], FNP  Admit date: 03/11/2024 Discharge date: 03/16/2024  Time spent: 70 minutes  Recommendations for Outpatient Follow-up:  Complete doxycycline  as outpatient All scripts printed for patient and encouraged patient to obtain all of her medications through [facility] Labs in the outpatient setting in about 2 weeks should include Chem-12 CBC Needs A1c in 2 to 3 months  Discharge Diagnoses:  MAIN problem for hospitalization   Diabetic foot wound\ PAD status post stenting Postop hypotension with anemia of acute superimposed on chronic blood loss Uncontrolled diabetes mellitus  Please see below for itemized issues addressed in HOpsital- refer to other progress notes for clarity if needed  Discharge Condition: Improved  Diet recommendation: Heart healthy diabetic  Filed Weights   03/11/24 1445 03/13/24 2147 03/15/24 0650  Weight: 54.9 kg 60.6 kg (P) 60.6 kg    History of present illness:  48 year old female-works currently at a nursing [employer]  DM TY 2 diagnosed at age 89 follows with nephrology/endocrinology-nonadherent on meds-secondary to lack of resources no insurance last A1c 12.6--she is on glipizide  XL 5 Lantus  20 and has a continuous glucometer but has not used these in several months HTN Dyslipidemia in the past attributable to fatty liver disease per [facility] note 2023 HLD 8/11 seen by endocrinologist with foot wound --- sent to [facility] ED found to have an infected right toe Podiatry vascular consulted-patient initially not wishing amputation 8/14 [facility] ultrasound-guided left femoral access with mechanical thrombectomy of [facility] device R SFA with stent R SFA 8/15 R fifth toe MPJ amputation   Plan   Hypotension, blood loss anemia Combination likely her menses, angiography performed 8/14 Hemoglobin has trended downward baseline 13 to above 8.1 Left  groin does not seem indurated or seem to have a hematoma Received IV fluid boluses and saline temporarily and pressures seem to improve to the 120-150 range at discharge she was ambulating in the hallway without dizziness or lightheadedness Patient was started on twice daily FeSO4 needs iron studies in about 3 weeks, [low saturation and iron level here] and outpatient follow-up for this   PAD with successful stenting of SFA as above Given prescriptions of aspirin  81 Plavix  75 atorvastatin  20 and as needed follow-up [facility]   Diabetic foot wound status post little toe amputation as above May continue gabapentin  300 twice daily-prescription given Postoperatively ambulation postop boot and start ambulating Can use doxycycline  100 twice daily X 5 days per podiatrist total at discharge Work note given to be renewed by podiatry Will need nephropathy/ophthalmology close follow-up   Poorly controlled DM TY 2- in between insurances did not know that she could buy insulin  at [facility] for 4 hours Hypoglycemia was preop as she was n.p.o.- Sugars have remained quite stable during [facility] stay on regimen with minimal sliding scale-ranges between 160 and 190 with 100% meals eaten resume Lantus  at dose of 18 resume glipizide  as per prior to admission dose We had a discussion about use of basal bolus regimen she has a preference for using Lantus  and glipizide  at this time have cautioned her that she may eventually need to go to basal bolus regimen and she understands this   Fatty liver disease dyslipidemia Needs outpatient characterization further by [facility] on follow-up if needed can repeat LFTs as an outpatient   HTN CKD 3--mild metabolic acidosis Acidosis corrected during hospitalization with treatment of infection and IV fluid   Hypokalemia on admission  Hypokalemia corrected   Discharge Exam: Vitals:   03/16/24 0823 03/16/24 1217  BP: (!) 150/83 118/77  Pulse: 98 92  Resp: 15 15  Temp:  98.6 F (37 C) 98.7 F (37.1 C)  SpO2: 100% 100%    Subj on day of d/c   Coherent awake alert no distress Pain 0/10  General Exam on discharge  EOMI NCAT no focal deficit no icterus or pallor Chest is clear no added sound no wheeze Wound not examined on right Darco/postop boot in place Power is 5/5  Discharge Instructions   Discharge Instructions     Diet - low sodium heart healthy   Complete by: As directed    Discharge instructions   Complete by: As directed    Make sure that you complete all of the antibiotics Lifelong aspirin  Plavix  Lipitor Discussed addition of sliding scale to the long-acting insulin -our recommendation remains to use basal bolus which means 3-4 times a day short acting injections and long-acting insulin  but based on our discussion you want to try the tablets in addition to the long-acting insulin  which is fine temporarily-I will leave the number for your endocrinologist in the chart for you to ensure that you have follow-up-if you are not able to obtain this follow-up please follow-up with your primary physician and discuss these issues Please pick up all of your blood glucose testing supplies from the pharmacy as you told me you had the supplies waiting for you to pick up If you experience redness pain fever chills or oozing from the area on your foot call the podiatrist You will get a work note excusing you from heavy duty and standing for extensive periods of time for at least 2 weeks this can be renewed by your podiatrist please wear your boot and keep your dressing clean as per them until you are seen in their office   Increase activity slowly   Complete by: As directed    Leave dressing on - Keep it clean, dry, and intact until clinic visit   Complete by: As directed       Allergies as of 03/16/2024       Reactions   Amoxicillin Other (See Comments)   Unknown    Shellfish Allergy Other (See Comments)   Unknown         Medication List      STOP taking these medications    Accu-Chek Guide Me w/Device Kit   Accu-Chek Guide Test test strip Generic drug: glucose blood   BD Pen Needle Nano U/F 32G X 4 MM Misc Generic drug: Insulin  Pen Needle   freestyle lancets   FreeStyle Libre 3 Plus Sensor Misc   FreeStyle [place] 3 Reader Devi       TAKE these medications    acetaminophen  325 MG tablet Commonly known as: TYLENOL  Take 2 tablets (650 mg total) by mouth every 4 (four) hours as needed for headache or mild pain (pain score 1-3).   aspirin  EC 81 MG tablet Take 81 mg by mouth daily.   atorvastatin  20 MG tablet Commonly known as: LIPITOR Take 1 tablet (20 mg total) by mouth daily. Start taking on: March 17, 2024   clopidogrel  75 MG tablet Commonly known as: PLAVIX  Take 1 tablet (75 mg total) by mouth daily with breakfast. Start taking on: March 17, 2024   doxycycline  100 MG tablet Commonly known as: VIBRA -TABS Take 1 tablet (100 mg total) by mouth every 12 (twelve) hours for 4 days.   ferrous  sulfate 325 (65 FE) MG tablet Take 1 tablet (325 mg total) by mouth 2 (two) times daily with a meal.   gabapentin  300 MG capsule Commonly known as: NEURONTIN  Take 1 capsule (300 mg total) by mouth 2 (two) times daily. What changed:  medication strength how much to take when to take this   glipiZIDE  5 MG 24 hr tablet Commonly known as: GLUCOTROL  XL Take 1 tablet (5 mg total) by mouth daily with breakfast.   insulin  glargine-yfgn 100 UNIT/ML Pen Commonly known as: SEMGLEE  Inject 20 Units into the skin at bedtime.   oxyCODONE  5 MG immediate release tablet Commonly known as: Oxy IR/ROXICODONE  Take 1 tablet (5 mg total) by mouth every 6 (six) hours as needed for severe pain (pain score 7-10).               Discharge Care Instructions  (From admission, onward)           Start     Ordered   03/16/24 0000  Leave dressing on - Keep it clean, dry, and intact until clinic visit        03/16/24 1409            Allergies  Allergen Reactions   Amoxicillin Other (See Comments)    Unknown    Shellfish Allergy Other (See Comments)    Unknown     Follow-up Information     Wright, [name], FNP Follow up.   Specialty: Family Medicine Contact information: [address] [place] [address] [phone]         [facility] Follow up in 4 week(s).   Specialty: Vascular Surgery Why: Office will call to arrange your appt(s) Contact information: [address] [place]  [address] [phone]        Lenis Ethelle ORN, MD. Go in 1 week(s).   Specialty: Endocrinology Contact information: [address] [place] [address] [phone]                  The results of significant diagnostics from this hospitalization (including imaging, microbiology, ancillary and laboratory) are listed below for reference.    Significant Diagnostic Studies: DG Foot 2 Views Right Result Date: 03/15/2024 CLINICAL DATA:  Postop EXAM: RIGHT FOOT - 2 VIEW COMPARISON:  03/11/2024 . FINDINGS: 2 views of the right foot demonstrate interval amputation of the fifth digit at the level of the proximal phalanx. Mild forefoot soft tissue swelling IMPRESSION: Status post amputation of the fifth digit at the level of the proximal phalanx. Electronically Signed   By: Rockey Kilts M.D.   On: 03/15/2024 09:04   PERIPHERAL VASCULAR CATHETERIZATION Result Date: 03/14/2024 Images from the original result were not included. Patient name: Anne Wright MRN: [id number] DOB: 1976-02-20 Sex: female 03/11/2024 - 03/14/2024 Pre-operative Diagnosis: Right fifth toe ulcer Post-operative diagnosis:  Same Surgeon:  Malvina New Procedure Performed:  1.  Ultrasound-guided access, left femoral artery  2.  Aortobifemoral angiogram  3.  Right leg runoff  4.  Selective injections with the catheter in the right external iliac  artery and right popliteal artery  5.  Mechanical thrombectomy using the Jeddi device of the right superficial femoral artery  6.  Stent, right superficial femoral artery  7.  Conscious sedation, 68 minutes  8.  Closure device, Celt Indications: This is a 48 year old female with a right fifth toe wound.  She has  underlying vascular disease and so angiography is indicated prior to amputation. Procedure:  The patient was identified in the holding area and taken to room 8.  The patient was then placed supine on the table and prepped and draped in the usual sterile fashion.  A time out was called.  Conscious sedation was administered with the use of IV fentanyl  and Versed  under continuous physician and nurse monitoring.  Heart rate, blood pressure, and oxygen saturation were continuously monitored.  Total sedation time was 68 minutes.  Ultrasound was used to evaluate the left common femoral artery.  It was patent .  A digital ultrasound image was acquired.  A micropuncture needle was used to access the left common femoral artery under ultrasound guidance.  An 018 wire was advanced without resistance and a micropuncture sheath was placed.  The 018 wire was removed and a benson wire was placed.  The micropuncture sheath was exchanged for a 5 french sheath.  An omniflush catheter was advanced over the wire to the level of L-1.  An abdominal angiogram was obtained.  Next, using the omniflush catheter and a benson wire, the aortic bifurcation was crossed and the catheter was placed into theright external iliac artery and right runoff was obtained.  Findings:  Aortogram: No significant renal artery stenosis was visualized.  The infrarenal abdominal aorta was widely patent.  Bilateral common and external iliac arteries were widely patent.  Bilateral common femoral arteries were widely patent.  Right Lower Extremity: Right common femoral and profundofemoral artery are widely patent.  The superficial femoral artery was occluded  with reconstitution in the adductor canal.  There is two-vessel runoff via the posterior tibial and peroneal artery  Intervention: Ultrasound images were acquired the decision was made to proceed with intervention.  A 6 x 45 catapult sheath was advanced over the bifurcation into the right external iliac artery and the patient was fully heparinized.  Additional imaging was performed with the catheter at this level to identify the stump of the superficial femoral artery.  Using a 035 Glidewire and a quick cross catheter, subintimal recanalization was performed with reentry into the above-knee popliteal artery, which was confirmed with a selective injection with the catheter in the popliteal artery.  The wire went very quickly suggesting fresh thrombus.  Therefore I elected to treat this initially with mechanical thrombectomy to avoid distal embolization.  A V-18 wire was inserted into the popliteal artery and the quick cross catheter was removed.  I then performed mechanical thrombectomy using the Jeddi device.  2 complete passes down the back were performed.  A versa core wire was then placed.  I elected to primarily stent the artery.  I placed an Eluvia 6 x 150, 6 x 120, and 6 x 40 stent landing at the origin of the superficial femoral artery.  This was postdilated with a 5 mm balloon.  Completion imaging was performed which showed a widely patent superficial femoral-popliteal artery with no change in runoff.  The long sheath was exchanged out for a short 6 French sheath in the left groin was closed with a Celt Impression:  #1  Occluded right superficial femoral artery successfully crossed and stented using overlapping 6 mm Eluvia stents  #2  Patient has two-vessel runoff via the posterior tibial and peroneal artery  #3  She has been maximally revascularized  #4  She will need aspirin , statin, and Plavix  ALONSO Malvina New, M.D., [facility] [place] Office: [phone] Pager:  [phone]   US  ARTERIAL ABI (SCREENING LOWER EXTREMITY) Result Date: 03/13/2024 CLINICAL DATA:  BILATERAL lower extremity pain. Hypertension. Diabetes. RIGHT fifth toe ulcer. EXAM: NONINVASIVE PHYSIOLOGIC VASCULAR STUDY OF BILATERAL LOWER EXTREMITIES TECHNIQUE: Evaluation of both lower extremities were performed at rest, including calculation of ankle-brachial indices with single level pressure measurements and doppler recording. COMPARISON:  None available. FINDINGS: Right ABI:  0.64 Left ABI:  1.02 Right Lower Extremity: Posterior tibial and dorsalis pedis artery waveforms are monophasic. Left Lower Extremity: Dorsalis pedis artery waveform is monophasic. Posterior tibial artery waveform is multiphasic. 0.5-0.79 Moderate PAD IMPRESSION: Decreased RIGHT ankle brachial index indicative of moderate peripheral arterial disease. Electronically Signed   By: Aliene Lloyd M.D.   On: 03/13/2024 13:19   MR FOOT RIGHT WO CONTRAST Result Date: 03/12/2024 CLINICAL DATA:  Osteomyelitis, foot EXAM: MRI OF THE RIGHT FOREFOOT WITHOUT CONTRAST TECHNIQUE: Multiplanar, multisequence MR imaging of the right forefoot was performed. No intravenous contrast was administered. COMPARISON:  Foot radiographs 03/11/2024 FINDINGS: Bones/Joint/Cartilage A marker has been placed over the dorsal aspect of the small toe. There is evidence of soft tissue ulceration along the plantar aspect of the 5th metatarsophalangeal joint, further described below. The 5th metatarsal and 5th proximal phalanx appear normal. The 5th middle and distal phalanges are ankylosed with T2 marrow hyperintensity which persists on the sagittal inversion recovery images. No definite T1 signal abnormality or cortical destruction. The additional digits appear unremarkable. There are no significant joint effusions. No significant arthropathic changes in the forefoot or midfoot. Ligaments Intact Lisfranc ligament. Intact collateral ligaments of the metatarsophalangeal  joints. Muscles and Tendons Unremarkable. No significant tenosynovitis or focal muscular atrophy. Soft tissues As above, apparent soft tissue ulceration along the plantar aspect of the 5th metatarsophalangeal joint with associated skin thickening. No focal fluid collection, foreign body or soft tissue emphysema identified. The soft tissue changes are proximal to the marrow changes distally in the small toe, described above. Generalized dorsal subcutaneous edema noted without focal fluid collection. Small amount of intermetatarsal bursal fluid in the 1st web space. IMPRESSION: 1. Apparent soft tissue ulceration along the plantar aspect of the 5th metatarsophalangeal joint with associated skin thickening. No focal fluid collection, foreign body or soft tissue emphysema identified. 2. The 5th middle and distal phalanges are ankylosed with T2 marrow hyperintensity which persists on the sagittal inversion recovery images. No definite T1 signal abnormality or cortical destruction. These marrow changes appear removed from the described more proximal soft tissue findings. Findings are nonspecific and could be reactive or secondary to early osteomyelitis. Correlate with area of clinical concern. 3. No evidence of septic arthritis or tenosynovitis. 4. Nonspecific dorsal subcutaneous edema. Electronically Signed   By: Elsie Perone M.D.   On: 03/12/2024 08:31   DG Foot Complete Right Result Date: 03/11/2024 CLINICAL DATA:  Possible infected ulceration. EXAM: RIGHT FOOT COMPLETE - 3+ VIEW COMPARISON:  None Available. FINDINGS: There is no evidence of fracture or dislocation. There is no evidence of arthropathy or other focal bone abnormality. There is soft tissue swelling lateral to the fifth metatarsophalangeal joint. No evidence for foreign body. IMPRESSION: Soft tissue swelling lateral to the fifth metatarsophalangeal joint. No evidence for foreign body or acute osseous abnormality. Electronically Signed   By: Greig Pique M.D.   On: 03/11/2024 17:22    Microbiology: Recent Results (from the past 240 hours)  Blood Cultures x 2 sites     Status: None (Preliminary result)   Collection Time: 03/12/24  1:37 AM  Specimen: BLOOD  Result Value Ref Range Status   Specimen Description BLOOD BLOOD LEFT ARM  Final   Special Requests   Final    BOTTLES DRAWN AEROBIC AND ANAEROBIC Blood Culture adequate volume   Culture   Final    NO GROWTH 4 DAYS Performed at [facility], [address], [place], [place] [address]    Report Status PENDING  Incomplete  Blood Cultures x 2 sites     Status: None (Preliminary result)   Collection Time: 03/12/24  1:39 AM   Specimen: BLOOD  Result Value Ref Range Status   Specimen Description BLOOD BLOOD LEFT HAND  Final   Special Requests   Final    BOTTLES DRAWN AEROBIC AND ANAEROBIC Blood Culture adequate volume   Culture   Final    NO GROWTH 4 DAYS Performed at [facility], [address], [place], [place] [address]    Report Status PENDING  Incomplete  Surgical PCR screen     Status: None   Collection Time: 03/14/24  6:37 PM   Specimen: Nasal Mucosa; Nasal Swab  Result Value Ref Range Status   MRSA, PCR NEGATIVE NEGATIVE Final   Staphylococcus aureus NEGATIVE NEGATIVE Final    Comment: (NOTE) The Xpert SA Assay (FDA approved for NASAL specimens in patients 4 years of age and older), is one component of a comprehensive surveillance program. It is not intended to diagnose infection nor to guide or monitor treatment. Performed at [facility], [address], [place], [place] [address]   Aerobic/Anaerobic Culture w Gram Stain (surgical/deep wound)     Status: None (Preliminary result)   Collection Time: 03/15/24  7:57 AM   Specimen: Path Tissue  Result Value Ref Range Status   Specimen Description TISSUE  Final   Special Requests NONE  Final   Gram Stain   Final    RARE WBC PRESENT, PREDOMINANTLY PMN RARE GRAM POSITIVE COCCI    Culture   Final     RARE STAPHYLOCOCCUS AUREUS CULTURE REINCUBATED FOR BETTER GROWTH Performed at [facility], [address] [place], [place] [address]    Report Status PENDING  Incomplete     Labs: Basic Metabolic Panel: Recent Labs  Lab 03/12/24 0139 03/12/24 2308 03/13/24 0438 03/14/24 0605 03/15/24 0319 03/16/24 0313  NA 136  --  139 135 133* 137  K 4.1  --  3.2* 4.2 3.9 4.6  CL 103  --  110 113* 110 109  CO2 21*  --  22 19* 20* 23  GLUCOSE 342* 538* 134* 381* 255* 194*  BUN 23*  --  18 15 18 17   CREATININE 0.98  --  0.93 1.07* 1.17* 1.16*  CALCIUM  8.2*  --  8.0* 7.9* 7.7* 8.2*  MG  --   --  1.7  --  1.5*  --   PHOS  --   --   --  2.2*  --   --    Liver Function Tests: Recent Labs  Lab 03/11/24 1622 03/13/24 0438 03/14/24 0605  AST 29 19  --   ALT 24 18  --   ALKPHOS 77 80  --   BILITOT 0.7 0.2  --   PROT 7.4 5.4*  --   ALBUMIN 3.7 2.5* 2.1*   No results for input(s): LIPASE, AMYLASE in the last 168 hours. No results for input(s): AMMONIA in the last 168 hours. CBC: Recent Labs  Lab 03/11/24 1622 03/12/24 0139 03/13/24 0438 03/15/24 0319 03/16/24  0313  WBC 8.8 8.2 7.0 8.0 7.3  NEUTROABS 5.5  --   --   --   --   HGB 13.4 11.1* 10.5* 8.7* 8.1*  HCT 40.6 34.3* 31.0* 26.8* 24.6*  MCV 94.9 94.8 95.7 95.7 96.1  PLT 326 296 266 251 236   Cardiac Enzymes: No results for input(s): CKTOTAL, CKMB, CKMBINDEX, TROPONINI in the last 168 hours. BNP: BNP (last 3 results) No results for input(s): BNP in the last 8760 hours.  ProBNP (last 3 results) No results for input(s): PROBNP in the last 8760 hours.  CBG: Recent Labs  Lab 03/15/24 0840 03/15/24 1121 03/15/24 1616 03/16/24 0820 03/16/24 1214  GLUCAP 181* 304* 267* 174* 164*    Signed:  Colen Grimes MD   [facility] 03/16/2024, 2:09 PM

## 2024-03-16 NOTE — Progress Notes (Signed)
  Subjective:  Patient ID: Anne Wright, female    DOB: [birth year],  MRN: [id number]  A 48 y.o. female presents with gangrene of right fifth digit status post right fifth digit amputation.  She states she is doing well pain is controlled.  Ambulating with surgical shoe ready for discharge today  Objective:   Vitals:   03/16/24 0823 03/16/24 1217  BP: (!) 150/83 118/77  Pulse: 98 92  Resp: 15 15  Temp: 98.6 F (37 C) 98.7 F (37.1 C)  SpO2: 100% 100%   General AA&O x3. Normal mood and affect.  Vascular Dorsalis pedis and posterior tibial pulses 2/4 bilat. Brisk capillary refill to all digits. Pedal hair present.  Neurologic Epicritic sensation grossly intact.  Dermatologic Bandages clean dry and intact no strikethrough noted.  No calf pain noted motor or sensory functions are intact  Orthopedic: MMT 5/5 in dorsiflexion, plantarflexion, inversion, and eversion. Normal joint ROM without pain or crepitus.    Assessment & Plan:  Patient was evaluated and treated and all questions answered.  Right fifth digit gangrene status post postop day 1 of fifth digit amputation - All questions and concerns were discussed with the patient in extensive detail - Patient is okay to be discharged from podiatric standpoint - Bandages clean dry and intact. - Should be scheduled to see podiatry in our clinic 1 week from discharge - Weightbearing as tolerated in surgical shoe - Patient will benefit from doxycycline  for 5 days.  Franky SHAUNNA Blanch, DPM  Accessible via secure chat for questions or concerns.

## 2024-03-16 NOTE — TOC CM/SW Note (Signed)
 Message received from RN stating that pt doesn't have medical insurance and can't afford the insulin . Met with pt at bedside. Pt reports that she has insurance but is not a good one. She reports that she is switching jobs and she is getting new insurance through [employer]. Discussed with pt calling the business office on Monday to add her medical insurance information. Pt currently has [employer], policy # [id number]. Pt reports that she has a PCP. She reports that she stopped taking her insulin  because she couldn't afford the co-payment. Discussed with pt the importance of taking her meds as prescribed. She verbalized understanding. Provided pt with [employer] coupons for the insulin .

## 2024-03-16 NOTE — Progress Notes (Signed)
 Patient discharged to home, AVS reviewed and all questions answered. Printed prescriptions provided. Staff assisted patient to the exit, family provided transportation.

## 2024-03-17 LAB — CULTURE, BLOOD (ROUTINE X 2)
Culture: NO GROWTH
Culture: NO GROWTH
Special Requests: ADEQUATE
Special Requests: ADEQUATE

## 2024-03-18 NOTE — Transitions of Care (Post Inpatient/ED Visit) (Signed)
 03/18/2024  Patient ID: Anne Wright, female   DOB: 13-Sep-1975, 48 y.o.   MRN: [id number].  TOC RN CM post discharge outreach chart review for potential follow up/enrollment in 30 day program. It was noted in chart review for the outreach that patient status was changed to place in observation on  03/12/24, same day of admission.   Bing Edison MSN, RN RN [employer] [facility]  [facility] Office Hours M-F [phone] Direct Dial: [phone] Main Phone [phone]  Fax: [phone] [email or web address]

## 2024-03-19 LAB — SURGICAL PATHOLOGY

## 2024-03-20 LAB — AEROBIC/ANAEROBIC CULTURE W GRAM STAIN (SURGICAL/DEEP WOUND)

## 2024-03-21 ENCOUNTER — Other Ambulatory Visit (HOSPITAL_COMMUNITY): Payer: Self-pay

## 2024-03-21 ENCOUNTER — Other Ambulatory Visit: Payer: Self-pay | Admitting: Surgery

## 2024-03-21 ENCOUNTER — Ambulatory Visit (INDEPENDENT_AMBULATORY_CARE_PROVIDER_SITE_OTHER): Payer: Self-pay | Admitting: Podiatry

## 2024-03-21 ENCOUNTER — Encounter: Payer: Self-pay | Admitting: Podiatry

## 2024-03-21 DIAGNOSIS — Z89421 Acquired absence of other right toe(s): Secondary | ICD-10-CM

## 2024-03-21 DIAGNOSIS — I96 Gangrene, not elsewhere classified: Secondary | ICD-10-CM

## 2024-03-21 DIAGNOSIS — I739 Peripheral vascular disease, unspecified: Secondary | ICD-10-CM

## 2024-03-21 MED ORDER — DOXYCYCLINE HYCLATE 100 MG PO TABS
100.0000 mg | ORAL_TABLET | Freq: Two times a day (BID) | ORAL | 0 refills | Status: DC
  Filled 2024-03-21: qty 14, 7d supply, fill #0

## 2024-03-21 MED ORDER — DOXYCYCLINE HYCLATE 100 MG PO TABS: 100.0000 mg | ORAL_TABLET | Freq: Two times a day (BID) | ORAL | 0 refills | Status: AC

## 2024-03-21 NOTE — Progress Notes (Signed)
 Subjective: Chief Complaint  Patient presents with   Routine Post Op    POV 1 s/p R 5th toe amputation. 0 pain. IDDM A1C 12.6. Wearing surgical shoe.   48 year old female presents the office today above concerns.  States that she is doing well does not have any pain to her foot.  She has some pain along the foot where she had her vascular surgery but otherwise no pain in the foot.  She completed her course of antibiotics yesterday.  Surgery: Right 5th toe amputation  DOS: 03/15/2024 Surgeon: Dr. Marolyn Honour  Path: A. TOE, RIGHT FIFTH, AMPUTATION:  - Benign bone with mild chronic inflammation  - Benign skin with acute inflammation and ulceration  - Margin appears viable   Objective: AAO x3, NAD DP/PT pulses palpable bilaterally, CRT less than 3 seconds Incision with sutures intact.  There is 1 small area granulation tissue which is very superficial on the incision.  There is no drainage or pus or any bleeding today.  There is edema to the foot there is no increase in warmth.  No fluctuation or crepitation.  No pain on exam. No pain with calf compression, swelling, warmth, erythema       Assessment: Status post right 5th toe amputation  Plan: -All treatment options discussed with the patient including all alternatives, risks, complications.  -Xeroform applied today followed by dressing.  Discussed that she can keep dressing clean, dry, intact.  If needed she can change the bandage and I showed her how to do this.   -Remain in surgical shoe, elevation -Wound continue antibiotics.  Refill doxycycline  today. -She is asking for pain medicine due to the pain in the thigh which she believes is from the stents. Recommended to follow-up with vascular surgery for this. -Patient encouraged to call the office with any questions, concerns, change in symptoms.   Return in about 1 week (around 03/28/2024) for toe amputation with Dr. Honour.   Anne Wright Fees DPM

## 2024-03-25 ENCOUNTER — Telehealth: Payer: Self-pay | Admitting: "Endocrinology

## 2024-03-25 ENCOUNTER — Ambulatory Visit: Admitting: "Endocrinology

## 2024-03-25 DIAGNOSIS — E1165 Type 2 diabetes mellitus with hyperglycemia: Secondary | ICD-10-CM

## 2024-03-25 NOTE — Telephone Encounter (Signed)
 Pt states that pharmacy was charging $200 for herlene, will need a regular meter kit called into [facility] in [place]

## 2024-03-28 ENCOUNTER — Encounter: Payer: Self-pay | Admitting: Podiatry

## 2024-03-28 ENCOUNTER — Inpatient Hospital Stay: Payer: Self-pay | Admitting: Nurse Practitioner

## 2024-03-28 ENCOUNTER — Ambulatory Visit (INDEPENDENT_AMBULATORY_CARE_PROVIDER_SITE_OTHER): Payer: Self-pay | Admitting: Podiatry

## 2024-03-28 DIAGNOSIS — Z9889 Other specified postprocedural states: Secondary | ICD-10-CM

## 2024-03-28 DIAGNOSIS — I96 Gangrene, not elsewhere classified: Secondary | ICD-10-CM

## 2024-03-28 MED ORDER — ACCU-CHEK SOFTCLIX LANCETS MISC: 2 refills | Status: AC

## 2024-03-28 MED ORDER — ACCU-CHEK GUIDE TEST VI STRP: ORAL_STRIP | 1 refills | Status: DC

## 2024-03-28 MED ORDER — ACCU-CHEK GUIDE ME W/DEVICE KIT: 1.0000 | PACK | 0 refills | Status: DC

## 2024-03-28 NOTE — Progress Notes (Signed)
  Subjective:  Patient ID: Anne Wright, female    DOB: 01-Aug-1976,  MRN: [id number]  Chief Complaint  Patient presents with   Wound Check     R 5th toe amputation. IDDM A1C 12.6. 0 pain. Taking Doxycycline . Wearing surgical shoe.    DOS: 03/15/2024 Procedure: Amputation of fifth toe at MPJ level, right foot  48 y.o. female seen for post op check.  Patient seen for postop check approximately 2 weeks after above procedure.  Walking in postop shoe.  Currently taking doxycycline .  Says the drainage has stopped  Review of Systems: Negative except as noted in the HPI. Denies N/V/F/Ch.   Objective:   Constitutional Well developed. Well nourished.  Vascular Foot warm and well perfused. Capillary refill normal to all digits.   No calf pain with palpation  Neurologic Normal speech. Oriented to person, place, and time. Epicritic sensation diminished  Dermatologic Amputation site right fifth MPJ healing very nicely there is some hyperkeratotic skin but overall very healthy no evidence of necrosis or dehiscence no drainage.   Orthopedic: Status post right fifth toe amputation MPJ level   Radiographs: Status post amputation of the fifth digit at the level of the proximal phalanx  Pathology: A. TOE, RIGHT FIFTH, AMPUTATION:  - Benign bone with mild chronic inflammation  - Benign skin with acute inflammation and ulceration  - Margin appears viable   Micro: MSSA from right fifth toe tissue culture  Assessment:   1. Gangrene of toe of right foot (HCC)   2. Post-operative state   Gangrene of right fifth toe status post amputation  Plan:  Patient was evaluated and treated and all questions answered.  2 weeks s/p right fifth toe amputation at MPJ level -Progressing as expected postoperatively, amputation site healing very well improved from prior -XR: Expected postoperative changes -WB Status: Weightbearing as tolerated in postop shoe -Sutures: Removed today. -Medications/ABX:  Monitor off antibiotics -Dressing: Okay to wash the right foot with warm soapy water and then apply lotion as needed.  Over the incision I recommend a small amount of antibiotic ointment and a Band-Aid for another week or so and then just go to a regular sock - F/u Plan: 4 weeks for final recheck        Anne Wright, DPM [facility]

## 2024-03-28 NOTE — Telephone Encounter (Signed)
 Rx for Accu-chek Guide meter, test strips and lancets sent [facility] in [place].

## 2024-03-29 ENCOUNTER — Telehealth: Payer: Self-pay | Admitting: Family Medicine

## 2024-03-29 NOTE — Telephone Encounter (Signed)
 FYI, called the patient to reschedule her hospital follow up 2x's left message both times, see below message.

## 2024-04-15 ENCOUNTER — Ambulatory Visit: Payer: Self-pay | Admitting: *Deleted

## 2024-04-15 ENCOUNTER — Telehealth: Payer: Self-pay

## 2024-04-15 ENCOUNTER — Ambulatory Visit: Payer: Self-pay

## 2024-04-15 NOTE — Telephone Encounter (Addendum)
 Pt called c/o bilateral leg swelling and pain that improves after resting and elevating. Pt reports the temperature of both lower extremities is normal. Pt knows to elevate and use mild compression. Pt knows to keep her US  appts on 04/22/24 and post op appt on 04/24/24.  Pt knows to call back or go to the ED for signs of infection and/or cold lower extremity.

## 2024-04-15 NOTE — Telephone Encounter (Signed)
 Patient scheduled.

## 2024-04-15 NOTE — Telephone Encounter (Signed)
 Copied from CRM [id number]. Topic: Clinical - Red Word Triage >> Apr 15, 2024 12:28 PM Cleave MATSU wrote: Red Word that prompted transfer to Nurse Triage: pt needs follow up appt. she had pinky toe amputated and now her legs are swelling. Reason for Disposition  [1] MODERATE leg swelling (e.g., swelling extends up to knees) AND [2] new-onset or getting worse  Answer Assessment - Initial Assessment Questions 1. ONSET: When did the swelling start? (e.g., minutes, hours, days)     I'm having swelling in both legs.   My right pinky toe has been removed on right foot.   The surgery went fine.    I don't want to see [name] [name], FNP.     I need a follow up appt.   My dr had called out sick.    2. LOCATION: What part of the leg is swollen?  Are both legs swollen or just one leg?     Both legs above my knees. 3. SEVERITY: How bad is the swelling? (e.g., localized; mild, moderate, severe)     Swollen into my thighs.  My ankles are not visible.    4. REDNESS: Is there redness or signs of infection?     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    5. PAIN: Is the swelling painful to touch? If Yes, ask: How painful is it?   (Scale 1-10; mild, moderate or severe)     No pain.   When I elevate them the swelling goes down. 6. FEVER: Do you have a fever? If Yes, ask: What is it, how was it measured, and when did it start?      No 7. CAUSE: What do you think is causing the leg swelling?     I'm not sure other than being on my feet all the time on my job.   8. MEDICAL HISTORY: Do you have a history of blood clots (e.g., DVT), cancer, heart failure, kidney disease, or liver failure?     No 9. RECURRENT SYMPTOM: Have you had leg swelling before? If Yes, ask: When was the last time? What happened that time?     This is new for me. 10. OTHER SYMPTOMS: Do you have any other symptoms? (e.g., chest pain, difficulty breathing)       A little shortness of breath.   It's happened 2-3  times.   11. PREGNANCY: Is there any chance you are pregnant? When was your last menstrual period?       Not asked  Protocols used: Leg Swelling and Edema-A-AH Pt needs hospital follow up visit.   Had pinky toe removed on right foot on 03/15/2024.  Had hospital f/u on 03/28/2024 scheduled with Glennon Sand but provider cancelled.   Pt calling back in for follow up appt.    Does not want to see [name] [name], FNP.   Fine with any other provider.    Hospital follow up appt needed         FYI Only or Action Required?: Action required by provider: request for appointment.  Patient was last seen in primary care on 01/29/2024 by [name], [name], FNP.Does not want to See [name] [name], FNP.  Called Nurse Triage reporting Leg Swelling.Bilateral leg swelling above knees.  No shortness of breath or chest tightness  Symptoms began about a month ago. 03/15/2024 surgical removal of pinky toe on right foot.   Hospital follow up needed.     Interventions attempted: Other: post op  surgical instructions.   Also keeping legs elevated.  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  Both legs are swollen above he knees.  Triage Disposition: See Physician Within 24 Hours No appts available within timeframe of hospital follow up.   Message sent for scheduling.  Patient/caregiver understands and will follow disposition?: Yes

## 2024-04-15 NOTE — Telephone Encounter (Signed)
 FYI Only or Action Required?: Action required by provider: Scheduled earliest available at other office per pt request, needs further recommendations in meantime.  Patient was last seen in primary care on 01/29/2024 by [name], [name], FNP.  Called Nurse Triage reporting Leg Swelling; recent amputation to right toe; Dizziness; and 2-3 episodes in past month of SOB, heart racing, breathing fast, panic attack?.  Symptoms began about a month ago.  Interventions attempted: Rest, hydration, or home remedies and Other: elevation of legs.  Symptoms are: ebbs and flows.  Triage Disposition: See HCP Within 4 Hours (Or PCP Triage)  Patient/caregiver understands and will follow disposition?: No, refuses disposition      Copied from CRM [id number]. Topic: Clinical - Red Word Triage >> Apr 15, 2024  2:02 PM Anne Wright wrote: Red Word that prompted transfer to Nurse Triage: Swelling in both of her legs, just had right pinky toe amputated a month ago. Reason for Disposition  [1] Thigh, calf, or ankle swelling AND [2] bilateral AND [3] 1 side is more swollen  Answer Assessment - Initial Assessment Questions Advised pt go to ED especially if any worsening of symptoms, advised pt call surgeon right away to inform and see about further recommendations. Pt requesting acute appt with other office since refusing to see [name] NP, scheduled with earliest pt would accept with personal schedule for 9/17 at [facility]. Advised pt go to ED or UC in meantime for more immediate care, pt refusing at this time due to schedule, advised ED for any worsening/new symptoms.    1. ONSET: When did the swelling start? 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 LOCATION: What part of the leg is swollen?  Are both legs swollen or just one  leg?     To knees, knees are also swollen Right leg is usually more swollen than other 3. SEVERITY: How bad is the swelling? (e.g., localized; mild, moderate, severe)     Pitting, indentation from chair lasted 4 min last night 4. REDNESS: Is there redness or signs of infection?     No sign of infection, some discoloration from where wear shoes 5. PAIN: Is the swelling painful to touch? If Yes, ask: How painful is it?   (Scale 1-10; mild, moderate or severe)     Not painful Legs just feel kind of tight 6. FEVER: Do you have a fever? If Yes, ask: What is it, how was it measured, and when did it start?      no 8. MEDICAL HISTORY: Do you have a history of blood clots (e.g., DVT), cancer, heart failure, kidney disease, or liver failure?     No hx blood clots 9. RECURRENT SYMPTOM: Have you had leg swelling before? If Yes, ask: When was the last time? What happened that time?     No hx leg swelling before surgery 10. OTHER SYMPTOMS: Do you have any other symptoms? (e.g., chest pain, difficulty breathing)       SOB comes and goes, confused if SOB or panic attack from everything going on, only happened 2-3x since surgery, feels like heart beating fast during that time, have to go outside most time to calm down, random, most of time at night lying down, and like breathing fast, just a little bit SOB,  have to breathe through mouth to get back to where need to be, SOB has pretty much stayed the same each time regardless of exertion No headache or flutters in chest Some dizziness today Sometimes BP will get low and get a little dizzy  Not wanting to see [name] NP Part of reason I got amputation  Nurse canceled 10/2 [name] NP appt per pt request Pt confirms she had been previously scheduled for HFU with another provider at PCP office but provider had canceled because out of office Earliest available with PCP office 10/1, pt wanting earlier appt  Protocols used: Leg Swelling  and Edema-A-AH

## 2024-04-17 ENCOUNTER — Ambulatory Visit: Payer: Self-pay | Admitting: Family Medicine

## 2024-04-17 ENCOUNTER — Telehealth: Payer: Self-pay

## 2024-04-17 NOTE — Telephone Encounter (Signed)
 Copied from CRM [id number]. Topic: Appointments - Appointment Cancel/Reschedule >> Apr 17, 2024  8:22 AM Charlet HERO wrote: Patient/patient representative is calling to cancel  an appointment. Refer to attachments for appointment information.

## 2024-04-22 ENCOUNTER — Ambulatory Visit (HOSPITAL_BASED_OUTPATIENT_CLINIC_OR_DEPARTMENT_OTHER)
Admit: 2024-04-22 | Discharge: 2024-04-22 | Disposition: A | Discharge status: Home / Self Care | Payer: Self-pay | Attending: Surgery | Admitting: Surgery

## 2024-04-22 ENCOUNTER — Ambulatory Visit (HOSPITAL_COMMUNITY)
Admission: RE | Admit: 2024-04-22 | Discharge: 2024-04-22 | Disposition: A | Discharge status: Home / Self Care | Payer: Self-pay | Source: Ambulatory Visit | Attending: Surgery | Admitting: Surgery

## 2024-04-22 DIAGNOSIS — I739 Peripheral vascular disease, unspecified: Secondary | ICD-10-CM

## 2024-04-22 LAB — VAS US ABI WITH/WO TBI
Left ABI: 0.98
Right ABI: 1.05

## 2024-04-24 ENCOUNTER — Ambulatory Visit: Payer: Self-pay | Attending: Vascular Surgery | Admitting: Physician Assistant

## 2024-04-24 VITALS — BP 168/94 | HR 84 | Temp 97.9°F | Ht 62.0 in | Wt 143.2 lb

## 2024-04-24 DIAGNOSIS — I739 Peripheral vascular disease, unspecified: Secondary | ICD-10-CM | POA: Diagnosis not present

## 2024-04-24 NOTE — Progress Notes (Signed)
 Office Note   History of Present Illness   Anne Wright is a 48 y.o. (Jul 07, 1976) female who presents for follow-up.  She recently underwent right lower extremity angiogram with right SFA mechanical thrombectomy and stenting on 03/14/2024 by Dr. Serene.  This was done for critical limb ischemia with a right fifth toe wound.  She subsequently underwent right fifth toe amputation by Dr. Malvin on 03/15/2024.  She returns today for follow-up.  She is doing well at today's visit.  Her fifth toe amputation site is healing very well.  She denies any rest pain or further tissue loss.  She has been taking her aspirin , Plavix , and statin.  Current Outpatient Medications  Medication Sig Dispense Refill   Accu-Chek Softclix Lancets lancets USE TO CHECK BLOOD GLUCOSE FOUR TIMES DAILY AS INSTRUCTED. 200 each 2   acetaminophen  (TYLENOL ) 325 MG tablet Take 2 tablets (650 mg total) by mouth every 4 (four) hours as needed for headache or mild pain (pain score 1-3).     aspirin  EC 81 MG tablet Take 81 mg by mouth daily.     atorvastatin  (LIPITOR) 20 MG tablet Take 1 tablet (20 mg total) by mouth daily. 30 tablet 12   Blood Glucose Monitoring Suppl (ACCU-CHEK GUIDE ME) w/Device KIT 1 each by Does not apply route as directed. USE TO CHECK BLOOD GLUCOSE FOUR TIMES DAILY AS INSTRUCTED. 1 kit 0   clopidogrel  (PLAVIX ) 75 MG tablet Take 1 tablet (75 mg total) by mouth daily with breakfast. 30 tablet 12   doxycycline  (VIBRA -TABS) 100 MG tablet Take 1 tablet (100 mg total) by mouth 2 (two) times daily. 20 tablet 0   ferrous sulfate  325 (65 FE) MG tablet Take 1 tablet (325 mg total) by mouth 2 (two) times daily with a meal. 60 tablet 3   gabapentin  (NEURONTIN ) 300 MG capsule Take 1 capsule (300 mg total) by mouth 2 (two) times daily. 60 capsule 0   glipiZIDE  (GLUCOTROL  XL) 5 MG 24 hr tablet Take 1 tablet (5 mg total) by mouth daily with breakfast. 90 tablet 1   glucose blood (ACCU-CHEK GUIDE TEST) test strip  USE TO CHECK BLOOD GLUCOSE FOUR TIMES DAILY AS INSTRUCTED 400 each 1   insulin  glargine-yfgn (SEMGLEE ) 100 UNIT/ML Pen Inject 20 Units into the skin at bedtime. 15 mL 1   oxyCODONE  (OXY IR/ROXICODONE ) 5 MG immediate release tablet Take 1 tablet (5 mg total) by mouth every 6 (six) hours as needed for severe pain (pain score 7-10). (Patient not taking: Reported on 04/24/2024) 20 tablet 0   No current facility-administered medications for this visit.    REVIEW OF SYSTEMS (negative unless checked):   Cardiac:  []  Chest pain or chest pressure? []  Shortness of breath upon activity? []  Shortness of breath when lying flat? []  Irregular heart rhythm?  Vascular:  []  Pain in calf, thigh, or hip brought on by walking? []  Pain in feet at night that wakes you up from your sleep? []  Blood clot in your veins? []  Leg swelling?  Pulmonary:  []  Oxygen at home? []  Productive cough? []  Wheezing?  Neurologic:  []  Sudden weakness in arms or legs? []  Sudden numbness in arms or legs? []  Sudden onset of difficult speaking or slurred speech? []  Temporary loss of vision in one eye? []  Problems with dizziness?  Gastrointestinal:  []  Blood in stool? []  Vomited blood?  Genitourinary:  []  Burning when urinating? []  Blood in urine?  Psychiatric:  []  Major depression  Hematologic:  []   Bleeding problems? []  Problems with blood clotting?  Dermatologic:  []  Rashes or ulcers?  Constitutional:  []  Fever or chills?  Ear/Nose/Throat:  []  Change in hearing? []  Nose bleeds? []  Sore throat?  Musculoskeletal:  []  Back pain? []  Joint pain? []  Muscle pain?   Physical Examination   Vitals:   04/24/24 0906  BP: (!) 168/94  Pulse: 84  Temp: 97.9 F (36.6 C)  TempSrc: Temporal  Weight: 143 lb 3.2 oz (65 kg)  Height: 5' 2 (1.575 m)   Body mass index is 26.19 kg/m.  General:  WDWN in NAD; vital signs documented above Gait: Not observed HENT: WNL, normocephalic Pulmonary: normal  non-labored breathing , without rales, rhonchi,  wheezing Cardiac: Regular Abdomen: soft, NT, no masses Skin: without rashes Vascular Exam/Pulses: Palpable right femoral pulse.  Palpable right PT pulse Extremities: Nearly healed right fifth toe amputation without signs of infection Musculoskeletal: no muscle wasting or atrophy  Neurologic: A&O X 3;  No focal weakness or paresthesias are detected Psychiatric:  The pt has Normal affect.  Non-Invasive Vascular imaging   ABI (04/22/2024) R:  ABI: 1.05  PT: tri DP: mono TBI: 0.65 L:  ABI: 0.98  PT: bi DP: mono TBI: 0.73   RLE arterial Duplex (04/22/2024) Patent right SFA stent without stenosis  Medical Decision Making   Anne Wright is a 48 y.o. female who presents for follow-up  Based on the patient's vascular studies, her ABIs on the right are 1.05 and on the left are 0.98 Arterial duplex of the right lower extremity demonstrates a patent native system without hemodynamically significant stenosis and a patent right SFA stent without stenosis Her right fifth toe amputation site is nearly healed without signs of infection.  She denies any rest pain or further tissue loss.  On exam she has a palpable right PT pulse She can discontinue her Plavix .  I have encouraged her to continue her aspirin  and statin.  She can follow-up with our office in 6 months with repeat ABIs and right lower extremity arterial duplex  Ahmed Holster PA-C [facility] Office: [phone]  Clinic MD: Sheree

## 2024-04-25 ENCOUNTER — Ambulatory Visit: Admitting: "Endocrinology

## 2024-04-25 ENCOUNTER — Other Ambulatory Visit: Payer: Self-pay | Admitting: *Deleted

## 2024-04-25 DIAGNOSIS — I739 Peripheral vascular disease, unspecified: Secondary | ICD-10-CM

## 2024-05-02 ENCOUNTER — Ambulatory Visit (INDEPENDENT_AMBULATORY_CARE_PROVIDER_SITE_OTHER): Payer: Worker's Compensation | Admitting: Podiatry

## 2024-05-02 ENCOUNTER — Ambulatory Visit: Admitting: Family Medicine

## 2024-05-02 DIAGNOSIS — Z91199 Patient's noncompliance with other medical treatment and regimen due to unspecified reason: Secondary | ICD-10-CM

## 2024-05-02 NOTE — Progress Notes (Signed)
 NS

## 2024-05-22 ENCOUNTER — Ambulatory Visit: Admitting: "Endocrinology

## 2024-05-30 ENCOUNTER — Ambulatory Visit: Payer: Worker's Compensation | Admitting: Podiatry

## 2024-06-13 ENCOUNTER — Ambulatory Visit: Admitting: Podiatry

## 2024-06-20 ENCOUNTER — Ambulatory Visit: Admitting: Podiatry

## 2024-07-03 ENCOUNTER — Ambulatory Visit: Admitting: "Endocrinology

## 2024-07-03 ENCOUNTER — Encounter: Payer: Self-pay | Admitting: "Endocrinology

## 2024-07-03 VITALS — BP 140/80 | HR 92 | Resp 18 | Ht 62.0 in | Wt 139.0 lb

## 2024-07-03 DIAGNOSIS — E782 Mixed hyperlipidemia: Secondary | ICD-10-CM | POA: Diagnosis not present

## 2024-07-03 DIAGNOSIS — Z794 Long term (current) use of insulin: Secondary | ICD-10-CM | POA: Diagnosis not present

## 2024-07-03 DIAGNOSIS — E1165 Type 2 diabetes mellitus with hyperglycemia: Secondary | ICD-10-CM

## 2024-07-03 DIAGNOSIS — Z7984 Long term (current) use of oral hypoglycemic drugs: Secondary | ICD-10-CM | POA: Diagnosis not present

## 2024-07-03 DIAGNOSIS — I10 Essential (primary) hypertension: Secondary | ICD-10-CM | POA: Diagnosis not present

## 2024-07-03 LAB — POCT GLYCOSYLATED HEMOGLOBIN (HGB A1C): Hemoglobin A1C: 9.6 % — AB (ref 4.0–5.6)

## 2024-07-03 MED ORDER — INSULIN GLARGINE-YFGN 100 UNIT/ML ~~LOC~~ SOPN: 20.0000 [IU] | PEN_INJECTOR | Freq: Every day | SUBCUTANEOUS | 1 refills | Status: AC

## 2024-07-03 MED ORDER — ACCU-CHEK GUIDE ME W/DEVICE KIT: 1.0000 | PACK | 0 refills | Status: AC

## 2024-07-03 MED ORDER — ACCU-CHEK GUIDE TEST VI STRP: ORAL_STRIP | 1 refills | Status: AC

## 2024-07-03 NOTE — Patient Instructions (Signed)

## 2024-07-03 NOTE — Progress Notes (Signed)
 07/03/2024, 2:17 PM  Endocrinology follow-up note  Subjective:    Patient ID: Anne Wright, female    DOB: 1975-11-22.  Jelicia Talerico is here for follow-up after she was seen in consultation for management of currently uncontrolled symptomatic diabetes prior to November 2023.    Original consult was requested by Edman Meade PEDLAR, FNP.   Past Medical History:  Diagnosis Date   Diabetes mellitus without complication (HCC)    High cholesterol    Hypertension     Past Surgical History:  Procedure Laterality Date   AMPUTATION Right 03/15/2024   Procedure: AMPUTATION, FOOT, RAY;  Surgeon: Malvin Marsa FALCON, DPM;  Location: [facility] OR;  Service: Orthopedics/Podiatry;  Laterality: Right;  RIGHT 5TH TOE AMPUTATION   LOWER EXTREMITY ANGIOGRAPHY N/A 03/14/2024   Procedure: Lower Extremity Angiography;  Surgeon: Serene Gaile ORN, MD;  Location: [facility];  Service: Cardiovascular;  Laterality: N/A;   LOWER EXTREMITY INTERVENTION Right 03/14/2024   Procedure: LOWER EXTREMITY INTERVENTION;  Surgeon: Serene Gaile ORN, MD;  Location: [facility];  Service: Cardiovascular;  Laterality: Right;   PERIPHERAL VASCULAR THROMBECTOMY Right 03/14/2024   Procedure: PERIPHERAL VASCULAR THROMBECTOMY;  Surgeon: Serene Gaile ORN, MD;  Location: [facility];  Service: Cardiovascular;  Laterality: Right;    Social History   Socioeconomic History   Marital status: Single    Spouse name: Not on file   Number of children: 2   Years of education: Not on file   Highest education level: Not on file  Occupational History   Not on file  Tobacco Use   Smoking status: Former    Current packs/day: 0.00    Types: Cigarettes    Quit date: 06/29/2018    Years since quitting: 6.0    Passive exposure: Past   Smokeless tobacco: Never   Tobacco comments:    She smoked 10 cigarettes a day for 15 years ,  quit 2019.   Vaping Use   Vaping status: Never Used  Substance and Sexual Activity   Alcohol use: Yes    Comment: occa   Drug use: No   Sexual activity: Not Currently  Other Topics Concern   Not on file  Social History Narrative   Lives with her son.    Social Drivers of Corporate Investment Banker Strain: Not on file  Food Insecurity: No Food Insecurity (03/12/2024)   Hunger Vital Sign    Worried About Running Out of Food in the Last Year: Never true    Ran Out of Food in the Last Year: Never true  Transportation Needs: No Transportation Needs (03/12/2024)   PRAPARE - Administrator, Civil Service (Medical): No    Lack of Transportation (Non-Medical): No  Physical Activity: Not on file  Stress: Not on file  Social Connections: Not on file    Family History  Problem Relation Age of Onset   Diabetes Mother    Hypertension Mother    Asthma Brother    Breast cancer Maternal Grandmother    Asthma Son    Colon cancer Neg Hx  Cancer - Lung Neg Hx    Cervical cancer Neg Hx     Outpatient Encounter Medications as of 07/03/2024  Medication Sig   Accu-Chek Softclix Lancets lancets USE TO CHECK BLOOD GLUCOSE FOUR TIMES DAILY AS INSTRUCTED.   acetaminophen  (TYLENOL ) 325 MG tablet Take 2 tablets (650 mg total) by mouth every 4 (four) hours as needed for headache or mild pain (pain score 1-3).   aspirin  EC 81 MG tablet Take 81 mg by mouth daily.   atorvastatin  (LIPITOR) 20 MG tablet Take 1 tablet (20 mg total) by mouth daily.   Blood Glucose Monitoring Suppl (ACCU-CHEK GUIDE ME) w/Device KIT 1 each by Does not apply route as directed. USE TO CHECK BLOOD GLUCOSE FOUR TIMES DAILY AS INSTRUCTED.   clopidogrel  (PLAVIX ) 75 MG tablet Take 1 tablet (75 mg total) by mouth daily with breakfast.   doxycycline  (VIBRA -TABS) 100 MG tablet Take 1 tablet (100 mg total) by mouth 2 (two) times daily.   ferrous sulfate  325 (65 FE) MG tablet Take 1 tablet (325 mg total) by mouth 2 (two)  times daily with a meal.   gabapentin  (NEURONTIN ) 300 MG capsule Take 1 capsule (300 mg total) by mouth 2 (two) times daily.   glipiZIDE  (GLUCOTROL  XL) 5 MG 24 hr tablet Take 1 tablet (5 mg total) by mouth daily with breakfast.   glucose blood (ACCU-CHEK GUIDE TEST) test strip USE TO CHECK BLOOD GLUCOSE FOUR TIMES DAILY AS INSTRUCTED   oxyCODONE  (OXY IR/ROXICODONE ) 5 MG immediate release tablet Take 1 tablet (5 mg total) by mouth every 6 (six) hours as needed for severe pain (pain score 7-10).   [DISCONTINUED] insulin  glargine-yfgn (SEMGLEE ) 100 UNIT/ML Pen Inject 20 Units into the skin at bedtime.   insulin  glargine-yfgn (SEMGLEE ) 100 UNIT/ML Pen Inject 20 Units into the skin at bedtime.   [DISCONTINUED] insulin  glargine (LANTUS  SOLOSTAR) 100 UNIT/ML Solostar Pen Inject 24 Units into the skin at bedtime.   No facility-administered encounter medications on file as of 07/03/2024.    ALLERGIES: Allergies  Allergen Reactions   Amoxicillin Other (See Comments)    Unknown    Shellfish Allergy Other (See Comments)    Unknown     VACCINATION STATUS: Immunization History  Administered Date(s) Administered   Hepatitis B, ADULT 08/06/2019, 09/11/2019   Influenza,inj,Quad PF,6+ Mos 04/21/2022   Influenza,inj,quad, With Preservative 08/01/2019   Influenza-Unspecified 04/29/2021   Moderna Sars-Covid-2 Vaccination 04/02/2020   PFIZER(Purple Top)SARS-COV-2 Vaccination 08/01/2020   PPD Test 04/08/2020, 11/22/2021   Tdap 04/10/2015, 08/01/2019    Diabetes She presents for her follow-up diabetic visit. She has type 2 diabetes mellitus. Onset time: She was diagnosed at approximate age of 32 years.  She did have a gestational diabetes during her first pregnancy 19 years ago. Her disease course has been improving (Her diabetes treatment course is complicated by nonadherence for follow-up.  She missed her appointment since November 2023.). There are no hypoglycemic associated symptoms. Pertinent  negatives for hypoglycemia include no confusion, headaches, pallor or seizures. Associated symptoms include foot ulcerations and weight loss. Pertinent negatives for diabetes include no blurred vision, no chest pain, no fatigue, no polydipsia, no polyphagia and no polyuria. There are no hypoglycemic complications. Symptoms are improving. Diabetic complications include peripheral neuropathy and PVD. Risk factors for coronary artery disease include diabetes mellitus and dyslipidemia. Her weight is fluctuating minimally. She is following a generally unhealthy diet. When asked about meal planning, she reported none. She has not had a previous visit with a dietitian. She  rarely participates in exercise. Her home blood glucose trend is decreasing steadily. Her breakfast blood glucose range is generally 140-180 mg/dl. Her dinner blood glucose range is generally 180-200 mg/dl. Her bedtime blood glucose range is generally 180-200 mg/dl. Her overall blood glucose range is 180-200 mg/dl. (She presents with no meter , logs until end of October 2025 with trend of improvement.  She did not document hypoglycemia.  Her point-of-care A1c is 9.6% improving from 12.6% during her last visit.   She denies any hypoglycemia.   ) An ACE inhibitor/angiotensin II receptor blocker is not being taken. Eye exam is not current.  Hyperlipidemia This is a chronic problem. The current episode started more than 1 year ago. The problem is uncontrolled. Exacerbating diseases include diabetes. Pertinent negatives include no chest pain, myalgias or shortness of breath. Current antihyperlipidemic treatment includes statins. Risk factors for coronary artery disease include diabetes mellitus, dyslipidemia and family history.     Objective:       07/03/2024    1:41 PM 07/03/2024    1:35 PM 04/24/2024    9:06 AM  Vitals with BMI  Height  5' 2 5' 2  Weight  139 lbs 143 lbs 3 oz  BMI  25.42 26.18  Systolic 140 156 831  Diastolic 80 80 94   Pulse 92  84    BP (!) 140/80   Pulse 92   Resp 18   Ht 5' 2 (1.575 m)   Wt 139 lb (63 kg)   SpO2 99%   BMI 25.42 kg/m   Wt Readings from Last 3 Encounters:  07/03/24 139 lb (63 kg)  04/24/24 143 lb 3.2 oz (65 kg)  03/15/24 (P) 133 lb 9.6 oz (60.6 kg)    On exam today: Right foot ulcer on lateral side including the little toe which looks gangrenous, will repeat suspicious for deep cellulitis/osteomyelitis    CMP ( most recent) CMP     Component Value Date/Time   NA 137 03/16/2024 0313   NA 130 (L) 11/15/2023 1152   K 4.6 03/16/2024 0313   CL 109 03/16/2024 0313   CO2 23 03/16/2024 0313   GLUCOSE 194 (H) 03/16/2024 0313   BUN 17 03/16/2024 0313   BUN 29 (H) 11/15/2023 1152   CREATININE 1.16 (H) 03/16/2024 0313   CREATININE 0.66 05/19/2020 0750   CALCIUM  8.2 (L) 03/16/2024 0313   PROT 5.4 (L) 03/13/2024 0438   PROT 6.5 11/15/2023 1152   ALBUMIN 2.1 (L) 03/14/2024 0605   ALBUMIN 4.0 11/15/2023 1152   AST 19 03/13/2024 0438   ALT 18 03/13/2024 0438   ALKPHOS 80 03/13/2024 0438   BILITOT 0.2 03/13/2024 0438   BILITOT 0.2 11/15/2023 1152   GFRNONAA 59 (L) 03/16/2024 0313   GFRNONAA 109 05/19/2020 0750   GFRAA 126 05/19/2020 0750     Diabetic Labs (most recent): Lab Results  Component Value Date   HGBA1C 9.6 (A) 07/03/2024   HGBA1C 12.6 (A) 03/11/2024   HGBA1C 15.0 (H) 11/15/2023   MICROALBUR 150 10/20/2020   Recent Results (from the past 2160 hours)  VAS US  ABI WITH/WO TBI     Status: None   Collection Time: 04/22/24 12:07 PM  Result Value Ref Range   Right ABI 1.05    Left ABI 0.98   POCT glycosylated hemoglobin (Hb A1C)     Status: Abnormal   Collection Time: 07/03/24  1:46 PM  Result Value Ref Range   Hemoglobin A1C 9.6 (A) 4.0 - 5.6 %  HbA1c POC (<> result, manual entry)     HbA1c, POC (prediabetic range)     HbA1c, POC (controlled diabetic range)      Lipid Panel     Component Value Date/Time   CHOL 117 03/15/2024 0319   CHOL 153  09/22/2022 0900   TRIG 103 03/15/2024 0319   HDL 44 03/15/2024 0319   HDL 63 09/22/2022 0900   CHOLHDL 2.7 03/15/2024 0319   VLDL 21 03/15/2024 0319   LDLCALC 52 03/15/2024 0319   LDLCALC 78 09/22/2022 0900   LDLCALC 60 05/19/2020 0750   LABVLDL 12 09/22/2022 0900     Assessment & Plan:   1. Uncontrolled type 2 diabetes mellitus with hyperglycemia-complicated by diabetic foot ulcer  - Laguana Janes has currently uncontrolled symptomatic type 2 DM since 48 years of age.  She presents with no meter , logs until end of October 2025 with trend of improvement.  She did not document hypoglycemia.  Her point-of-care A1c is 9.6% improving from 12.6% during her last visit.   She denies any hypoglycemia.    - Recent labs reviewed. - I had a long discussion with her about the progressive nature of diabetes and the pathology behind its complications.  She is also at high risk for CAD, CVA, CKD, retinopathy, and neuropathy. These are all discussed in detail with her. - I have counseled her on diet  and weight management  by adopting a carbohydrate restricted/protein rich diet. Patient is encouraged to switch to  unprocessed or minimally processed   complex starch and increased protein intake (animal or plant source), fruits, and vegetables. -  she is advised to stick to a routine mealtimes to eat 3 meals  a day and avoid unnecessary snacks ( to snack only to correct hypoglycemia).  - she acknowledges that there is a room for improvement in her food and drink choices.  - Suggestion is made for her to avoid simple carbohydrates  from her diet including Cakes, Sweet Desserts, Ice Cream, Soda (diet and regular), Sweet Tea, Candies, Chips, Cookies, Store Bought Juices, Alcohol in Excess of  1-2 drinks a day, Artificial Sweeteners,  Coffee Creamer, and Sugar-free Products, Lemonade. This will help patient to have more stable blood glucose profile and potentially avoid unintended weight gain.  -  Based on her glycemic response when she was following instructions, she would not need prandial insulin  for now.  She is advised to continue Semglee  20 units nightly associated with monitoring of blood glucose at least twice a day-daily before breakfast and at bedtime.  A glucometer with strips were prescribed for her since she is sharing testing supplies with her husband currently.  She is also advised to continue glipizide  5 mg XL p.o. daily at breakfast.   - she is encouraged to call clinic for blood glucose levels less than 70 or above 200 mg /dl. - She Will have greatly benefited from a CGM, however she did not fill this prescription.     - Specific targets for  A1c;  LDL, HDL,  and Triglycerides were discussed with the patient.  2) Blood Pressure /Hypertension:  Her blood pressure is not controlled.  She has questionable compliance with her medications. -She is advised to continue lisinopril  20 mg p.o. daily  at breakfast.  She also has urine microalbuminuria.     3) Lipids/Hyperlipidemia:   Review of her recent lipid panel showed controlled lipid panel with LDL at 52.    She is advised to continue atorvastatin   20 mg p.o. nightly.    Side effects and precautions discussed with her.     4)  Weight/Diet:  Body mass index is 25.42 kg/m.  She is not a candidate for weight loss.  Hence, she is not a candidate for GLP-1 receptor agonist. 5) Chronic Care/Health Maintenance:  -she  is on Statin medications and  is encouraged to initiate and continue to follow up with Ophthalmology, Dentist,  Podiatrist at least yearly or according to recommendations, and advised to   stay away from smoking. I have recommended yearly flu vaccine and pneumonia vaccine at least every 5 years; moderate intensity exercise for up to 150 minutes weekly; and  sleep for at least 7 hours a day.  - she is  advised to maintain close follow up with [place], Meade PEDLAR, FNP for primary care needs, as well as her other  providers for optimal and coordinated care.   I spent  26  minutes in the care of the patient today including review of labs from CMP, Lipids, Thyroid Function, Hematology (current and previous including abstractions from other facilities); face-to-face time discussing  her blood glucose readings/logs, discussing hypoglycemia and hyperglycemia episodes and symptoms, medications doses, her options of short and long term treatment based on the latest standards of care / guidelines;  discussion about incorporating lifestyle medicine;  and documenting the encounter. Risk reduction counseling performed per USPSTF guidelines to reduce cardiovascular risk factors.     Please refer to Patient Instructions for Blood Glucose Monitoring and Insulin /Medications Dosing Guide  in media tab for additional information. Please  also refer to  Patient Self Inventory in the Media  tab for reviewed elements of pertinent patient history.  Rhyanna Crofford participated in the discussions, expressed understanding, and voiced agreement with the above plans.  All questions were answered to her satisfaction. she is encouraged to contact clinic should she have any questions or concerns prior to her return visit.   Follow up plan: - Return in about 4 months (around 11/01/2024) for F/U with Pre-visit Labs, Meter/CGM/Logs, A1c here.  Ranny Earl, MD [facility] [address] [place], [place] [address] Phone: [phone]  Fax: [phone]    07/03/2024, 2:17 PM  This note was partially dictated with voice recognition software. Similar sounding words can be transcribed inadequately or may not  be corrected upon review.

## 2024-07-18 ENCOUNTER — Ambulatory Visit: Admitting: Podiatry

## 2024-07-18 DIAGNOSIS — G5791 Unspecified mononeuropathy of right lower limb: Secondary | ICD-10-CM

## 2024-07-18 MED ORDER — GABAPENTIN 300 MG PO CAPS: 300.0000 mg | ORAL_CAPSULE | Freq: Two times a day (BID) | ORAL | 3 refills | Status: AC

## 2024-07-18 NOTE — Progress Notes (Signed)
°  Subjective:  Patient ID: Anne Wright, female    DOB: Dec 21, 1975,  MRN: [id number]  Chief Complaint  Patient presents with   Routine Post Op    F/U R 5th toe amputation. Pt. States she is having numbness. IDDM A1C  9.6.     DOS: 03/15/2024 Procedure: Amputation of fifth toe at MPJ level, right foot  48 y.o. female seen for post op check.  Status post right fifth toe amputation well-healed.  Patient reports some numbness in the right foot.  Says it does not hurt.  She was previously on gabapentin  but has been weaned off of it.  Thinks it was better when she was on the gabapentin   Review of Systems: Negative except as noted in the HPI. Denies N/V/F/Ch.   Objective:   Constitutional Well developed. Well nourished.  Vascular Foot warm and well perfused. Capillary refill normal to all digits.   No calf pain with palpation  Neurologic Normal speech. Oriented to person, place, and time. Epicritic sensation diminished  Dermatologic Amputation site right fifth MPJ fully healed   Orthopedic: Status post right fifth toe amputation MPJ level   Radiographs: Status post amputation of the fifth digit at the level of the proximal phalanx  Pathology: A. TOE, RIGHT FIFTH, AMPUTATION:  - Benign bone with mild chronic inflammation  - Benign skin with acute inflammation and ulceration  - Margin appears viable   Micro: MSSA from right fifth toe tissue culture  Assessment:   1. Neuropathy of right foot    Gangrene of right fifth toe status post amputation  Plan:  Patient was evaluated and treated and all questions answered.  s/p right fifth toe amputation at MPJ level # Neuropathy right foot - Discussed with patient normal to have some numbness and tingling likely this is related to her diabetes and peripheral neuropathy.  She does not experience much pain but does think that gabapentin  was previously helpful for her - Recommend she go back on the gabapentin  300 mg twice daily as  needed for right foot neuropathy.  Discussed risk benefits - She will follow-up in 6 weeks to recheck to see how the medication is helping her. - No restrictions in regard to the right fifth toe amputation site fully healed no evidence of residual infection        Anne Wright, DPM [facility] / [facility]

## 2024-08-29 ENCOUNTER — Ambulatory Visit (INDEPENDENT_AMBULATORY_CARE_PROVIDER_SITE_OTHER): Admitting: Podiatry

## 2024-08-29 DIAGNOSIS — Z91199 Patient's noncompliance with other medical treatment and regimen due to unspecified reason: Secondary | ICD-10-CM

## 2024-08-29 NOTE — Progress Notes (Signed)
 NS for apt, does not need rs

## 2024-10-21 ENCOUNTER — Encounter (HOSPITAL_COMMUNITY)

## 2024-10-21 ENCOUNTER — Ambulatory Visit

## 2024-11-06 ENCOUNTER — Ambulatory Visit: Admitting: "Endocrinology
